# Patient Record
Sex: Female | Born: 1960 | Race: Black or African American | Hispanic: No | Marital: Single | Smoking: Current every day smoker
Health system: Southern US, Community
[De-identification: ages and names within clinical notes are randomized; demographics above are authoritative.]

## PROBLEM LIST (undated history)

## (undated) DIAGNOSIS — E119 Type 2 diabetes mellitus without complications: Secondary | ICD-10-CM

## (undated) DIAGNOSIS — E669 Obesity, unspecified: Secondary | ICD-10-CM

## (undated) DIAGNOSIS — G709 Myoneural disorder, unspecified: Secondary | ICD-10-CM

## (undated) DIAGNOSIS — F32A Depression, unspecified: Secondary | ICD-10-CM

## (undated) DIAGNOSIS — M109 Gout, unspecified: Secondary | ICD-10-CM

## (undated) DIAGNOSIS — J189 Pneumonia, unspecified organism: Secondary | ICD-10-CM

## (undated) DIAGNOSIS — J45909 Unspecified asthma, uncomplicated: Secondary | ICD-10-CM

## (undated) DIAGNOSIS — I10 Essential (primary) hypertension: Secondary | ICD-10-CM

## (undated) DIAGNOSIS — G629 Polyneuropathy, unspecified: Secondary | ICD-10-CM

## (undated) DIAGNOSIS — F329 Major depressive disorder, single episode, unspecified: Secondary | ICD-10-CM

## (undated) DIAGNOSIS — C801 Malignant (primary) neoplasm, unspecified: Secondary | ICD-10-CM

## (undated) DIAGNOSIS — M1990 Unspecified osteoarthritis, unspecified site: Secondary | ICD-10-CM

## (undated) HISTORY — DX: Gout, unspecified: M10.9

## (undated) HISTORY — DX: Major depressive disorder, single episode, unspecified: F32.9

## (undated) HISTORY — PX: MULTIPLE TOOTH EXTRACTIONS: SHX2053

## (undated) HISTORY — DX: Unspecified asthma, uncomplicated: J45.909

## (undated) HISTORY — PX: TONSILLECTOMY: SUR1361

## (undated) HISTORY — PX: FOOT SURGERY: SHX648

## (undated) HISTORY — DX: Essential (primary) hypertension: I10

## (undated) HISTORY — DX: Depression, unspecified: F32.A

## (undated) HISTORY — DX: Unspecified osteoarthritis, unspecified site: M19.90

---

## 1999-01-19 ENCOUNTER — Emergency Department (HOSPITAL_COMMUNITY): Admission: EM | Admit: 1999-01-19 | Discharge: 1999-01-19 | Payer: Self-pay | Admitting: *Deleted

## 2000-03-13 ENCOUNTER — Emergency Department (HOSPITAL_COMMUNITY): Admission: EM | Admit: 2000-03-13 | Discharge: 2000-03-13 | Payer: Self-pay | Admitting: Emergency Medicine

## 2000-03-13 ENCOUNTER — Encounter: Payer: Self-pay | Admitting: Emergency Medicine

## 2000-06-07 ENCOUNTER — Emergency Department (HOSPITAL_COMMUNITY): Admission: EM | Admit: 2000-06-07 | Discharge: 2000-06-07 | Payer: Self-pay | Admitting: Emergency Medicine

## 2000-11-06 ENCOUNTER — Emergency Department (HOSPITAL_COMMUNITY): Admission: EM | Admit: 2000-11-06 | Discharge: 2000-11-06 | Payer: Self-pay | Admitting: Emergency Medicine

## 2001-07-21 ENCOUNTER — Emergency Department (HOSPITAL_COMMUNITY): Admission: EM | Admit: 2001-07-21 | Discharge: 2001-07-21 | Payer: Self-pay | Admitting: Emergency Medicine

## 2001-08-07 ENCOUNTER — Emergency Department (HOSPITAL_COMMUNITY): Admission: EM | Admit: 2001-08-07 | Discharge: 2001-08-07 | Payer: Self-pay | Admitting: Emergency Medicine

## 2001-08-11 ENCOUNTER — Emergency Department (HOSPITAL_COMMUNITY): Admission: EM | Admit: 2001-08-11 | Discharge: 2001-08-11 | Payer: Self-pay | Admitting: Emergency Medicine

## 2002-04-22 ENCOUNTER — Emergency Department (HOSPITAL_COMMUNITY): Admission: EM | Admit: 2002-04-22 | Discharge: 2002-04-22 | Payer: Self-pay | Admitting: Emergency Medicine

## 2003-07-14 ENCOUNTER — Encounter: Admission: RE | Admit: 2003-07-14 | Discharge: 2003-07-14 | Payer: Self-pay | Admitting: Family Medicine

## 2003-07-25 ENCOUNTER — Emergency Department (HOSPITAL_COMMUNITY): Admission: EM | Admit: 2003-07-25 | Discharge: 2003-07-25 | Payer: Self-pay | Admitting: Emergency Medicine

## 2005-08-13 ENCOUNTER — Emergency Department (HOSPITAL_COMMUNITY): Admission: EM | Admit: 2005-08-13 | Discharge: 2005-08-13 | Payer: Self-pay | Admitting: Emergency Medicine

## 2006-08-07 DIAGNOSIS — E669 Obesity, unspecified: Secondary | ICD-10-CM | POA: Insufficient documentation

## 2006-08-07 DIAGNOSIS — J45909 Unspecified asthma, uncomplicated: Secondary | ICD-10-CM | POA: Insufficient documentation

## 2006-08-07 DIAGNOSIS — M1990 Unspecified osteoarthritis, unspecified site: Secondary | ICD-10-CM | POA: Insufficient documentation

## 2006-12-27 ENCOUNTER — Emergency Department (HOSPITAL_COMMUNITY): Admission: EM | Admit: 2006-12-27 | Discharge: 2006-12-27 | Payer: Self-pay | Admitting: Emergency Medicine

## 2007-02-10 ENCOUNTER — Encounter (INDEPENDENT_AMBULATORY_CARE_PROVIDER_SITE_OTHER): Payer: Self-pay | Admitting: *Deleted

## 2007-03-31 IMAGING — CR DG KNEE COMPLETE 4+V*R*
4 series · 4 of 4 positions shown · non-contrast
Comparison: None.

CLINICAL DATA: "Sink fell off wall and struck knee." Anterior right knee pain.

RIGHT KNEE - 4 VIEW  08/13/2005:

[view not recorded (1 of 4)]
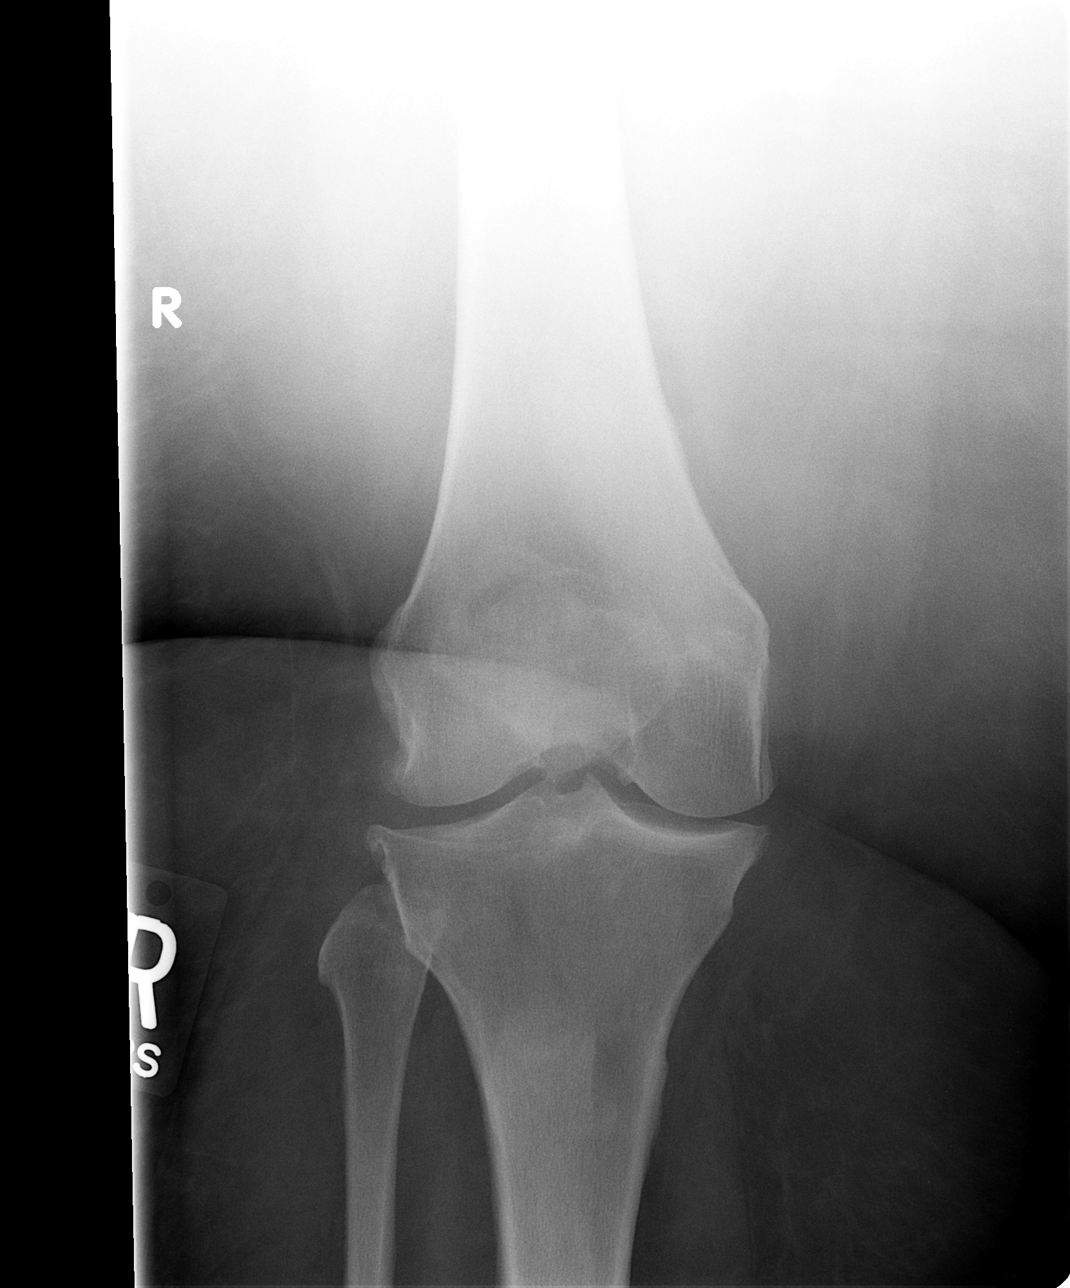

[view not recorded (2 of 4)]
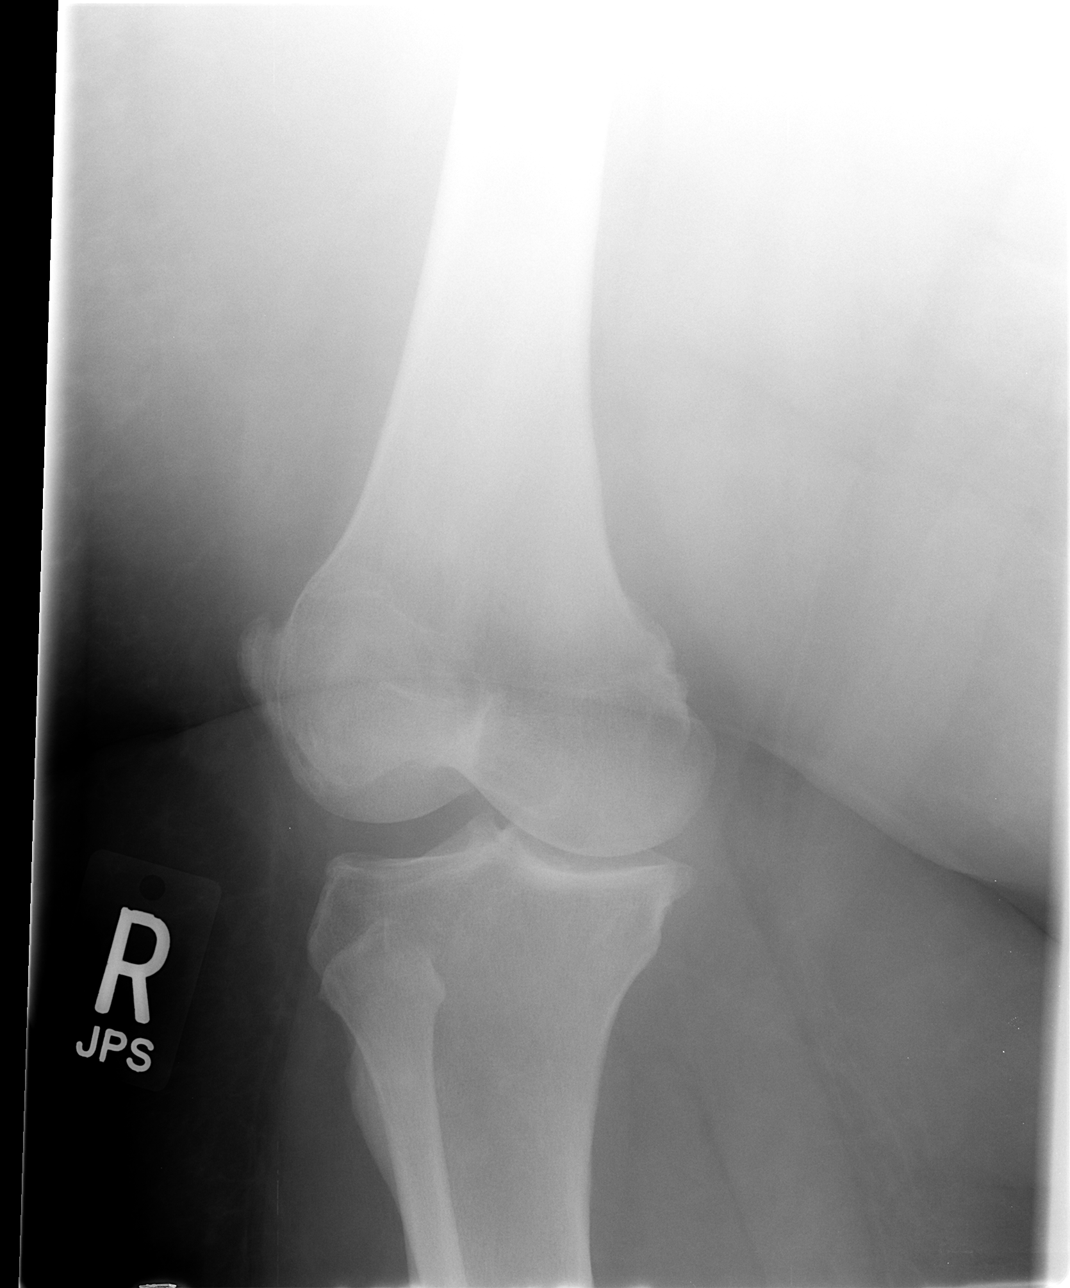

[view not recorded (3 of 4)]
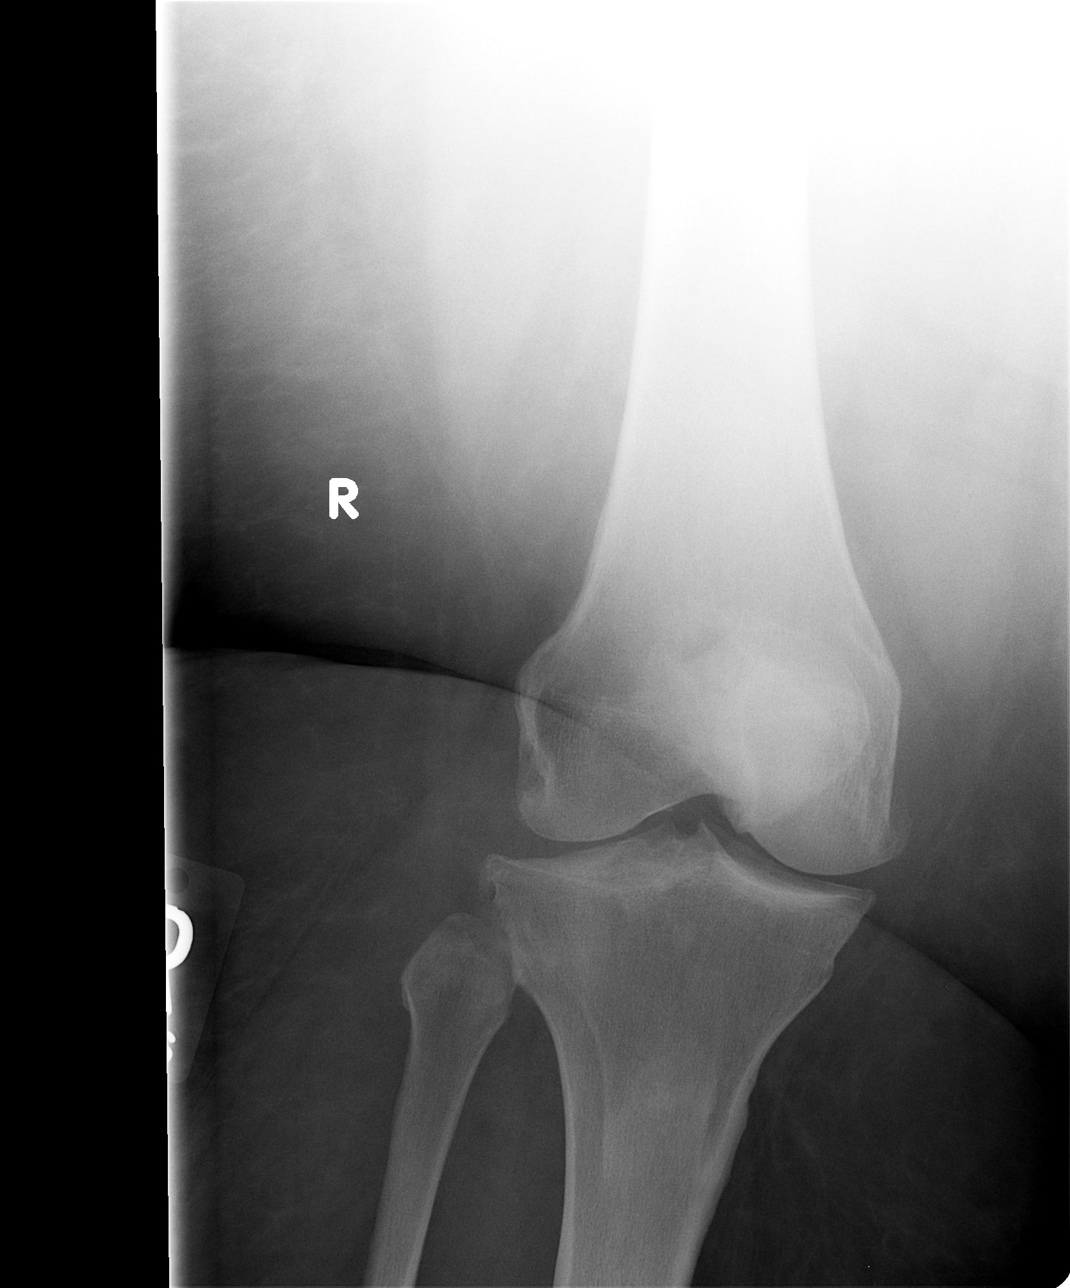

[view not recorded (4 of 4)]
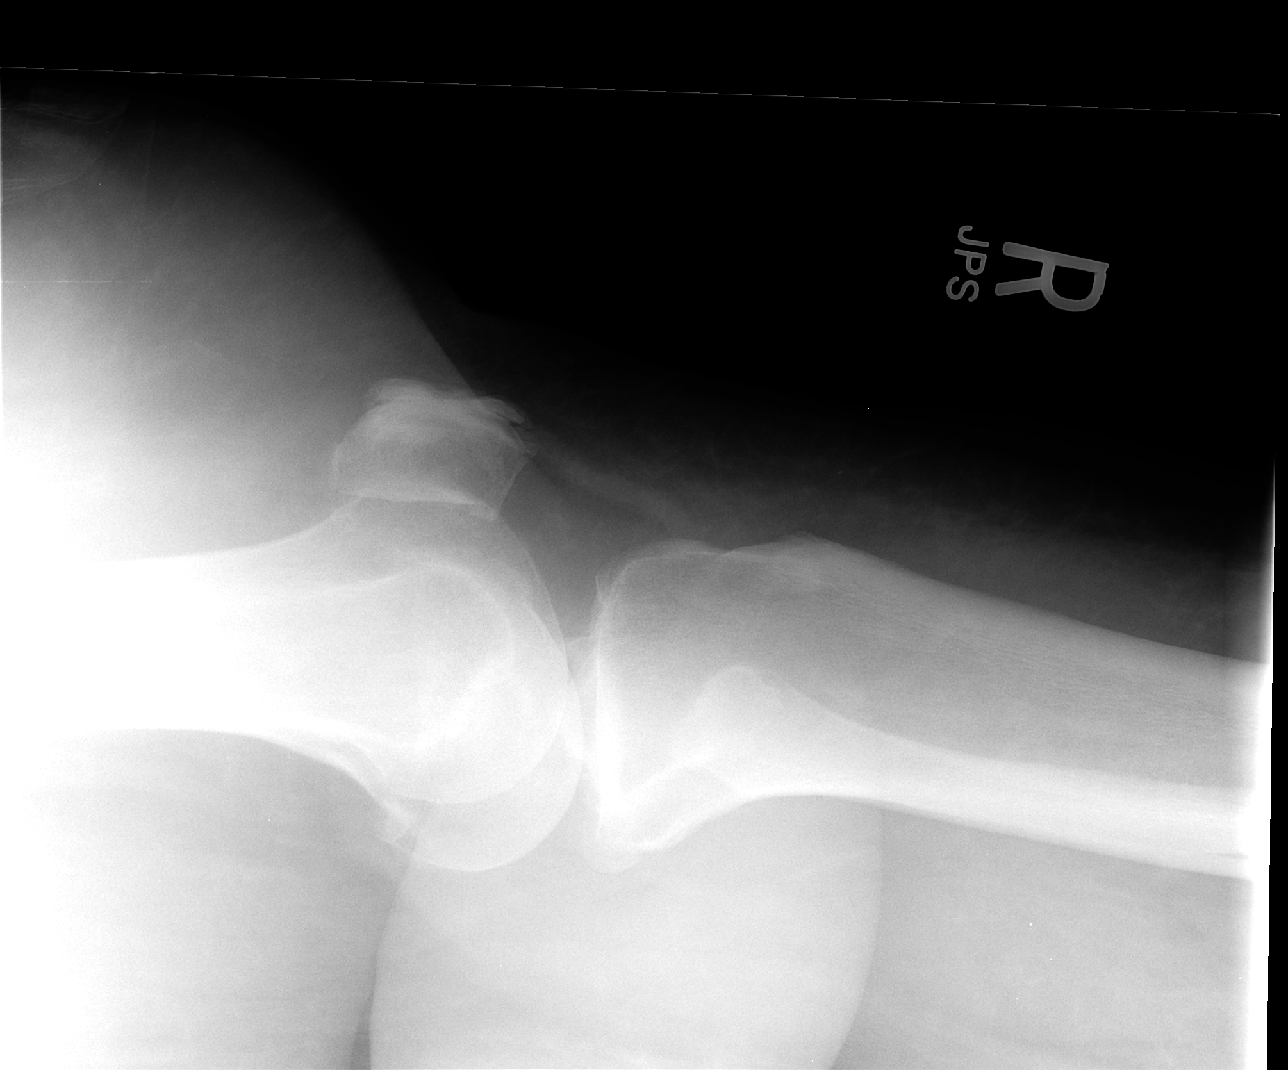

[4 of 4 positions shown; findings below may reference images not displayed]

FINDINGS: There is no evidence of acute fracture or dislocation. Moderate
medial joint space narrowing is present. The lateral and patellofemoral
compartments are well-preserved. There is hypertrophic spurring involving the
medial and lateral compartments. There is ossification at the insertion of the
quadriceps and patellar tendons on the anterior patella. There may be mild
osteopenia for age.
IMPRESSION: No acute skeletal abnormalities. Moderate osteoarthritis, particularly involving
the medial compartment.

## 2008-02-08 ENCOUNTER — Encounter: Payer: Self-pay | Admitting: *Deleted

## 2008-03-02 ENCOUNTER — Emergency Department (HOSPITAL_COMMUNITY): Admission: EM | Admit: 2008-03-02 | Discharge: 2008-03-02 | Payer: Self-pay | Admitting: Emergency Medicine

## 2008-03-07 ENCOUNTER — Telehealth: Payer: Self-pay | Admitting: *Deleted

## 2008-03-21 ENCOUNTER — Ambulatory Visit: Payer: Self-pay | Admitting: Family Medicine

## 2008-03-21 DIAGNOSIS — M549 Dorsalgia, unspecified: Secondary | ICD-10-CM | POA: Insufficient documentation

## 2008-03-21 LAB — CONVERTED CEMR LAB
BUN: 7 mg/dL (ref 6–23)
CO2: 23 meq/L (ref 19–32)
Calcium: 9.1 mg/dL (ref 8.4–10.5)
Chloride: 100 meq/L (ref 96–112)
Creatinine, Ser: 0.81 mg/dL (ref 0.40–1.20)
Glucose, Bld: 164 mg/dL — ABNORMAL HIGH (ref 70–99)
Potassium: 4.6 meq/L (ref 3.5–5.3)
Sodium: 137 meq/L (ref 135–145)

## 2008-03-23 ENCOUNTER — Ambulatory Visit: Payer: Self-pay | Admitting: Family Medicine

## 2008-03-23 ENCOUNTER — Encounter: Payer: Self-pay | Admitting: Family Medicine

## 2008-03-30 ENCOUNTER — Telehealth: Payer: Self-pay | Admitting: Family Medicine

## 2008-04-11 ENCOUNTER — Encounter (INDEPENDENT_AMBULATORY_CARE_PROVIDER_SITE_OTHER): Payer: Self-pay | Admitting: *Deleted

## 2008-04-11 ENCOUNTER — Ambulatory Visit: Payer: Self-pay | Admitting: Family Medicine

## 2008-04-12 ENCOUNTER — Ambulatory Visit: Payer: Self-pay | Admitting: Family Medicine

## 2008-07-11 ENCOUNTER — Ambulatory Visit: Payer: Self-pay | Admitting: Family Medicine

## 2008-07-11 DIAGNOSIS — E119 Type 2 diabetes mellitus without complications: Secondary | ICD-10-CM | POA: Insufficient documentation

## 2008-07-11 LAB — CONVERTED CEMR LAB: Hgb A1c MFr Bld: 7.6 %

## 2008-07-12 ENCOUNTER — Encounter: Payer: Self-pay | Admitting: Family Medicine

## 2008-07-12 LAB — CONVERTED CEMR LAB
ALT: 14 units/L (ref 0–35)
AST: 11 units/L (ref 0–37)
Albumin: 3.5 g/dL (ref 3.5–5.2)
Alkaline Phosphatase: 98 units/L (ref 39–117)
BUN: 6 mg/dL (ref 6–23)
CO2: 29 meq/L (ref 19–32)
Calcium: 9 mg/dL (ref 8.4–10.5)
Chloride: 100 meq/L (ref 96–112)
Cholesterol: 156 mg/dL (ref 0–200)
Creatinine, Ser: 0.69 mg/dL (ref 0.40–1.20)
Glucose, Bld: 169 mg/dL — ABNORMAL HIGH (ref 70–99)
HDL: 41 mg/dL (ref >39)
LDL Cholesterol: 87 mg/dL (ref 0–99)
Potassium: 4.3 meq/L (ref 3.5–5.3)
Sodium: 139 meq/L (ref 135–145)
Total Bilirubin: 0.3 mg/dL (ref 0.3–1.2)
Total CHOL/HDL Ratio: 3.8
Total Protein: 7.3 g/dL (ref 6.0–8.3)
Triglycerides: 138 mg/dL (ref <150)
VLDL: 28 mg/dL (ref 0–40)

## 2008-07-27 ENCOUNTER — Encounter: Admission: RE | Admit: 2008-07-27 | Discharge: 2008-07-27 | Payer: Self-pay | Admitting: Family Medicine

## 2008-08-08 ENCOUNTER — Encounter: Payer: Self-pay | Admitting: Family Medicine

## 2008-10-10 ENCOUNTER — Encounter: Payer: Self-pay | Admitting: *Deleted

## 2008-10-13 ENCOUNTER — Ambulatory Visit: Payer: Self-pay | Admitting: Family Medicine

## 2008-10-13 LAB — CONVERTED CEMR LAB: Hgb A1c MFr Bld: 7.5 %

## 2008-12-01 ENCOUNTER — Ambulatory Visit: Payer: Self-pay | Admitting: Family Medicine

## 2008-12-01 DIAGNOSIS — M79609 Pain in unspecified limb: Secondary | ICD-10-CM | POA: Insufficient documentation

## 2008-12-01 DIAGNOSIS — F17200 Nicotine dependence, unspecified, uncomplicated: Secondary | ICD-10-CM

## 2008-12-26 ENCOUNTER — Encounter: Admission: RE | Admit: 2008-12-26 | Discharge: 2009-03-26 | Payer: Self-pay | Admitting: Family Medicine

## 2008-12-26 ENCOUNTER — Encounter: Payer: Self-pay | Admitting: Family Medicine

## 2009-01-11 ENCOUNTER — Telehealth: Payer: Self-pay | Admitting: Family Medicine

## 2009-01-12 ENCOUNTER — Encounter: Payer: Self-pay | Admitting: Family Medicine

## 2009-01-19 ENCOUNTER — Emergency Department (HOSPITAL_COMMUNITY): Admission: EM | Admit: 2009-01-19 | Discharge: 2009-01-19 | Payer: Self-pay | Admitting: Emergency Medicine

## 2009-04-03 ENCOUNTER — Ambulatory Visit: Payer: Self-pay | Admitting: Family Medicine

## 2009-04-03 LAB — CONVERTED CEMR LAB: Hgb A1c MFr Bld: 7.2 %

## 2009-10-16 ENCOUNTER — Ambulatory Visit (HOSPITAL_COMMUNITY): Admission: RE | Admit: 2009-10-16 | Discharge: 2009-10-16 | Payer: Self-pay | Admitting: Family Medicine

## 2009-10-16 ENCOUNTER — Ambulatory Visit: Payer: Self-pay | Admitting: Family Medicine

## 2009-10-16 DIAGNOSIS — I10 Essential (primary) hypertension: Secondary | ICD-10-CM | POA: Insufficient documentation

## 2009-10-16 LAB — CONVERTED CEMR LAB: Hgb A1c MFr Bld: 6.7 %

## 2009-10-20 ENCOUNTER — Encounter: Payer: Self-pay | Admitting: Family Medicine

## 2010-02-07 ENCOUNTER — Encounter: Payer: Self-pay | Admitting: Family Medicine

## 2010-07-10 NOTE — Miscellaneous (Signed)
   Clinical Lists Changes  Problems: Changed problem from ASTHMA, UNSPECIFIED (ICD-493.90) to ASTHMA, INTERMITTENT (ICD-493.90) 

## 2010-07-10 NOTE — Assessment & Plan Note (Signed)
Summary: f/up,tcb   Vital Signs:  Patient profile:   50 year old female Height:      64 inches Weight:      400.4 pounds BMI:     68.98 Temp:     98.4 degrees F oral Pulse rate:   81 / minute BP sitting:   168 / 91  (left arm) Cuff size:   large  Vitals Entered By: Garen Grams LPN (Oct 16, 1025 11:45 AM)  Serial Vital Signs/Assessments:  Comments: 12:14 PM Manual BP: 131/96 By: Garen Grams LPN   CC: f/u Is Patient Diabetic? Yes Did you bring your meter with you today? No Pain Assessment Patient in pain? yes     Location: legs   CC:  f/u.  History of Present Illness: CHEST PAIN Location: mind chest Description: a indigestion feeling and pressure Onset: no warning.  Never with exertion.  May be related to recent use of Bee Pollen and increased weight. Better with drinking water  Symptoms Trauma: N Nausea/vomiting: N Diaphoresis: N Shortness of breath: N Cough: N Edema: N Orthopnea: N Syncope: N Indigestion: Y  Red Flags Worse with exertion: N Recent Immobility: N Cancer history: N Tearing/radiation to back: N  DIABETES Disease Monitoring Blood Sugar ranges:not checking   Polyuria:N   Visual problems:N  Medications Compliance:off meds for one week   Hypoglycemic symptoms:N  Prevention Exercise:not recently  ROS - as above PMH - Medications reviewed and updated in medication list.  Smoking Status noted in VS form          Habits & Providers  Alcohol-Tobacco-Diet     Tobacco Status: current  Current Medications (verified): 1)  Proair Hfa 108 (90 Base) Mcg/act  Aers (Albuterol Sulfate) .... 2 Inh Q6h As Needed Shortness of Breath 2)  Hydrocortisone 2.5 % Oint (Hydrocortisone) .... Apply Two Times A Day For Itching 45 Gram Tube 3)  Metformin Hcl 500 Mg Tabs (Metformin Hcl) .... Take 1 Tab Twice Daily 4)  Gabapentin 300 Mg  Caps (Gabapentin) .Marland Kitchen.. 1 By Mouth Three Times A Day  Allergies: No Known Drug Allergies  Physical  Exam  General:  Well-developed,well-nourished,in no acute distress; alert,appropriate and cooperative throughout examination very obese Lungs:  Normal respiratory effort, chest expands symmetrically. Lungs are clear to auscultation, no crackles or wheezes. Heart:  Normal rate and regular rhythm. S1 and S2 normal without gallop, murmur, click, rub or other extra sounds. Extremities:  obese.  No edema or focall tender areas    Impression & Recommendations:  Problem # 1:  CHEST PAIN (ICD-786.50) Assessment New  ECG unremarkable.  Most consistent with reflux of indigestion since not exertional helped with water and she has had recent weight gain and using excess chocolate and took Bee Pollen which upset her stomach.  No evidence for coronary artery disease or PE or vascular problems  Orders: [facility]- Est  Level 4 (25366)  Problem # 2:  DIABETES MELLITUS, WITHOUT COMPLICATIONS (ICD-250.00) stable despite her weight gain  Her updated medication list for this problem includes:    Metformin Hcl 500 Mg Tabs (Metformin hcl) .Marland Kitchen... Take 1 tab twice daily  Orders: A1C-[facility] (44034) [facility]- Est  Level 4 (74259)  Labs Reviewed: Creat: 0.69 (07/11/2008)    Reviewed HgBA1c results: 6.7 (10/16/2009)  7.2 (04/03/2009)  Problem # 3:  OBESITY, NOS (ICD-278.00) worsened with recent weight gain when stressed over her family's reaction to her girlfriend.  She aims to get back to weight loss.  Will be visited by  an Aunt who lost > 250 lbs   Problem # 4:  LEG PAIN, BILATERAL (ICD-729.5) was off neurontin and pain worsend.  Will restart   Problem # 5:  ELEVATED BP READING WITHOUT DX HYPERTENSION (ICD-796.2) Assessment: Comment Only has not had hypertension in the past.  Will monitor.  Hopefully with weight loss will resolve.  If does not will need Bmet  BP today: 168/91 Prior BP: 144/90 (04/03/2009)  Labs Reviewed: Creat: 0.69 (07/11/2008) Chol: 156 (07/11/2008)   HDL: 41 (07/11/2008)   LDL: 87  (07/11/2008)   TG: 138 (07/11/2008)  Instructed in low sodium diet (DASH Handout) and behavior modification.    Complete Medication List: 1)  Proair Hfa 108 (90 Base) Mcg/act Aers (Albuterol sulfate) .... 2 inh q6h as needed shortness of breath 2)  Hydrocortisone 2.5 % Oint (Hydrocortisone) .... Apply two times a day for itching 45 gram tube 3)  Metformin Hcl 500 Mg Tabs (Metformin hcl) .... Take 1 tab twice daily 4)  Gabapentin 300 Mg Caps (Gabapentin) .Marland Kitchen.. 1 by mouth three times a day  Other Orders: EKG- [facility] (EKG)  Patient Instructions: 1)  Please schedule a follow-up appointment in 1-2 month.  2)  Next time you have chest pain try mylanta or maalox antiacid.  If having frequenlty try Ranitadine over the counter 75 mg up to two times a day 3)  If you have chest pain with exertion or severe chest pain that does not go away after antiacid or rest more than 20-30 minutes you should call an ambulance 4)  Diet is the key to your health 5)  Colace stool softner (docusate) 6)  Restart all your medications Prescriptions: GABAPENTIN 300 MG  CAPS (GABAPENTIN) 1 by mouth three times a day  #90 x 6   Entered and Authorized by:   Pearlean Brownie MD   Signed by:   Pearlean Brownie MD on 10/16/2009   Method used:   Electronically to        [facility]  [phone]* (retail)       [address] [place], [place]  [address]       Ph: [phone] or [phone]       Fax: [phone]   RxID:   [id number] METFORMIN HCL 500 MG TABS (METFORMIN HCL) Take 1 tab twice daily  #60 x 6   Entered and Authorized by:   Pearlean Brownie MD   Signed by:   Pearlean Brownie MD on 10/16/2009   Method used:   Electronically to        [facility]  [phone]* (retail)       [address] [place], [place]  [address]       Ph: [phone] or [phone]       Fax: [phone]   RxID:   [id number] PROAIR HFA 108 (90 BASE) MCG/ACT  AERS (ALBUTEROL SULFATE) 2 inh q6h as needed  shortness of breath  #1 x 6   Entered and Authorized by:   Pearlean Brownie MD   Signed by:   Pearlean Brownie MD on 10/16/2009   Method used:   Electronically to        [facility]  [phone]* (retail)       [address] [place], [place]  [address]       Ph: [phone] or [phone]       Fax: [phone]  RxID:   [id number]   Laboratory Results   Blood Tests   Date/Time Received: Oct 16, 2009 11:37 AM  Date/Time Reported: Oct 16, 2009 11:50 AM   HGBA1C: 6.7%   (Normal Range: Non-Diabetic - 3-6%   Control Diabetic - 6-8%)  Comments: ...........test performed by...........Marland KitchenGaren Grams, LPN entered by Terese Door, CMA

## 2010-07-12 ENCOUNTER — Encounter: Payer: Self-pay | Admitting: *Deleted

## 2010-10-01 ENCOUNTER — Encounter: Payer: Self-pay | Admitting: Family Medicine

## 2010-10-01 ENCOUNTER — Ambulatory Visit (INDEPENDENT_AMBULATORY_CARE_PROVIDER_SITE_OTHER): Payer: Medicaid Other | Admitting: Family Medicine

## 2010-10-01 VITALS — BP 140/80 | HR 84 | Temp 97.8°F | Ht 63.5 in | Wt >= 6400 oz

## 2010-10-01 DIAGNOSIS — M79609 Pain in unspecified limb: Secondary | ICD-10-CM

## 2010-10-01 DIAGNOSIS — H1010 Acute atopic conjunctivitis, unspecified eye: Secondary | ICD-10-CM | POA: Insufficient documentation

## 2010-10-01 DIAGNOSIS — H1045 Other chronic allergic conjunctivitis: Secondary | ICD-10-CM

## 2010-10-01 DIAGNOSIS — E119 Type 2 diabetes mellitus without complications: Secondary | ICD-10-CM

## 2010-10-01 LAB — COMPREHENSIVE METABOLIC PANEL
Alkaline Phosphatase: 92 U/L (ref 39–117)
Glucose, Bld: 90 mg/dL (ref 70–99)
Sodium: 139 mEq/L (ref 135–145)
Total Bilirubin: 0.2 mg/dL — ABNORMAL LOW (ref 0.3–1.2)
Total Protein: 7.6 g/dL (ref 6.0–8.3)

## 2010-10-01 LAB — POCT GLYCOSYLATED HEMOGLOBIN (HGB A1C): Hemoglobin A1C: 6.8

## 2010-10-01 NOTE — Assessment & Plan Note (Signed)
Her presentation is most consistent with allergeries.   No signs of infection or iritis.    We discussed possible approaches.   Will use OTC oral antihistamine as first line treatment

## 2010-10-01 NOTE — Patient Instructions (Signed)
I will call you if your tests are not good.  Otherwise I will send you a letter.  If you do not hear from me with in 2 weeks please call our office.    Use Clariten (loratadine) 10 mg once a day for allergies Knees - weight control is the key.  Lessening your intake of calories. You need to decide how to achieve that goal.   [facility] has information on gastric bypass

## 2010-10-01 NOTE — Assessment & Plan Note (Signed)
Well controlled. Continue current treatment. 

## 2010-10-01 NOTE — Progress Notes (Signed)
  Subjective:    Patient ID: Deborah Benson, female    DOB: [birth year], 50 y.o.   MRN: [id number]  HPI  Leg Pain Continues but is worsening.  More at rest now when before was with walking.  Mostly in her knees.  No fever or swelling or redness or rash. Taking gabapentin which helps some.   Using tylenol but that does not help much  DIABETES Disease Monitoring: Blood Sugar ranges-not checking Polyuria/phagia/dipsia- no      Visual problems- eyes feel itchy and red Medications: Compliance- daly metformi Hypoglycemic symptoms- no  Eyes Feel itchy and scratchy and are red.  No visual acuity problems.  Has had this for months.  Has not tried any antihistamine.  Has appointment with any eye doctor.    Review of Systems see above  PMH - obesity has been a life long issue since a teenager    Objective:   Physical Exam    Eye - Pupils Equal Round Reactive to light, Extraocular movements intact, Fundi without hemorrhage or visible lesions, Conjunctiva are red and injected peripherally.  No discharge or photophobia Knees - FROM no detectable effusion.        Assessment & Plan:

## 2010-10-01 NOTE — Assessment & Plan Note (Signed)
Her presentation is most consistent with pain due to DJD.   No signs of infection or fracture or inflammatory arthritis.    We discussed possible approaches.  She acknowledges that her weight is the main factor and would like to meet with our health coach and will work to decrease her intake

## 2010-10-02 ENCOUNTER — Other Ambulatory Visit: Payer: Self-pay | Admitting: Family Medicine

## 2010-10-02 ENCOUNTER — Encounter: Payer: Self-pay | Admitting: Family Medicine

## 2010-10-02 ENCOUNTER — Telehealth: Payer: Self-pay | Admitting: Family Medicine

## 2010-10-02 MED ORDER — ACCU-CHEK ACTIVE CARE KIT KIT: 1.0000 | PACK | Freq: Every day | Status: DC

## 2010-10-02 NOTE — Telephone Encounter (Signed)
Pt was seen yesterday, forgot to get rx for glucose machine, would [facility]

## 2010-10-03 NOTE — Telephone Encounter (Signed)
done

## 2010-10-24 ENCOUNTER — Telehealth: Payer: Self-pay | Admitting: Family Medicine

## 2010-10-24 MED ORDER — ALBUTEROL SULFATE HFA 108 (90 BASE) MCG/ACT IN AERS: 2.0000 | INHALATION_SPRAY | Freq: Four times a day (QID) | RESPIRATORY_TRACT | Status: DC | PRN

## 2010-10-24 MED ORDER — METFORMIN HCL 500 MG PO TABS: 500.0000 mg | ORAL_TABLET | Freq: Two times a day (BID) | ORAL | Status: DC

## 2010-10-24 MED ORDER — GABAPENTIN 300 MG PO CAPS: 300.0000 mg | ORAL_CAPSULE | Freq: Three times a day (TID) | ORAL | Status: DC

## 2010-10-24 NOTE — Telephone Encounter (Signed)
Deborah Benson called on 4/24 after her appt day before to have a rx sent for glucometer sent to pharmacy.  According to note this was done the next day.  She contacted pharmacy about refills that also were suppose to have sent on date of service and they didn't have either.  She has been asked to contact them and have them escribe these requests.  Pt agreed to do so.  Giving you a heads up on this.

## 2010-10-24 NOTE — Telephone Encounter (Signed)
Attempted to call but VM was not identifiable as patients, did not leave a message Mudlogger, Maryjo Rochester

## 2010-10-24 NOTE — Telephone Encounter (Signed)
Rx sent to [facility]

## 2010-10-24 NOTE — Telephone Encounter (Signed)
Pt contacted [facility] and they said they could not escribe rxs for Metformin,Gabapentin and the albuterol because pt never had rxs filled there before.  Will need to call or fax rxs to them.  If not write rxs for pt to pick up.

## 2010-10-24 NOTE — Telephone Encounter (Signed)
FYI to MD

## 2010-11-12 ENCOUNTER — Ambulatory Visit: Payer: Medicaid Other | Admitting: Family Medicine

## 2010-11-19 ENCOUNTER — Other Ambulatory Visit: Payer: Self-pay | Admitting: Family Medicine

## 2010-12-07 ENCOUNTER — Ambulatory Visit: Payer: Medicaid Other | Admitting: Family Medicine

## 2011-03-11 LAB — URINALYSIS, ROUTINE W REFLEX MICROSCOPIC
Bilirubin Urine: NEGATIVE
Hgb urine dipstick: NEGATIVE
Nitrite: NEGATIVE
Specific Gravity, Urine: 1.006
pH: 7.5

## 2011-03-11 LAB — PREGNANCY, URINE: Preg Test, Ur: NEGATIVE

## 2011-08-30 ENCOUNTER — Encounter: Payer: Self-pay | Admitting: Family Medicine

## 2011-10-25 ENCOUNTER — Encounter: Payer: Self-pay | Admitting: Family Medicine

## 2011-11-11 ENCOUNTER — Ambulatory Visit (INDEPENDENT_AMBULATORY_CARE_PROVIDER_SITE_OTHER): Payer: Medicaid Other | Admitting: Family Medicine

## 2011-11-11 ENCOUNTER — Encounter: Payer: Self-pay | Admitting: Family Medicine

## 2011-11-11 VITALS — BP 143/87 | HR 92 | Temp 98.5°F | Ht 63.5 in | Wt >= 6400 oz

## 2011-11-11 DIAGNOSIS — G5600 Carpal tunnel syndrome, unspecified upper limb: Secondary | ICD-10-CM

## 2011-11-11 DIAGNOSIS — M79609 Pain in unspecified limb: Secondary | ICD-10-CM

## 2011-11-11 DIAGNOSIS — E119 Type 2 diabetes mellitus without complications: Secondary | ICD-10-CM

## 2011-11-11 LAB — POCT UA - MICROALBUMIN: Creatinine, POC: 100 mg/dL

## 2011-11-11 LAB — POCT GLYCOSYLATED HEMOGLOBIN (HGB A1C): Hemoglobin A1C: 7

## 2011-11-11 NOTE — Assessment & Plan Note (Signed)
New.   Symptoms very consistent with carpal tunnel.  No signs of muscle wasting.   Suggested braces and weight loss

## 2011-11-11 NOTE — Progress Notes (Signed)
  Subjective:    Patient ID: Deborah Benson, female    DOB: [birth year], 51 y.o.   MRN: [id number]  HPI DIABETES Disease Monitoring: Blood Sugar ranges-not checking Polyuria/phagia/dipsia- no      Visual problems- no  Medications: Compliance- daily metformin  Hypoglycemic symptoms- no  Knee pain Feels popping in both legs R > left.  Has pain after they pop.  No locking or soft tissue swelling or falls or acute injury.  No other joint pain.    Hand numbness Awakens from sleep with hand tinglig numb bilaterally.  Better when she shakes them.  No weakness or daytime symptoms.  No injury no other neuro symptoms of weakness or visual changes  Review of Symptoms - see HPI  PMH - Smoking status noted.       Review of Systems     Objective:   Physical Exam Knees - very obese.  No effusion or laxity that I can determine.  Mild pain around patellas Hands - good radial pulse and normal strength and muscle mass and capillary refill Diabetic Foot Check -  Appearance - no lesions, has diffuse callus thickend skin on heel and mid foot bilaterlly  Skin - no unusual pallor or redness  Monofilament testing -  Right - Great toe, medial, central, lateral ball and posterior foot intact Left - Great toe, medial, central, lateral ball and posterior foot intact        Assessment & Plan:

## 2011-11-11 NOTE — Assessment & Plan Note (Signed)
Adequate control.  Continue metformin.

## 2011-11-11 NOTE — Patient Instructions (Signed)
See an eye doctor once a year - ask them to send me letter  Consider a pap smear to detect cervical cancer  Mammogram  I will call you if your tests are not good.  Otherwise I will send you a letter.  If you do not hear from me with in 2 weeks please call our office.     Come back in 3-6 months to check your a1c  Leg exercises for the knees  Wrist splint for the carpal tunnel

## 2011-11-11 NOTE — Assessment & Plan Note (Signed)
Continued pain now in knees.  Certainly weight related but may have component of patella tracking dysfunction.  Suggested strengthening exercises and weight loss

## 2011-11-12 LAB — COMPREHENSIVE METABOLIC PANEL
ALT: 12 U/L (ref 0–35)
AST: 14 U/L (ref 0–37)
CO2: 26 mEq/L (ref 19–32)
Calcium: 9.1 mg/dL (ref 8.4–10.5)
Chloride: 102 mEq/L (ref 96–112)
Sodium: 139 mEq/L (ref 135–145)
Total Bilirubin: 0.3 mg/dL (ref 0.3–1.2)
Total Protein: 8.2 g/dL (ref 6.0–8.3)

## 2011-11-26 ENCOUNTER — Other Ambulatory Visit: Payer: Self-pay | Admitting: Family Medicine

## 2011-11-26 NOTE — Telephone Encounter (Signed)
Patient needs refills on all of her medications including the Test Strips that were sent to [facility] last week.  She has a new pharmacy, [facility] and the # is [phone].  She would like the nurse to call her when the refills have been sent.

## 2011-11-27 MED ORDER — ALBUTEROL SULFATE HFA 108 (90 BASE) MCG/ACT IN AERS: 2.0000 | INHALATION_SPRAY | Freq: Four times a day (QID) | RESPIRATORY_TRACT | Status: DC | PRN

## 2011-11-27 MED ORDER — GABAPENTIN 300 MG PO CAPS: 300.0000 mg | ORAL_CAPSULE | Freq: Every day | ORAL | Status: DC

## 2011-11-27 MED ORDER — METFORMIN HCL 500 MG PO TABS: 500.0000 mg | ORAL_TABLET | Freq: Two times a day (BID) | ORAL | Status: DC

## 2011-11-27 MED ORDER — GLUCOSE BLOOD VI STRP: ORAL_STRIP | Status: DC

## 2011-11-27 MED ORDER — ACCU-CHEK ACTIVE CARE KIT KIT: 1.0000 | PACK | Freq: Every day | Status: DC

## 2011-11-27 NOTE — Telephone Encounter (Signed)
Done. thanks

## 2011-11-27 NOTE — Telephone Encounter (Signed)
Pt is going out of town today and can't leave with out these strips.

## 2011-11-27 NOTE — Telephone Encounter (Signed)
lvm for patient that Rx's have been sent to pharmacy.

## 2011-11-27 NOTE — Telephone Encounter (Signed)
Patient is calling back to check on this.  She is on her way out of town and needs her meds before she goes.

## 2012-03-18 ENCOUNTER — Ambulatory Visit (INDEPENDENT_AMBULATORY_CARE_PROVIDER_SITE_OTHER): Payer: Medicaid Other | Admitting: Family Medicine

## 2012-03-18 ENCOUNTER — Encounter: Payer: Self-pay | Admitting: Family Medicine

## 2012-03-18 VITALS — BP 156/87 | HR 83 | Temp 98.3°F | Ht 63.5 in | Wt >= 6400 oz

## 2012-03-18 DIAGNOSIS — Z23 Encounter for immunization: Secondary | ICD-10-CM

## 2012-03-18 DIAGNOSIS — E669 Obesity, unspecified: Secondary | ICD-10-CM

## 2012-03-18 DIAGNOSIS — M1990 Unspecified osteoarthritis, unspecified site: Secondary | ICD-10-CM

## 2012-03-18 DIAGNOSIS — E119 Type 2 diabetes mellitus without complications: Secondary | ICD-10-CM

## 2012-03-18 NOTE — Patient Instructions (Addendum)
You need Flu Shot Mammogram Colon Cancer screening - colonoscopy or stool cards Pap Smear - make an appointment in 3 months   [facility] - for information on Lapband or bypass  Your main goal is weight loss

## 2012-03-18 NOTE — Assessment & Plan Note (Signed)
Stable Continue medications and hopeflly weight loss

## 2012-03-18 NOTE — Assessment & Plan Note (Signed)
Weight related with component of patella dysfunction.  Can't recommend any treatments other than weight control at present time.  She understands

## 2012-03-18 NOTE — Assessment & Plan Note (Signed)
Worsening.  Discussed approaches to intake control.  Suggested diet, nutritionist and consider surgery

## 2012-03-18 NOTE — Progress Notes (Signed)
  Subjective:    Patient ID: Deborah Benson, female    DOB: [birth year], 51 y.o.   MRN: [id number]  HPI  Knee ankle pain Continued pain especially iwht walking.  Feels things slip out of place.  No redness or focal swelling or fevers.  Has gained weight.  Not able to do quad exercises  DIABETES Disease Monitoring: Blood Sugar ranges-not checking Polyuria/phagia/dipsia- no      Visual problems- no  Medications: Compliance- daily metformin Hypoglycemic symptoms- no  Obesity Has gained weight.  Unable to exercise due to knee pain.  Not decreasing intake much.  Feel hungry all the timed.  Did lose > 25 lbs last year.  Last saw Dr Gerilyn Pilgrim about a year ago.  Consdering surgery for weight control.  Review of Symptoms - see HPI  PMH - Smoking status noted.      Review of Systems     Objective:   Physical Exam Knees - morbidly obese.  Reasonable range of motion of hips and knees.  No discernable effusion Unable to track patella very well  Ankles - no definite effuision.  Focally tender over left lateral mid foot without deformity or crepitus       Assessment & Plan:

## 2012-05-14 ENCOUNTER — Other Ambulatory Visit: Payer: Self-pay | Admitting: Family Medicine

## 2012-07-10 ENCOUNTER — Other Ambulatory Visit: Payer: Self-pay | Admitting: Family Medicine

## 2012-07-27 ENCOUNTER — Ambulatory Visit (INDEPENDENT_AMBULATORY_CARE_PROVIDER_SITE_OTHER): Payer: Medicaid Other | Admitting: Family Medicine

## 2012-07-27 ENCOUNTER — Encounter: Payer: Self-pay | Admitting: Family Medicine

## 2012-07-27 VITALS — BP 143/81 | HR 91 | Temp 98.1°F | Ht 63.5 in | Wt >= 6400 oz

## 2012-07-27 DIAGNOSIS — E669 Obesity, unspecified: Secondary | ICD-10-CM

## 2012-07-27 DIAGNOSIS — Z1211 Encounter for screening for malignant neoplasm of colon: Secondary | ICD-10-CM

## 2012-07-27 DIAGNOSIS — E119 Type 2 diabetes mellitus without complications: Secondary | ICD-10-CM

## 2012-07-27 DIAGNOSIS — M1990 Unspecified osteoarthritis, unspecified site: Secondary | ICD-10-CM

## 2012-07-27 LAB — POCT GLYCOSYLATED HEMOGLOBIN (HGB A1C): Hemoglobin A1C: 7

## 2012-07-27 MED ORDER — GABAPENTIN (ONCE-DAILY) 600 MG PO TABS: 1.0000 | ORAL_TABLET | Freq: Every day | ORAL | Status: DC

## 2012-07-27 NOTE — Assessment & Plan Note (Signed)
Stable.  Discussed best therapy is significant weight loss

## 2012-07-27 NOTE — Assessment & Plan Note (Signed)
Well controlled 

## 2012-07-27 NOTE — Progress Notes (Signed)
  Subjective:    Patient ID: Deborah Benson, female    DOB: June 23, 1960, 52 y.o.   MRN: [id number]  HPI  DIABETES Disease Monitoring: Blood Sugar ranges-not checking Polyuria/phagia/dipsia- no      Visual problems- no Medications: Compliance- daily metformin Hypoglycemic symptoms- no  Obesity Has not been able to lose much weight but has not gained.  Is interested in weight loss surgery but her mom is against it.  She feels she has done all she can to lose weight on her own.  Understands her leg pain will only worsen  Leg Foot pain About the same.  Pain in her lateral feet.  Not using insoles. No sores or soft tissue swelling or redness beter with rest  Review of Systems     Objective:   Physical Exam Alert very obese Heart - Regular rate and rhythm.  No murmurs, gallops or rubs.    Lungs:  Normal respiratory effort, chest expands symmetrically. Lungs are clear to auscultation, no crackles or wheezes. Diabetic Foot Check -  Appearance - no lesions, ulcers or calluses Skin - no unusual pallor or redness Monofilament testing -  Right - Great toe, medial, central, lateral ball and posterior foot intact Left - Great toe, medial, central, lateral ball and posterior foot intact        Assessment & Plan:

## 2012-07-27 NOTE — Assessment & Plan Note (Signed)
Unchanged.  Her primay problem.  Encouraged exploration of weight loss surgery

## 2012-07-27 NOTE — Patient Instructions (Addendum)
See your eye doctor ask them to send me a report   Send in your stool cards  You need a pap smear to prevent cervical cancer   Contact [facility] about weight loss surgery  Come back in June for a check up

## 2012-08-04 ENCOUNTER — Other Ambulatory Visit (HOSPITAL_COMMUNITY): Payer: Self-pay | Admitting: Emergency Medicine

## 2012-08-04 DIAGNOSIS — Z1231 Encounter for screening mammogram for malignant neoplasm of breast: Secondary | ICD-10-CM

## 2012-08-10 ENCOUNTER — Ambulatory Visit (HOSPITAL_COMMUNITY): Payer: Medicaid Other | Attending: Emergency Medicine

## 2012-08-21 ENCOUNTER — Ambulatory Visit (HOSPITAL_COMMUNITY): Payer: Medicaid Other | Attending: Emergency Medicine

## 2012-08-27 LAB — POC HEMOCCULT BLD/STL (HOME/3-CARD/SCREEN): Card #2 Fecal Occult Blod, POC: NEGATIVE

## 2012-08-27 NOTE — Addendum Note (Signed)
Addended by: [place], Rosali Augello on: 08/27/2012 05:25 PM   Modules accepted: Orders

## 2012-09-12 ENCOUNTER — Other Ambulatory Visit: Payer: Self-pay | Admitting: Family Medicine

## 2012-12-17 ENCOUNTER — Other Ambulatory Visit: Payer: Self-pay

## 2013-01-25 ENCOUNTER — Ambulatory Visit
Admission: RE | Admit: 2013-01-25 | Discharge: 2013-01-25 | Disposition: A | Discharge status: Home / Self Care | Payer: Medicaid Other | Source: Ambulatory Visit | Attending: Emergency Medicine | Admitting: Emergency Medicine

## 2013-01-25 DIAGNOSIS — Z1231 Encounter for screening mammogram for malignant neoplasm of breast: Secondary | ICD-10-CM

## 2013-02-08 ENCOUNTER — Other Ambulatory Visit: Payer: Self-pay | Admitting: Family Medicine

## 2013-03-08 ENCOUNTER — Ambulatory Visit (INDEPENDENT_AMBULATORY_CARE_PROVIDER_SITE_OTHER): Payer: Medicaid Other | Admitting: Family Medicine

## 2013-03-08 ENCOUNTER — Encounter: Payer: Self-pay | Admitting: Family Medicine

## 2013-03-08 VITALS — BP 144/84 | HR 82 | Temp 98.5°F | Ht 63.5 in | Wt >= 6400 oz

## 2013-03-08 DIAGNOSIS — E119 Type 2 diabetes mellitus without complications: Secondary | ICD-10-CM

## 2013-03-08 DIAGNOSIS — Z23 Encounter for immunization: Secondary | ICD-10-CM

## 2013-03-08 DIAGNOSIS — G4700 Insomnia, unspecified: Secondary | ICD-10-CM | POA: Insufficient documentation

## 2013-03-08 DIAGNOSIS — F17200 Nicotine dependence, unspecified, uncomplicated: Secondary | ICD-10-CM

## 2013-03-08 MED ORDER — GABAPENTIN 300 MG PO CAPS: 600.0000 mg | ORAL_CAPSULE | Freq: Every day | ORAL | Status: DC

## 2013-03-08 NOTE — Assessment & Plan Note (Signed)
Seems related to depressive symptoms which have been present about 1 month.  Discussed possible interventions and she would like to see how it goes and work on sleep hygiene.  Givne her weight sleep apnea needs to be considered if does not resolve

## 2013-03-08 NOTE — Assessment & Plan Note (Signed)
Cutting down - using Medtr[employer]

## 2013-03-08 NOTE — Assessment & Plan Note (Signed)
Well controlled 

## 2013-03-08 NOTE — Patient Instructions (Addendum)
Nice to see you today  Please ask your eye doctor to send me a report  Use Terbenafine cream (Lamisil) twice daily on your right foot for 6 weeks  Work hard on a sleep schedule - same time to bed and get up every day and no naps  Regular exercise but not just before bed  If your mood and sleep is not better in 3 weeks or if worsening come back  You need a pap smear   Great work with the decreasing smoking - your quit date is January 2015  Come back in 3-6 mo

## 2013-03-08 NOTE — Progress Notes (Signed)
  Subjective:    Patient ID: Deborah Benson, female    DOB: [birth year], 52 y.o.   MRN: [id number]  HPI  DIABETES Disease Monitoring: Blood Sugar ranges-not checking Polyuria/phagia/dipsia- no      Visual problems- no Medications: Compliance- daily metformin Hypoglycemic symptoms- no  Feeling Down Insomnia For last month having early morning awakening, feeling low, anhedonia (see PHQ-9) Started when her son moved out with hisfamily which had been very stressful.  No suicidal ideation. Does snore.  Not taking any otc medications, or drug use or alcohol.    ROS See HPI above   PMH Smoking Status noted  Lab Review   Potassium  Date Value Range Status  11/11/2011 4.3  3.5 - 5.3 mEq/L Final     Sodium  Date Value Range Status  11/11/2011 139  135 - 145 mEq/L Final     Creat  Date Value Range Status  11/11/2011 0.68  0.50 - 1.10 mg/dL Final     Creatinine, Ser  Date Value Range Status  07/11/2008 0.69  0.40-1.20 mg/dL Final      Review of Symptoms - see HPI  PMH - Smoking status noted.      Review of Systems     Objective:   Physical Exam  Alert interactive well groomed Heart - Regular rate and rhythm.  No murmurs, gallops or rubs.    Lungs:  Normal respiratory effort, chest expands symmetrically. Lungs are clear to auscultation, no crackles or wheezes. Feet - area of flaking cracked skin on poster right foot Diabetic Foot Check -  Skin - no unusual pallor or redness Monofilament testing  Right - Great toe, medial, central, lateral ball and posterior foot intact Left - Great toe, medial, central, lateral ball and posterior foot intact       Assessment & Plan:

## 2013-03-12 ENCOUNTER — Other Ambulatory Visit: Payer: Self-pay | Admitting: Family Medicine

## 2013-03-23 ENCOUNTER — Telehealth: Payer: Self-pay | Admitting: *Deleted

## 2013-03-23 NOTE — Telephone Encounter (Signed)
Called and gave NPI number to [facility] for upcoming appointment for Deborah Benson

## 2013-08-11 ENCOUNTER — Other Ambulatory Visit: Payer: Self-pay | Admitting: Family Medicine

## 2013-08-11 ENCOUNTER — Ambulatory Visit (INDEPENDENT_AMBULATORY_CARE_PROVIDER_SITE_OTHER): Payer: Medicaid Other | Admitting: Family Medicine

## 2013-08-11 ENCOUNTER — Encounter: Payer: Self-pay | Admitting: Family Medicine

## 2013-08-11 VITALS — BP 153/86 | HR 71 | Temp 98.0°F | Wt >= 6400 oz

## 2013-08-11 DIAGNOSIS — E119 Type 2 diabetes mellitus without complications: Secondary | ICD-10-CM

## 2013-08-11 DIAGNOSIS — K5900 Constipation, unspecified: Secondary | ICD-10-CM

## 2013-08-11 LAB — LIPID PANEL
Cholesterol: 153 mg/dL (ref 0–200)
HDL: 46 mg/dL (ref >39)
LDL Cholesterol: 84 mg/dL (ref 0–99)
Total CHOL/HDL Ratio: 3.3 Ratio
Triglycerides: 116 mg/dL (ref <150)
VLDL: 23 mg/dL (ref 0–40)

## 2013-08-11 LAB — BASIC METABOLIC PANEL
BUN: 7 mg/dL (ref 6–23)
CALCIUM: 9.2 mg/dL (ref 8.4–10.5)
CO2: 32 meq/L (ref 19–32)
CREATININE: 0.66 mg/dL (ref 0.50–1.10)
Chloride: 99 mEq/L (ref 96–112)
GLUCOSE: 114 mg/dL — AB (ref 70–99)
Potassium: 4.2 mEq/L (ref 3.5–5.3)
SODIUM: 137 meq/L (ref 135–145)

## 2013-08-11 LAB — POCT GLYCOSYLATED HEMOGLOBIN (HGB A1C): HEMOGLOBIN A1C: 7.1

## 2013-08-11 MED ORDER — LISINOPRIL 10 MG PO TABS: 10.0000 mg | ORAL_TABLET | Freq: Every day | ORAL | Status: DC

## 2013-08-11 NOTE — Patient Instructions (Addendum)
Good to see you today!  Thanks for coming in.  Take the lisinopril once a day - if any lip swellig stop and call me  I will call you if your lab tests are not normal.  Otherwise we will discuss them at your next visit.  Come back in 1-2 weeks

## 2013-08-11 NOTE — Progress Notes (Signed)
Subjective:     Patient ID: Deborah Benson, female   DOB: May 24, 1961, 53 y.o.   MRN: [id number]  HPI:  Deborah Benson is a 53 yo female with a history of T2DM, high blood pressure, tobacco use, depression, and chronic leg pain, that presents for a check up.  She also has a 1-week history of constipation.  Diabetes:  Most recent HbA1c (today) is 7.1, which is elevated slightly from her last reading of 6.7  She admits to poor diet high in fat, cholesterol, carbs and calories), no exercise, and infrequent use of the Metformin.  HTN:  Had high blood pressure in the past, mostly in the pre-hypertensive range.  Weight loss efforts, changes in diet have not been successful in altering blood pressure.  Today BP 153/86.   Occasional non-severe headaches, no sweating or palpitations.    Depression:  Over the last 3 months, feeling excessively sad and moody.  Admits to being irritable most days.  Only able to sleep about 3 hours (10 pm-1 am) each night.  Is awake from 1 am - 9 am, during which she eats a lot of junk food.  Takes a brief nap at 9 am.  No anhedonia.  Denies suicidal thoughts.  Leg Pain/Numbness:  Has chronic bilateral leg pain (mostly in the knees), for which she takes Gabapentin.  Gabapentin has helped relieve her symptoms, as the pain is less severe and not constant anymore.  In general, leg pain worse in the morning and her joints are stiff.  Over the last few weeks, have noticed decreased sensation and numbness in the R thigh.  Placed a pin in her thigh and did not feel it.     Constipation:  Was increasingly constipated about 1 week ago, associated with a vague bilateral lower abdominal pain.  Normally, this is relieved by eat cereal, but this did not work.  Took Metamucil and ginger ale, and was able to have a bowel movement ane.  BM relieved the pain.  No blood in the stool, or changes in the appearance or consistency of the stool at baseline.  No N/V, recent fevers, or sick contacts.  Today, she  feels the "buildup coming again," which is not a normal feeling for her.     Review of Systems Negative for daytime sleepiness, snoring.  Otherwise, as reported in the HPI.    Objective:   Physical Exam Foot:  No necrosis or ulcers Neuro:  5+ strength in lower extremities, decreased    Assessment:     Deborah Benson is a 53 yo female with a history of T2DM, high blood pressure, tobacco use, depression, and chronic leg pain, that presents for a check up.  She also has a 1-week history of constipation. As far as her diabetes, her glycemic control is good (A1c of 7.1), although it has been rising.  She needs to be counseled on the importance of diet, exercise, and medication compliance in the management of her condition.  Her leg numbness is probably secondary to diabetic neuropathy.  Consider starting on a statin, pending the results of her lipid testing (last checked in 2011, LDL 87).  Consider starting on low dose aspirin to mitigate stroke risk.  Given her history of high blood pressure refractory to attempts to modify lifestyle, start on ACE Inhibitor.  She has stage 1 hypertension.  Get TSH to rule out hypothyroidism as a cause of her constipation.   Finally, give the chronic nature of her depression and her denial  of suicidal intentions, follow-up in 1-2 weeks.  Will defer other general health maintenance recommendations (colonoscopy, etc) to this visit.      Plan:     1. T2DM/HTN  -Check labs:  CMP, lipid panel, creatinine   -Start Lisinopril to try to maintain BP < 130/80   2. Depression:    -PHQ-9 test  - F/U in 2 weeks to discuss treatment option  3. Constipation  -Check TSH level  -Continue home use of Metamucil for symptomatic releif  I reviewed and corrrected Deborah Benson note and was present and performed the key history and physical events  CHAMBLISS,MARSHALL L

## 2013-08-12 LAB — TSH: TSH: 1.923 u[IU]/mL (ref 0.350–4.500)

## 2013-08-25 ENCOUNTER — Ambulatory Visit: Payer: Medicaid Other | Admitting: Family Medicine

## 2013-10-08 ENCOUNTER — Other Ambulatory Visit: Payer: Self-pay | Admitting: Family Medicine

## 2014-08-08 ENCOUNTER — Other Ambulatory Visit: Payer: Self-pay

## 2014-08-08 ENCOUNTER — Ambulatory Visit
Admission: RE | Admit: 2014-08-08 | Discharge: 2014-08-08 | Disposition: A | Discharge status: Home / Self Care | Payer: Medicaid Other | Source: Ambulatory Visit

## 2014-08-08 DIAGNOSIS — Z1231 Encounter for screening mammogram for malignant neoplasm of breast: Secondary | ICD-10-CM

## 2014-08-10 ENCOUNTER — Ambulatory Visit (INDEPENDENT_AMBULATORY_CARE_PROVIDER_SITE_OTHER): Payer: Medicaid Other | Admitting: Family Medicine

## 2014-08-10 ENCOUNTER — Encounter: Payer: Self-pay | Admitting: Family Medicine

## 2014-08-10 VITALS — BP 141/88 | HR 74 | Temp 98.5°F | Wt >= 6400 oz

## 2014-08-10 DIAGNOSIS — Z658 Other specified problems related to psychosocial circumstances: Secondary | ICD-10-CM

## 2014-08-10 DIAGNOSIS — E119 Type 2 diabetes mellitus without complications: Secondary | ICD-10-CM

## 2014-08-10 DIAGNOSIS — E139 Other specified diabetes mellitus without complications: Secondary | ICD-10-CM

## 2014-08-10 DIAGNOSIS — F439 Reaction to severe stress, unspecified: Secondary | ICD-10-CM | POA: Insufficient documentation

## 2014-08-10 DIAGNOSIS — I10 Essential (primary) hypertension: Secondary | ICD-10-CM

## 2014-08-10 DIAGNOSIS — E1165 Type 2 diabetes mellitus with hyperglycemia: Secondary | ICD-10-CM

## 2014-08-10 LAB — POCT GLYCOSYLATED HEMOGLOBIN (HGB A1C): HEMOGLOBIN A1C: 7.1

## 2014-08-10 LAB — CBC
HEMATOCRIT: 42.5 % (ref 36.0–46.0)
Hemoglobin: 13.7 g/dL (ref 12.0–15.0)
MCH: 27.6 pg (ref 26.0–34.0)
MCHC: 32.2 g/dL (ref 30.0–36.0)
MCV: 85.5 fL (ref 78.0–100.0)
MPV: 9 fL (ref 8.6–12.4)
Platelets: 248 10*3/uL (ref 150–400)
RBC: 4.97 MIL/uL (ref 3.87–5.11)
RDW: 15.9 % — ABNORMAL HIGH (ref 11.5–15.5)
WBC: 6 10*3/uL (ref 4.0–10.5)

## 2014-08-10 MED ORDER — ALBUTEROL SULFATE HFA 108 (90 BASE) MCG/ACT IN AERS: INHALATION_SPRAY | RESPIRATORY_TRACT | Status: DC

## 2014-08-10 NOTE — Assessment & Plan Note (Signed)
Not at goal recommend daily lisinopril Will check labs

## 2014-08-10 NOTE — Assessment & Plan Note (Signed)
At [place] metformin counseled about weight reduction

## 2014-08-10 NOTE — Progress Notes (Signed)
   Subjective:    Patient ID: Deborah Benson, female    DOB: December 25, 1960, 54 y.o.   MRN: [id number]  HPI HYPERTENSION Disease Monitoring: Blood pressure range-not checking Chest pain, palpitations- no      Dyspnea- no Medications: Compliance- taking lisinopril 1-2 x a week Lightheadedness,Syncope- occasional feels dizzy.  No syncope, lasts only seconds no vertigo   Edema- trace  DIABETES Disease Monitoring: Blood Sugar ranges-not checking Polyuria/phagia/dipsia- no      Visual problems- no Medications: Compliance- daily metformin  Hypoglycemic symptoms- no  Stress Father died about a year ago.  Feeling down and not motivated.  No suicidal ideation Not taking any medication  Smoking Continues but is cutting down    Monitoring Labs and Parameters Last A1C:  Lab Results  Component Value Date   HGBA1C 7.1 08/10/2014    Last Lipid:     Component Value Date/Time   CHOL 153 08/11/2013 1051   HDL 46 08/11/2013 1051    Last Bmet  POTASSIUM  Date Value Ref Range Status  08/11/2013 4.2 3.5 - 5.3 mEq/L Final   SODIUM  Date Value Ref Range Status  08/11/2013 137 135 - 145 mEq/L Final   CREAT  Date Value Ref Range Status  08/11/2013 0.66 0.50 - 1.10 mg/dL Final   CREATININE, SER  Date Value Ref Range Status  07/11/2008 0.69 0.40-1.20 mg/dL Final      Last BPs:  BP Readings from Last 3 Encounters:  08/10/14 141/88  08/11/13 153/86  03/08/13 144/84    Chief Complaint noted Review of Symptoms - see HPI PMH - Smoking status noted.   Vital Signs reviewed     Review of Systems     Objective:   Physical Exam  Alert no acute distress Heart - Regular rate and rhythm.  No murmurs, gallops or rubs.    Lungs:  Normal respiratory effort, chest expands symmetrically. Lungs are clear to auscultation, no crackles or wheezes. Morbidly obese       Assessment & Plan:

## 2014-08-10 NOTE — Assessment & Plan Note (Signed)
May be grief reaction - patient was late so did not fully explore.  Will return for follow up

## 2014-08-10 NOTE — Patient Instructions (Signed)
Good to see you today!  Thanks for coming in.  Please take your lisinopril every day.  Get a blood pressure cuff and take and write down your reading and bring them in  I will call you if your lab tests are not normal.  Otherwise we will discuss them at your next visit.  Come back in 2-3 weeks for follow up   As always work on smoking - Consider a stop date

## 2014-08-11 LAB — BASIC METABOLIC PANEL
BUN: 8 mg/dL (ref 6–23)
CO2: 29 mEq/L (ref 19–32)
Calcium: 8.8 mg/dL (ref 8.4–10.5)
Chloride: 101 mEq/L (ref 96–112)
Creat: 0.64 mg/dL (ref 0.50–1.10)
Glucose, Bld: 116 mg/dL — ABNORMAL HIGH (ref 70–99)
POTASSIUM: 4.3 meq/L (ref 3.5–5.3)
Sodium: 138 mEq/L (ref 135–145)

## 2014-08-24 ENCOUNTER — Ambulatory Visit (INDEPENDENT_AMBULATORY_CARE_PROVIDER_SITE_OTHER): Payer: Medicaid Other | Admitting: Family Medicine

## 2014-08-24 ENCOUNTER — Encounter: Payer: Self-pay | Admitting: Family Medicine

## 2014-08-24 VITALS — BP 155/88 | HR 65 | Temp 98.3°F | Wt >= 6400 oz

## 2014-08-24 DIAGNOSIS — G4700 Insomnia, unspecified: Secondary | ICD-10-CM

## 2014-08-24 NOTE — Progress Notes (Signed)
   Subjective:    Patient ID: Deborah Benson, female    DOB: 1961/05/07, 54 y.o.   MRN: [id number]  HPI  Depression/Insomnia Main problem is trouble with sleep.  Has notice mild anhedonia but still enjoys many things including gambling.  Good appetite, minimal occasional depression.  No suicidal ideation  She goes to bed about 8 often sleeps in till 10 with long periods of not sleeping in between. No weight loss or anxiety or hair changes  Chief Complaint noted Review of Symptoms - see HPI PMH - Smoking status noted.   Vital Signs reviewed    Review of Systems     Objective:   Physical Exam Alert no acute distress     Assessment & Plan:

## 2014-08-24 NOTE — Assessment & Plan Note (Signed)
Worsened.  I do not think she has a significant mood disorder.   Seems to be mainly poorsleep hygiene.  Came up with approach. See AVS

## 2014-08-24 NOTE — Patient Instructions (Addendum)
Good to see you today!  Thanks for coming in.  I think restructing your sleep is the key  Get up every day 8 AM - whether you sleep or not  Go to bed every night at 11   If you wake up do something boring until you feel sleepy then go back to bed  No naps  If you stick to this in 3-4 weeks you will sleep better  Come back in 3 months  [facility]

## 2014-10-20 ENCOUNTER — Other Ambulatory Visit: Payer: Self-pay | Admitting: Family Medicine

## 2014-10-26 ENCOUNTER — Telehealth: Payer: Self-pay | Admitting: Family Medicine

## 2014-10-26 NOTE — Telephone Encounter (Signed)
Needs new blood sugar meter. Hers just keep saying Error [facility] [address] Needs refill on gabapentin, metaformin

## 2014-10-28 MED ORDER — GABAPENTIN 300 MG PO CAPS: 600.0000 mg | ORAL_CAPSULE | Freq: Every day | ORAL | Status: DC

## 2014-10-28 MED ORDER — METFORMIN HCL 500 MG PO TABS: 500.0000 mg | ORAL_TABLET | Freq: Two times a day (BID) | ORAL | Status: DC

## 2014-10-28 NOTE — Telephone Encounter (Signed)
Since she is only on metformin and has a good A1c she does not need to check her blood sugar.

## 2014-10-28 NOTE — Telephone Encounter (Signed)
Pt voiced understanding and will plan to follow up in mid June for her DM. Johnney Ou

## 2014-12-05 ENCOUNTER — Other Ambulatory Visit: Payer: Self-pay

## 2015-03-02 ENCOUNTER — Other Ambulatory Visit: Payer: Self-pay | Admitting: Family Medicine

## 2015-04-29 ENCOUNTER — Encounter (HOSPITAL_COMMUNITY): Payer: Self-pay | Admitting: *Deleted

## 2015-04-29 ENCOUNTER — Emergency Department (HOSPITAL_COMMUNITY)
Admission: EM | Admit: 2015-04-29 | Discharge: 2015-04-29 | Disposition: A | Discharge status: Home / Self Care | Payer: Medicaid Other | Attending: Emergency Medicine | Admitting: Emergency Medicine

## 2015-04-29 ENCOUNTER — Emergency Department (HOSPITAL_COMMUNITY): Payer: Medicaid Other

## 2015-04-29 DIAGNOSIS — Z8669 Personal history of other diseases of the nervous system and sense organs: Secondary | ICD-10-CM | POA: Insufficient documentation

## 2015-04-29 DIAGNOSIS — R2232 Localized swelling, mass and lump, left upper limb: Secondary | ICD-10-CM | POA: Diagnosis not present

## 2015-04-29 DIAGNOSIS — F17210 Nicotine dependence, cigarettes, uncomplicated: Secondary | ICD-10-CM | POA: Diagnosis not present

## 2015-04-29 DIAGNOSIS — E119 Type 2 diabetes mellitus without complications: Secondary | ICD-10-CM | POA: Diagnosis not present

## 2015-04-29 DIAGNOSIS — Z79899 Other long term (current) drug therapy: Secondary | ICD-10-CM | POA: Diagnosis not present

## 2015-04-29 DIAGNOSIS — M79642 Pain in left hand: Secondary | ICD-10-CM | POA: Insufficient documentation

## 2015-04-29 DIAGNOSIS — Z880 Allergy status to penicillin: Secondary | ICD-10-CM | POA: Diagnosis not present

## 2015-04-29 DIAGNOSIS — E669 Obesity, unspecified: Secondary | ICD-10-CM | POA: Insufficient documentation

## 2015-04-29 HISTORY — DX: Type 2 diabetes mellitus without complications: E11.9

## 2015-04-29 HISTORY — DX: Obesity, unspecified: E66.9

## 2015-04-29 HISTORY — DX: Polyneuropathy, unspecified: G62.9

## 2015-04-29 MED ORDER — INDOMETHACIN 50 MG PO CAPS: 50.0000 mg | ORAL_CAPSULE | Freq: Three times a day (TID) | ORAL | Status: DC

## 2015-04-29 MED ORDER — VANCOMYCIN HCL IN DEXTROSE 1-5 GM/200ML-% IV SOLN
1000.0000 mg | Freq: Once | INTRAVENOUS | Status: AC
  Administered 2015-04-29: 1000 mg via INTRAVENOUS
  Filled 2015-04-29: qty 200

## 2015-04-29 MED ORDER — DOXYCYCLINE HYCLATE 50 MG PO CAPS: 100.0000 mg | ORAL_CAPSULE | Freq: Two times a day (BID) | ORAL | Status: DC

## 2015-04-29 MED ORDER — INDOMETHACIN 25 MG PO CAPS
50.0000 mg | ORAL_CAPSULE | Freq: Once | ORAL | Status: AC
  Administered 2015-04-29: 50 mg via ORAL
  Filled 2015-04-29: qty 2

## 2015-04-29 NOTE — ED Notes (Signed)
Patient transported to X-ray 

## 2015-04-29 NOTE — ED Provider Notes (Signed)
CSN: [id number]     Arrival date & time 04/29/15  1243 History   First MD Initiated Contact with Patient 04/29/15 1253     Chief Complaint  Patient presents with  . Abscess     (Consider location/radiation/quality/duration/timing/severity/associated sxs/prior Treatment) Patient is a 54 y.o. female presenting with abscess. The history is provided by the patient and medical records. No language interpreter was used.  Abscess Associated symptoms: no headaches, no nausea and no vomiting    Deborah Benson is a 54 y.o. female  with a PMH of DM who presents to the Emergency Department complaining of acute, worsening left hand pain and swelling x 1 day. Pt. States she saw a spider when cleaning yesterday, but did not feel a bite, or see the spider on her hand. She says she used BC powder for pain, and local heat compresses at home last night with mild relief. Pain is described as throbbing 9/10. + erythema. Denies fever, chills, drainage.   Past Medical History  Diagnosis Date  . Diabetes mellitus without complication ([place])   . Obesity   . Neuropathy ([place])    History reviewed. No pertinent past surgical history. History reviewed. No pertinent family history. Social History  Substance Use Topics  . Smoking status: Current Every Day Smoker -- .4 years    Types: Cigarettes  . Smokeless tobacco: None  . Alcohol Use: No   OB History    No data available     Review of Systems  Constitutional: Negative.   HENT: Negative for congestion, rhinorrhea and sore throat.   Eyes: Negative for visual disturbance.  Respiratory: Negative for cough, shortness of breath and wheezing.   Cardiovascular: Negative.   Gastrointestinal: Negative for nausea, vomiting, abdominal pain, diarrhea and constipation.  Endocrine: Negative for polydipsia and polyuria.  Musculoskeletal: Positive for arthralgias. Negative for myalgias, back pain and neck pain.  Skin: Negative for rash.  Neurological: Negative for  dizziness, weakness and headaches.      Allergies  Penicillins  Home Medications   Prior to Admission medications   Medication Sig Start Date End Date Taking? Authorizing Provider  albuterol (VENTOLIN HFA) 108 (90 BASE) MCG/ACT inhaler inhale 2 puffs by mouth every 6 hours if needed for shortness of breath 08/10/14  Yes Lind Covert, MD  Aspirin-Salicylamide-Caffeine (BC HEADACHE POWDER PO) Take 1 packet by mouth daily as needed (pain).   Yes Historical Provider, MD  gabapentin (NEURONTIN) 300 MG capsule Take 2 capsules (600 mg total) by mouth at bedtime. 10/28/14  Yes Lind Covert, MD  metFORMIN (GLUCOPHAGE) 500 MG tablet Take 1 tablet (500 mg total) by mouth 2 (two) times daily. 10/28/14  Yes Lind Covert, MD  ACCU-CHEK AVIVA PLUS test strip TEST ONCE DAILY 09/12/12   Lind Covert, MD  Blood Glucose Monitoring Suppl (ACCU-CHEK ACTIVE CARE KIT) KIT 1 Device by Does not apply route daily. 11/27/11   Lind Covert, MD  doxycycline (VIBRAMYCIN) 50 MG capsule Take 2 capsules (100 mg total) by mouth 2 (two) times daily. 04/29/15   Ozella Almond Ward, PA-C  indomethacin (INDOCIN) 50 MG capsule Take 1 capsule (50 mg total) by mouth 3 (three) times daily with meals. When pain becomes tolerable, decrease to twice daily for one day, then once the next day. 04/29/15   Ozella Almond Ward, PA-C  lisinopril (PRINIVIL,ZESTRIL) 10 MG tablet take 1 tablet by mouth once daily Patient not taking: Reported on 04/29/2015 03/02/15   Lind Covert, MD  BP 111/97 mmHg  Pulse 68  Temp(Src) 98.1 F (36.7 C) (Oral)  Resp 18  Ht _0  (1.626 m)  Wt 423 lb 14.4 oz (192.28 kg)  BMI 72.73 kg/m2  SpO2 98% Physical Exam  Constitutional: She is oriented to person, place, and time. She appears well-developed and well-nourished.  Alert and in no acute distress  HENT:  Head: Normocephalic and atraumatic.  Cardiovascular: Normal rate, regular rhythm, normal heart sounds and  intact distal pulses.  Exam reveals no gallop and no friction rub.   No murmur heard. Pulmonary/Chest: Effort normal and breath sounds normal. No respiratory distress. She has no wheezes. She has no rales. She exhibits no tenderness.  Abdominal: She exhibits no mass. There is no rebound and no guarding.  Abdomen soft, non-tender, non-distended Bowel sounds positive in all four quadrants  Musculoskeletal:       Arms: Left hand: swelling and erythema; Decreased ROM 2/2 pain - pain with digits in full extension; tenderness to palpation - worst over index MCP joint  Neurological: She is alert and oriented to person, place, and time.  Skin: Skin is warm and dry. No rash noted.  Psychiatric: She has a normal mood and affect. Her behavior is normal. Judgment and thought content normal.  Nursing note and vitals reviewed.   ED Course  Procedures (including critical care time) Labs Review Labs Reviewed - No data to display  Imaging Review Dg Hand Complete Left  04/29/2015  CLINICAL DATA:  Pain and swelling in the left hand, especially in the palm of hand. Hx diabetes, possibly gout EXAM: LEFT HAND - COMPLETE 3+ VIEW FINDINGS: mild overlap of fingers on the lateral view. No acute fracture or dislocation. Mild osteoarthritis, including joint space and subchondral sclerosis involving the DIP joints of the second and third digits. Suspect heterotopic ossification about the proximal portion of the proximal phalanx of the second digit. IMPRESSION: No acute osseous abnormality. Electronically Signed   By: Abigail Miyamoto M.D.   On: 04/29/2015 14:29   I have personally reviewed and evaluated these images and lab results as part of my medical decision-making.   EKG Interpretation None      MDM   Final diagnoses:  Hand pain, left   Deborah Benson presents with acute onset of erythema, swelling, and pain over left MCP index. Infxn, arthritis, gout considered.   No fusiform swelling, cap refill < 3  seconds, tenderness localized to MCP joint  Hand xray shows no acute osseous abnlities; mild OA of the 2nd and 3rd   Will start one dose of IV vanc here, and discharge home with PO doxycycline to cover for infxn. Also will dc home with indomethacin to cover for gout. Steroids considered, but decided against due to DM. Will recommend PCP follow up.   Patient seen by and discussed with Dr. Rogene Houston who agrees with treatment plan.     Ozella Almond Ward, PA-C 04/29/15 [phone]

## 2015-04-29 NOTE — Discharge Instructions (Signed)
1. Medications: Take doxycycline (antibiotic) and indomethacin as described - Please take all of your antibiotics until finished!, continue usual home medications 2. Treatment: rest, drink plenty of fluids 3. Follow Up: Please follow up with your primary doctor in 3 days for discussion of your diagnoses and further evaluation after today's visit; Please return to the ER for any new or worsening symptoms, any additional concerns.

## 2015-04-29 NOTE — ED Notes (Signed)
Pt reports possible spider bite to left hand since yesterday. Woke up with increase in pain, swelling, redness to left hand. Denies fever.

## 2015-04-29 NOTE — ED Provider Notes (Addendum)
Medical screening examination/treatment/procedure(s) were conducted as a shared visit with non-physician practitioner(s) and myself.  I personally evaluated the patient during the encounter.   EKG Interpretation None       Patient seen by me. We will get an x-ray of her left hand. Patient with onset of swelling redness and tenderness to the base of her left index finger starting on Thursday has gotten worse. Patient was concerned that maybe she had had been bitten by a spider but there was no evidence of any bite marks there's been no skin break or discharge of pus.  Clinically it's possible it could be related to gout patients never had gout patient's mother is had gout. We will get x-ray of the hand if it is not revealing we will probably treat as if it could be a cellulitis and/or gout. No evidence of any deep space infection at this point in time. The distal part of the finger is not swollen. Refill is normal and sensation is intact. There is no tenderness to palpation on the dorsum of the hand just over the MP joint on the palmar side.  Fredia Sorrow, MD 04/29/15 1418  Patient's x-ray with some findings of arthritis but nothing distinct. Recommended to the mid-level to treat as cellulitis and/or gout. Recommended a dose of vancomycin IV here and then discharged on doxycycline and then now pain medication control for the gout. Patient due to her diabetes probably not optimal to treat with prednisone if gout so therefore recommended indomethacin.  Fredia Sorrow, MD 04/30/15 [phone]

## 2015-10-25 ENCOUNTER — Ambulatory Visit (INDEPENDENT_AMBULATORY_CARE_PROVIDER_SITE_OTHER): Payer: Medicaid Other | Admitting: Family Medicine

## 2015-10-25 ENCOUNTER — Encounter: Payer: Self-pay | Admitting: Family Medicine

## 2015-10-25 VITALS — BP 154/87 | HR 76 | Temp 98.7°F | Wt >= 6400 oz

## 2015-10-25 DIAGNOSIS — E119 Type 2 diabetes mellitus without complications: Secondary | ICD-10-CM

## 2015-10-25 DIAGNOSIS — I10 Essential (primary) hypertension: Secondary | ICD-10-CM

## 2015-10-25 DIAGNOSIS — Z1159 Encounter for screening for other viral diseases: Secondary | ICD-10-CM

## 2015-10-25 DIAGNOSIS — E669 Obesity, unspecified: Secondary | ICD-10-CM | POA: Diagnosis not present

## 2015-10-25 DIAGNOSIS — Z1211 Encounter for screening for malignant neoplasm of colon: Secondary | ICD-10-CM | POA: Diagnosis not present

## 2015-10-25 DIAGNOSIS — Z114 Encounter for screening for human immunodeficiency virus [HIV]: Secondary | ICD-10-CM

## 2015-10-25 LAB — COMPREHENSIVE METABOLIC PANEL
ALBUMIN: 3.7 g/dL (ref 3.6–5.1)
ALT: 13 U/L (ref 6–29)
AST: 16 U/L (ref 10–35)
Alkaline Phosphatase: 98 U/L (ref 33–130)
BUN: 7 mg/dL (ref 7–25)
CALCIUM: 9.1 mg/dL (ref 8.6–10.4)
CHLORIDE: 100 mmol/L (ref 98–110)
CO2: 28 mmol/L (ref 20–31)
Creat: 0.87 mg/dL (ref 0.50–1.05)
GLUCOSE: 141 mg/dL — AB (ref 65–99)
POTASSIUM: 4.6 mmol/L (ref 3.5–5.3)
Sodium: 136 mmol/L (ref 135–146)
Total Bilirubin: 0.3 mg/dL (ref 0.2–1.2)
Total Protein: 8 g/dL (ref 6.1–8.1)

## 2015-10-25 LAB — POCT GLYCOSYLATED HEMOGLOBIN (HGB A1C): HEMOGLOBIN A1C: 7.4

## 2015-10-25 MED ORDER — LISINOPRIL 10 MG PO TABS: 10.0000 mg | ORAL_TABLET | Freq: Every day | ORAL | Status: DC

## 2015-10-25 MED ORDER — METFORMIN HCL 500 MG PO TABS: 500.0000 mg | ORAL_TABLET | Freq: Two times a day (BID) | ORAL | Status: DC

## 2015-10-25 NOTE — Assessment & Plan Note (Signed)
Not at goal likely due to noncompliance.  See after visit summary

## 2015-10-25 NOTE — Assessment & Plan Note (Signed)
Stable.  Encourage twice a day metformin

## 2015-10-25 NOTE — Progress Notes (Signed)
   Subjective:    Patient ID: Deborah Benson, female    DOB: December 30, 1960, 55 y.o.   MRN: [id number]  HPI  Constipation - for about 5 days. Feels diffusely bloated with broad upper abdomen discomfort.  No nausea and vomiting or fever.  Mild anorexia.  No gi bleeding.  Took 1/2 bottle Mag Citrate that helped some.  HYPERTENSION Disease Monitoring: Blood pressure range-not checkng Chest pain, palpitations- no      Dyspnea- no Medications: Compliance- taking lisinopril rarely only when has headache  Lightheadedness,Syncope- no   Edema- no  DIABETES Disease Monitoring: Blood Sugar ranges-not checking Polyuria/phagia/dipsia- no      Visual problems- no Medications: Compliance- taking metformin only daily not bid Hypoglycemic symptoms- no  Monitoring Labs and Parameters Last A1C:  Lab Results  Component Value Date   HGBA1C 7.4 10/25/2015    Last Lipid:     Component Value Date/Time   CHOL 153 08/11/2013 1051   HDL 46 08/11/2013 1051    Last Bmet  POTASSIUM  Date Value Ref Range Status  08/10/2014 4.3 3.5 - 5.3 mEq/L Final   SODIUM  Date Value Ref Range Status  08/10/2014 138 135 - 145 mEq/L Final   CREAT  Date Value Ref Range Status  08/10/2014 0.64 0.50 - 1.10 mg/dL Final   CREATININE, SER  Date Value Ref Range Status  07/11/2008 0.69 0.40-1.20 mg/dL Final      Last BPs:  BP Readings from Last 3 Encounters:  10/25/15 154/87  04/29/15 125/71  08/24/14 155/88    Chief Complaint noted Review of Symptoms - see HPI PMH - Smoking status noted.   Vital Signs reviewed    Review of Systems     Objective:   Physical Exam  Alert nad Abdomen: soft and  Mild diffusely tender over R and L upper abdomen without masses, organomegaly or hernias noted.  No guarding or rebound.  Exam limited by body habitus Lungs:  Normal respiratory effort, chest expands symmetrically. Lungs are clear to auscultation, no crackles or wheezes. Extremities:  No cyanosis, edema, or  deformity noted with good range of motion of all major joints.   Diabetic Foot Check -  Appearance - no lesions, ulcers or calluses Skin - no unusual pallor or redness Monofilament testing -  Right - Great toe, medial, central, lateral ball and posterior foot intact Left - Great toe, medial, central, lateral ball and posterior foot intact        Assessment & Plan:    Constipation No red flags for obstruction or focal inflamation/infection.  See after visit summary.  Needs a colonoscopy

## 2015-10-25 NOTE — Patient Instructions (Signed)
Diabetes Your diabetes is fairly well controlled Your goal is to have an A1c < 7   High Blood Pressure Your BP is not  controlled Your goal is to have a BP average < 140/90  Medicine Changes: Take the lisinopril every day  Homework: weight loss   For the constipation abdomen pain - start the metformin twice a day every day.  If not better in 2-3 days then repeat the Mag citrate 1/2 bottle You need a colonoscopy - cntact [facility]  Foot pain - arch supports   Come back in 3 months

## 2015-10-25 NOTE — Assessment & Plan Note (Signed)
Come back to discuss weight loss surgery

## 2015-10-26 ENCOUNTER — Encounter: Payer: Self-pay | Admitting: Family Medicine

## 2015-10-26 LAB — HIV ANTIBODY (ROUTINE TESTING W REFLEX): HIV 1&2 Ab, 4th Generation: NONREACTIVE

## 2015-10-26 LAB — HEPATITIS C ANTIBODY: HCV AB: NEGATIVE

## 2015-11-27 ENCOUNTER — Encounter: Payer: Self-pay | Admitting: Internal Medicine

## 2016-01-15 ENCOUNTER — Telehealth: Payer: Self-pay

## 2016-01-15 NOTE — Telephone Encounter (Signed)
Agree with Nicoletta Ba, PA-C regarding office visit to discuss screening in patient with morbid obesity. If colonoscopy is pursued it would need to be at the hospital in the outpatient setting

## 2016-01-15 NOTE — Telephone Encounter (Signed)
Dr Hilarie Fredrickson and Vaughan Basta,      BMI >72.  MD would you like an OV first or can she be a direct hospital case?                                                                            Thank you,                                                                             Angela/PV

## 2016-01-15 NOTE — Telephone Encounter (Signed)
Not sure why this was routed to me but pt will need an office visit

## 2016-01-22 ENCOUNTER — Ambulatory Visit (INDEPENDENT_AMBULATORY_CARE_PROVIDER_SITE_OTHER): Payer: Medicaid Other | Admitting: Gastroenterology

## 2016-01-22 ENCOUNTER — Encounter: Payer: Self-pay | Admitting: Gastroenterology

## 2016-01-22 VITALS — BP 138/78 | HR 80 | Ht 62.75 in | Wt >= 6400 oz

## 2016-01-22 DIAGNOSIS — Z6841 Body Mass Index (BMI) 40.0 and over, adult: Secondary | ICD-10-CM

## 2016-01-22 DIAGNOSIS — Z1211 Encounter for screening for malignant neoplasm of colon: Secondary | ICD-10-CM | POA: Diagnosis not present

## 2016-01-22 MED ORDER — NA SULFATE-K SULFATE-MG SULF 17.5-3.13-1.6 GM/177ML PO SOLN: 1.0000 | Freq: Once | ORAL | 0 refills | Status: AC

## 2016-01-22 NOTE — Patient Instructions (Addendum)
You have been scheduled for a colonoscopy. Please follow written instructions given to you at your visit today. Please pick up your prep supplies at the pharmacy within the next 1-3 days.[facility] [address]   If you use inhalers (even only as needed), please bring them with you on the day of your procedure. Your physician has requested that you go to www.startemmi.com and enter the access code given to you at your visit today. This web site gives a general overview about your procedure. However, you should still follow specific instructions given to you by our office regarding your preparation for the procedure. 

## 2016-01-22 NOTE — Progress Notes (Addendum)
01/22/2016 Deborah Benson [id number] [birth year]   HISTORY OF PRESENT ILLNESS:  This is a 55 year old female who is new to our practice and is here today to discuss colonoscopy.  She has never had one in the past.  BMI is over 70 so office visit was necessary to schedule at [facility].  She denies any complaints.  Does not use CPAP or O2 and is not on any anticoagulation.   Past Medical History:  Diagnosis Date  . Asthma   . Depression   . Diabetes mellitus without complication ([place])   . Gout   . HTN (hypertension)   . Neuropathy ([place])   . Obesity    Past Surgical History:  Procedure Laterality Date  . FOOT SURGERY     due to needle break in foot  . TONSILLECTOMY      reports that she has been smoking Cigarettes.  She has smoked for the past 0.40 years. She has never used smokeless tobacco. She reports that she does not drink alcohol or use drugs. family history includes Breast cancer in her sister; Dementia in her maternal grandfather; Diabetes in her brother; Glaucoma in her father and other; Gout in her mother; Heart attack in her mother; Heart disease in her sister; Hypertension in her mother; Lung cancer in her father; Other in her sister; Seizures in her other; Stroke in her mother. Allergies  Allergen Reactions  . Penicillins Hives      Outpatient Encounter Prescriptions as of 01/22/2016  Medication Sig  . ACCU-CHEK AVIVA PLUS test strip TEST ONCE DAILY  . albuterol (VENTOLIN HFA) 108 (90 BASE) MCG/ACT inhaler inhale 2 puffs by mouth every 6 hours if needed for shortness of breath  . Aspirin-Salicylamide-Caffeine (BC HEADACHE POWDER PO) Take 1 packet by mouth daily as needed (pain). Reported on 10/25/2015  . Blood Glucose Monitoring Suppl (ACCU-CHEK ACTIVE CARE KIT) KIT 1 Device by Does not apply route daily.  Marland Kitchen gabapentin (NEURONTIN) 300 MG capsule Take 2 capsules (600 mg total) by mouth at bedtime.  Marland Kitchen lisinopril (PRINIVIL,ZESTRIL) 10 MG tablet Take 1 tablet (10 mg  total) by mouth daily.  . metFORMIN (GLUCOPHAGE) 500 MG tablet Take 1 tablet (500 mg total) by mouth 2 (two) times daily.   No facility-administered encounter medications on file as of 01/22/2016.      REVIEW OF SYSTEMS  : All other systems reviewed and negative except where noted in the History of Present Illness.   PHYSICAL EXAM: BP 138/78 (BP Location: Left Wrist, Patient Position: Sitting, Cuff Size: Normal)   Pulse 80   Ht 5' 2.75" (1.594 m) Comment: height measured without shoes  Wt (!) 429 lb 2 oz (194.6 kg)   BMI 76.62 kg/m  General: Well developed black female in no acute distress Head: Normocephalic and atraumatic Eyes:  Sclerae anicteric, conjunctiva pink. Ears: Normal auditory acuity Lungs: Clear throughout to auscultation Heart: Regular rate and rhythm Abdomen: Soft, non-distended.  Normal bowel sounds.  Non-tender. Rectal:  Will be done at the time of colonoscopy. Musculoskeletal: Symmetrical with no gross deformities  Skin: No lesions on visible extremities Extremities: No edema  Neurological: Alert oriented x 4, grossly non-focal Psychological:  Alert and cooperative. Normal mood and affect  ASSESSMENT AND PLAN: -Screening colonoscopy:  Will schedule with Dr. Hilarie Fredrickson at [facility] hospital due to elevated BMI. -BMI over 70  *The risks, benefits, and alternatives to colonoscopy were discussed with the patient and she consents to proceed.   CC:  Chambliss, Jeb Levering, *  Addendum: Reviewed and agree with initial management. Jerene Bears, MD

## 2016-01-31 ENCOUNTER — Encounter: Payer: Medicaid Other | Admitting: Internal Medicine

## 2016-03-22 ENCOUNTER — Encounter (HOSPITAL_COMMUNITY): Payer: Self-pay | Admitting: *Deleted

## 2016-03-26 ENCOUNTER — Ambulatory Visit (HOSPITAL_COMMUNITY): Admission: RE | Admit: 2016-03-26 | Payer: Medicaid Other | Source: Ambulatory Visit | Admitting: Internal Medicine

## 2016-03-26 HISTORY — DX: Myoneural disorder, unspecified: G70.9

## 2016-03-26 SURGERY — COLONOSCOPY WITH PROPOFOL
Anesthesia: Monitor Anesthesia Care

## 2016-09-11 ENCOUNTER — Ambulatory Visit (INDEPENDENT_AMBULATORY_CARE_PROVIDER_SITE_OTHER): Payer: Medicaid Other | Admitting: Family Medicine

## 2016-09-11 ENCOUNTER — Encounter: Payer: Self-pay | Admitting: Family Medicine

## 2016-09-11 VITALS — BP 140/86 | HR 74 | Temp 98.3°F | Wt >= 6400 oz

## 2016-09-11 DIAGNOSIS — IMO0001 Reserved for inherently not codable concepts without codable children: Secondary | ICD-10-CM

## 2016-09-11 DIAGNOSIS — I10 Essential (primary) hypertension: Secondary | ICD-10-CM

## 2016-09-11 DIAGNOSIS — E119 Type 2 diabetes mellitus without complications: Secondary | ICD-10-CM

## 2016-09-11 DIAGNOSIS — Z6841 Body Mass Index (BMI) 40.0 and over, adult: Secondary | ICD-10-CM | POA: Diagnosis not present

## 2016-09-11 DIAGNOSIS — B070 Plantar wart: Secondary | ICD-10-CM | POA: Insufficient documentation

## 2016-09-11 DIAGNOSIS — E669 Obesity, unspecified: Secondary | ICD-10-CM | POA: Diagnosis not present

## 2016-09-11 DIAGNOSIS — F17200 Nicotine dependence, unspecified, uncomplicated: Secondary | ICD-10-CM

## 2016-09-11 LAB — POCT GLYCOSYLATED HEMOGLOBIN (HGB A1C): Hemoglobin A1C: 7.4

## 2016-09-11 MED ORDER — METFORMIN HCL 1000 MG PO TABS: 1000.0000 mg | ORAL_TABLET | Freq: Two times a day (BID) | ORAL | 2 refills | Status: DC

## 2016-09-11 MED ORDER — ALBUTEROL SULFATE HFA 108 (90 BASE) MCG/ACT IN AERS: INHALATION_SPRAY | RESPIRATORY_TRACT | 6 refills | Status: DC

## 2016-09-11 MED ORDER — LISINOPRIL 10 MG PO TABS: 10.0000 mg | ORAL_TABLET | Freq: Every day | ORAL | 0 refills | Status: DC

## 2016-09-11 MED ORDER — GABAPENTIN 300 MG PO CAPS: 600.0000 mg | ORAL_CAPSULE | Freq: Every day | ORAL | 2 refills | Status: DC

## 2016-09-11 NOTE — Assessment & Plan Note (Signed)
Will investigate treatment options

## 2016-09-11 NOTE — Patient Instructions (Addendum)
Good to see you today!  Thanks for coming in.  Homework  Mammogram  Schedule colonoscopy  For diabetes  Metformin 1000 mg twice a day   For blood pressure  Take lisinopril every day once a day  Call if you have questions  Check your blood pressure write down reading and bring in  See your eye doctor for a diabetes eye check  For your plantar wart  I will look into a referral to have this treated.  If you don' hear from me in 2 weeks then call   The next step for smoking  To Stop by July   Come back in June for a blood pressure check - bring in your reading

## 2016-09-11 NOTE — Assessment & Plan Note (Signed)
Unchanged See after visit summary

## 2016-09-11 NOTE — Assessment & Plan Note (Signed)
Not at goal.  Discussed reason to take her medications

## 2016-09-11 NOTE — Progress Notes (Signed)
Subjective  Patient is presenting with the following illnesses  HYPERTENSION Disease Monitoring: Blood pressure range-not checking Chest pain, palpitations- no      Dyspnea- none new Medications: Compliance- has not taken her ARB for months Lightheadedness,Syncope- no   Edema- occsl  DIABETES Disease Monitoring: Blood Sugar ranges-not checking Polyuria/phagia/dipsia- no      Visual problems- no Medications: Compliance- takes metformin daily Hypoglycemic symptoms- no  OBESITY Current weight/BMI : Body mass index is 76.53 kg/m.    How long have been obese:  years Course:  Stable over the last few years Problems or symptoms it causes:  Pain in her legs when walks   Things have tried to improve:  Exercise but her legs and feet hurt  R foot pain Focal area of pain in mid right foot No trauma or fb   Monitoring Labs and Parameters Last A1C:  Lab Results  Component Value Date   HGBA1C 7.4 09/11/2016    Last Lipid:     Component Value Date/Time   CHOL 153 08/11/2013 1051   HDL 46 08/11/2013 1051    Last Bmet  Potassium  Date Value Ref Range Status  10/25/2015 4.6 3.5 - 5.3 mmol/L Final   Sodium  Date Value Ref Range Status  10/25/2015 136 135 - 146 mmol/L Final   Creat  Date Value Ref Range Status  10/25/2015 0.87 0.50 - 1.05 mg/dL Final      Last BPs:  BP Readings from Last 3 Encounters:  09/11/16 140/86  01/22/16 138/78  10/25/15 (!) 154/87         Chief Complaint noted Review of Symptoms - see HPI PMH - Smoking status noted.     Objective Vital Signs reviewed Diabetic Foot Check -  Appearance - no lesions, ulcers or calluses Skin - no unusual pallor or redness Monofilament testing -  Right - Great toe, medial, central, lateral ball and posterior foot intact Left - Great toe, medial, central, lateral ball and posterior foot intact Sole of R foot has what appears to be a plantar wart Heart - Regular rate and rhythm.  No murmurs, gallops or rubs.     Lungs:  Normal respiratory effort, chest expands symmetrically. Lungs are clear to auscultation, no crackles or wheezes.     Assessments/Plans  No problem-specific Assessment & Plan notes found for this encounter.   See Encounter view if individual problem A/Ps not visible See after visit summary for details of patient instuctions

## 2016-09-11 NOTE — Assessment & Plan Note (Signed)
Stable. Will increase metformin to see if can get less than 7 and if will help with weight loss

## 2016-09-11 NOTE — Assessment & Plan Note (Signed)
Down to 5 cigs.  Plans to be smoke free in [birth year] her [birth year]

## 2016-09-12 ENCOUNTER — Telehealth: Payer: Self-pay | Admitting: *Deleted

## 2016-09-12 ENCOUNTER — Telehealth: Payer: Self-pay | Admitting: Family Medicine

## 2016-09-12 MED ORDER — METFORMIN HCL 500 MG PO TABS: 1000.0000 mg | ORAL_TABLET | Freq: Two times a day (BID) | ORAL | 1 refills | Status: DC

## 2016-09-12 NOTE — Telephone Encounter (Signed)
Please call her and schedule her for an upcoming derm clnic for treatment of her plantar wart  Thanks  LC

## 2016-09-12 NOTE — Telephone Encounter (Signed)
Patient states that she is unable to swallow the metformin 1000mg  (had before and too big) and would like to have her old dosing back and just increase how many tablets she is taking at 1 time.  Will forward to MD. Johnney Ou

## 2016-09-12 NOTE — Telephone Encounter (Signed)
Done

## 2016-09-13 NOTE — Telephone Encounter (Signed)
Spoke with patient, appointment scheduled in next available slot for derm clinic 5/9 at 2:15.

## 2016-10-16 ENCOUNTER — Ambulatory Visit (INDEPENDENT_AMBULATORY_CARE_PROVIDER_SITE_OTHER): Payer: Medicaid Other | Admitting: Family Medicine

## 2016-10-16 ENCOUNTER — Encounter: Payer: Self-pay | Admitting: Family Medicine

## 2016-10-16 VITALS — BP 90/56 | Temp 98.2°F | Ht 63.0 in | Wt >= 6400 oz

## 2016-10-16 DIAGNOSIS — I10 Essential (primary) hypertension: Secondary | ICD-10-CM | POA: Diagnosis not present

## 2016-10-16 DIAGNOSIS — L84 Corns and callosities: Secondary | ICD-10-CM | POA: Diagnosis present

## 2016-10-16 DIAGNOSIS — B070 Plantar wart: Secondary | ICD-10-CM | POA: Diagnosis not present

## 2016-10-16 DIAGNOSIS — I952 Hypotension due to drugs: Secondary | ICD-10-CM

## 2016-10-16 MED ORDER — LISINOPRIL 5 MG PO TABS: 5.0000 mg | ORAL_TABLET | Freq: Every day | ORAL | 0 refills | Status: DC

## 2016-10-16 NOTE — Progress Notes (Signed)
    Subjective:    Patient ID: Deborah Benson, female    DOB: 1960-08-07, 56 y.o.   MRN: [id number]   CC: here for plantar wart removal  Was in shower a couple days ago and while drying her feet noticed "something sticking out of the wart" so she took tweezers and removed the center. Still has pain when ambulating on it. She is worried about it healing because of her diabetes.  Also reports feeling very lightheaded and has almost fallen twice. Has recently changed BP medications and unsure if it is related. Denies chest pain, SOB, changes in vision. Eating and drinking normally.   Objective:  BP (!) 90/56   Temp 98.2 F (36.8 C) (Oral)   Ht 5\' 3"  (1.6 m)   Wt (!) 430 lb 9.6 oz (195.3 kg)   SpO2 96%   BMI 76.28 kg/m  Vitals and nursing note reviewed  General: well nourished, in no acute distress Skin: warm and dry, no rashes noted. See attached picture Neuro: alert and oriented, no focal deficits     Assessment & Plan:    Hypertension  BP low today 90/56, patient has been feeling dizzy and like she may faint  -stop taking 10 mg lisinopril -new rx for 5 mg lisinopril sent to pharmacy -patient advised to call if symptomatic and keep track of blood pressure at home. If lower than 100/60 advised to call. -follow up with PCP in 1-2 weeks  Plantar wart  Appearance today more consistent with callous  -referral made to podiatry, patient would benefit from this consultation to prevent diabetic foot complications, may need special shoes to offload pressure from this area of her foot and prevent future ulcers -return if area worsens     Return in about 1 week (around 10/23/2016).   Lucila Maine, DO Family Medicine Resident PGY-1

## 2016-10-16 NOTE — Assessment & Plan Note (Addendum)
  BP low today 90/56, patient has been feeling dizzy and like she may faint  -stop taking 10 mg lisinopril -new rx for 5 mg lisinopril sent to pharmacy -patient advised to call if symptomatic and keep track of blood pressure at home. If lower than 100/60 advised to call. -follow up with PCP in 1-2 weeks

## 2016-10-16 NOTE — Addendum Note (Signed)
Addended by: Andrena Mews T on: 10/16/2016 03:37 PM   Modules accepted: Level of Service

## 2016-10-16 NOTE — Patient Instructions (Signed)
  Please check your blood pressure and make sure it is not lower than 100/60.  If you feel lightheaded or like you are going to fall please call Dr. Erin Hearing at [phone]  Please see Dr. Erin Hearing in 1-2 weeks.  Take care!

## 2016-10-16 NOTE — Assessment & Plan Note (Signed)
  Appearance today more consistent with callous  -referral made to podiatry, patient would benefit from this consultation to prevent diabetic foot complications, may need special shoes to offload pressure from this area of her foot and prevent future ulcers -return if area worsens

## 2016-10-23 ENCOUNTER — Telehealth: Payer: Self-pay | Admitting: Family Medicine

## 2016-10-23 NOTE — Telephone Encounter (Signed)
Pt has not scheduled her mammogram or colonoscopy yet. She plans on scheduling her mammogram this year and she's hesitant about getting her colonoscopy. She understands the importance of receiving one and states she didn't have a good experience with her last colonoscopy.  Pt is no longer being followed by an ophthalmologist but is looking for one who will take her insurance.   Pt is compliant with her medications. She's down to 5-6 cigarettes and she's trying to loose weight.  I reminded her to come in 15 min early for her appt 10/30/16 and to bring in all of her medications. - Mesha Guinyard

## 2016-10-30 ENCOUNTER — Ambulatory Visit (INDEPENDENT_AMBULATORY_CARE_PROVIDER_SITE_OTHER): Payer: Medicaid Other | Admitting: Family Medicine

## 2016-10-30 ENCOUNTER — Encounter: Payer: Self-pay | Admitting: Family Medicine

## 2016-10-30 DIAGNOSIS — I10 Essential (primary) hypertension: Secondary | ICD-10-CM

## 2016-10-30 DIAGNOSIS — Z6841 Body Mass Index (BMI) 40.0 and over, adult: Secondary | ICD-10-CM | POA: Diagnosis not present

## 2016-10-30 DIAGNOSIS — Z135 Encounter for screening for eye and ear disorders: Secondary | ICD-10-CM | POA: Diagnosis not present

## 2016-10-30 DIAGNOSIS — B070 Plantar wart: Secondary | ICD-10-CM

## 2016-10-30 DIAGNOSIS — IMO0001 Reserved for inherently not codable concepts without codable children: Secondary | ICD-10-CM

## 2016-10-30 DIAGNOSIS — E669 Obesity, unspecified: Secondary | ICD-10-CM

## 2016-10-30 NOTE — Assessment & Plan Note (Signed)
Unchanged.  Did not shave as much as usual given her brother just died and she will need to do a lot of walking in [place] for the funeral

## 2016-10-30 NOTE — Patient Instructions (Addendum)
Good to see you today!  Thanks for coming in.  Keep doing what you are doing  Make an appointment for a mamogram  Come back in 3-4 weeks for a pap smear and check on   I put in a referral for Medicaid eye doctor - if you do not hear from [place] in 2 weeks call [place]  Put compound W wart remover on the wart every day til it gets irrirtated and a small blister and use a pad with a hole in it

## 2016-10-30 NOTE — Progress Notes (Signed)
Subjective  Patient is presenting with the following illnesses  FOOT Lesion Still causing her pain when walking.  No skin breakdown or discharge   OBESITY Lost weight.  Working on diet - Trying a no white food diet next month.  Still with pains in her legs  HYPERTENSION Disease Monitoring  Home BP Monitoring (Severity) not checking Symptoms - Chest pain- no    Dyspnea- no Medications (Modifying factors) Compliance-  Off all medications now. Lightheadedness-  no  Edema- no Timing - continuous  Duration - years ROS - See HPI  PMH Lab Review   Potassium  Date Value Ref Range Status  10/25/2015 4.6 3.5 - 5.3 mmol/L Final   Sodium  Date Value Ref Range Status  10/25/2015 136 135 - 146 mmol/L Final   Creat  Date Value Ref Range Status  10/25/2015 0.87 0.50 - 1.05 mg/dL Final           Chief Complaint noted Review of Symptoms - see HPI PMH - Smoking status noted.     Objective Vital Signs reviewed Left Foot - callus vs plantar wart.  Shaved down approx 1/4 in.  No bleeding    Assessments/Plans  No problem-specific Assessment & Plan notes found for this encounter.   See Encounter view if individual problem A/Ps not visible See after visit summary for details of patient instuctions

## 2016-10-30 NOTE — Assessment & Plan Note (Signed)
Improved - encouraged diet

## 2016-10-30 NOTE — Assessment & Plan Note (Signed)
BP Readings from Last 3 Encounters:  10/30/16 122/80  10/16/16 (!) 90/56  09/11/16 140/86   Controlled with no medications.  Will follow

## 2016-11-26 ENCOUNTER — Telehealth: Payer: Self-pay | Admitting: Family Medicine

## 2016-11-26 NOTE — Telephone Encounter (Signed)
Unsuccessful contact with provided numbers.  CA screening: Have you had your mammogram? Have you had you pap smear done? Would you like me to schedule a pap smear here?  Vision: Have you heard from [facility] eye doctor? Made eye doctor appt?  Wart: Have you put compound W wart remover on the wart every day? - Deborah Benson

## 2016-12-18 ENCOUNTER — Ambulatory Visit (INDEPENDENT_AMBULATORY_CARE_PROVIDER_SITE_OTHER): Payer: Medicaid Other | Admitting: Internal Medicine

## 2016-12-18 ENCOUNTER — Encounter: Payer: Self-pay | Admitting: Internal Medicine

## 2016-12-18 DIAGNOSIS — G8929 Other chronic pain: Secondary | ICD-10-CM

## 2016-12-18 DIAGNOSIS — M25562 Pain in left knee: Secondary | ICD-10-CM

## 2016-12-18 MED ORDER — MELOXICAM 15 MG PO TABS: 15.0000 mg | ORAL_TABLET | Freq: Every day | ORAL | 3 refills | Status: DC

## 2016-12-18 NOTE — Progress Notes (Signed)
   Subjective:   Patient: Deborah Benson       Birthdate: 07/26/1960       MRN: [id number]      HPI  Deborah Benson is a 56 y.o. female presenting for same day appointment for knee pain.   Knee pain Patient has chronic history of knee pain bilaterally, primarily a popping sensation, but says recently she has been having worsening pain in her L knee only. Says the pain is so severe that it brings tears to her eyes. Reports almost falling over due to pain, and says she has to grab onto something so she does not fall. Occurs only with walking. Sudden onset but then resolves suddenly for 86min to 1hr, or whenever she starts walking again. Takes gabapentin at night, but has not tried anything else. Was previously taking BC Powder but hasn't taken this recently because she heard it was bad for her. Has not taken any NSAIDs. Denies redness, tenderness, swelling. Pain mostly on sides of her knee. Thinks a cane would be helpful.   Smoking status reviewed. Patient is current every day smoker.   Review of Systems See HPI.     Objective:  Physical Exam  Constitutional:  Obese female in NAD  HENT:  Head: Normocephalic and atraumatic.  Pulmonary/Chest: Effort normal. No respiratory distress.  Musculoskeletal:  Exam difficult due to patient's body habitus, however no TTP of either knee. No erythema or edema noted. No popping or click with movement. Able to walk, sit, and stand without assistance.   Skin: Skin is warm and dry.  Psychiatric: Affect and judgment normal.      Assessment & Plan:  Left knee pain Acute on chronic. No abnormalities noted on physical exam. Discussed with patient that likely worsening of her already known arthritis, and that her weight is a major contributor. Discussed that symptoms likely will not resolve until she has lost a significant amount of weight, though we can try to improve her symptoms so she will be able to exercise. Patient amenable to trying NSAID.  - Mobic 15mg   qd - Provided DME prescription for cane, however informed patient that cane may not be covered by insurance.  - Patient requesting handicapped placard. Filled out form and returned to patient.   Adin Hector, MD, MPH PGY-3 [place] [facility] Pager [phone]

## 2016-12-19 ENCOUNTER — Encounter: Payer: Self-pay | Admitting: Internal Medicine

## 2016-12-19 DIAGNOSIS — M25562 Pain in left knee: Secondary | ICD-10-CM | POA: Insufficient documentation

## 2016-12-19 NOTE — Assessment & Plan Note (Signed)
Acute on chronic. No abnormalities noted on physical exam. Discussed with patient that likely worsening of her already known arthritis, and that her weight is a major contributor. Discussed that symptoms likely will not resolve until she has lost a significant amount of weight, though we can try to improve her symptoms so she will be able to exercise. Patient amenable to trying NSAID.  - Mobic 15mg  qd - Provided DME prescription for cane, however informed patient that cane may not be covered by insurance.  - Patient requesting handicapped placard. Filled out form and returned to patient.

## 2016-12-20 ENCOUNTER — Ambulatory Visit (INDEPENDENT_AMBULATORY_CARE_PROVIDER_SITE_OTHER): Payer: Medicaid Other | Admitting: Podiatry

## 2016-12-20 ENCOUNTER — Encounter: Payer: Self-pay | Admitting: Podiatry

## 2016-12-20 VITALS — BP 131/79 | HR 74

## 2016-12-20 DIAGNOSIS — M722 Plantar fascial fibromatosis: Secondary | ICD-10-CM

## 2016-12-20 MED ORDER — TRIAMCINOLONE ACETONIDE 10 MG/ML IJ SUSP
10.0000 mg | Freq: Once | INTRAMUSCULAR | Status: AC
  Administered 2016-12-20: 10 mg

## 2016-12-20 NOTE — Progress Notes (Signed)
Subjective:    Patient ID: Deborah Benson, female   DOB: 56 y.o.   MRN: [id number]   HPI patient presents with significant discomfort underneath the right foot lateral side with inflammation noted and states that it's been going on for several months    Review of Systems  All other systems reviewed and are negative.       Objective:  Physical Exam  Cardiovascular: Intact distal pulses.   Pulmonary/Chest: Rales: neurovascular status found to be intact with obese female with significant discomfort and lesion formation plantar lateral aspect mid arch area right with fluid buildup noted and pain when palpated.  Musculoskeletal: Normal range of motion.  Neurological: She is alert.  Skin: Skin is warm and dry.  Nursing note and vitals reviewed.      Assessment:  Inflammatory fasciitis with lesion formation right       Plan:  H&P condition reviewed and injected the right plantar fascia mid arch 3 mg Kenalog 5 g Xylocaine and discussed the obesity and it's relationship to her problems she is experiencing

## 2016-12-20 NOTE — Progress Notes (Signed)
   Subjective:    Patient ID: Deborah Benson, female    DOB: 1961-01-25, 56 y.o.   MRN: [id number]  HPI  Chief Complaint  Patient presents with  . Plantar Warts    Rt foot bottom middle onset 3 months       Review of Systems  Eyes: Positive for redness.  Musculoskeletal: Positive for gait problem.  Skin: Positive for color change.  All other systems reviewed and are negative.      Objective:   Physical Exam        Assessment & Plan:

## 2017-01-23 ENCOUNTER — Telehealth: Payer: Self-pay | Admitting: *Deleted

## 2017-01-23 NOTE — Telephone Encounter (Signed)
Patient called requesting an order or a prescription for a shower chair. Please give her a call. Number listed in current.  Derl Barrow, RN

## 2017-01-23 NOTE — Telephone Encounter (Signed)
Please let her know she needs to come in for an office visit and Pap smear  Will address this then  Thanks

## 2017-08-27 ENCOUNTER — Telehealth: Payer: Self-pay | Admitting: Family Medicine

## 2017-08-27 NOTE — Telephone Encounter (Signed)
Pt went to [facility] about 6 months ago and they told her she had dry eyes.  She has been using over the counter eye drops.  Patient said that she will call today to make an appointment for her mammogram and will schedule her appt here at the office after the mammogram so she can get her results and see you at the same time.  If the Mammogram appt is over a month from now, she will make her appt for the family medicine clinic priorso she can get her pap smear, A1C, and discuss her colonoscopy options in the interim.  Pt noted that she was scheduled for a colonoscopy before but was discouraged when the particular practice told her she had to reschedule her appt for [facility] due to her weight.  I was able to reassure her that the practice was concerned about her safety during the procedure.

## 2017-11-07 ENCOUNTER — Encounter: Payer: Self-pay | Admitting: Family Medicine

## 2017-11-24 ENCOUNTER — Encounter: Payer: Self-pay | Admitting: Family Medicine

## 2018-02-17 ENCOUNTER — Other Ambulatory Visit: Payer: Self-pay

## 2018-02-17 ENCOUNTER — Encounter: Payer: Self-pay | Admitting: Family Medicine

## 2018-02-17 ENCOUNTER — Ambulatory Visit (INDEPENDENT_AMBULATORY_CARE_PROVIDER_SITE_OTHER): Payer: Medicaid Other | Admitting: Family Medicine

## 2018-02-17 VITALS — BP 132/75 | HR 87 | Temp 98.5°F | Wt >= 6400 oz

## 2018-02-17 DIAGNOSIS — E119 Type 2 diabetes mellitus without complications: Secondary | ICD-10-CM

## 2018-02-17 DIAGNOSIS — L304 Erythema intertrigo: Secondary | ICD-10-CM | POA: Diagnosis not present

## 2018-02-17 DIAGNOSIS — Z23 Encounter for immunization: Secondary | ICD-10-CM | POA: Diagnosis not present

## 2018-02-17 DIAGNOSIS — L301 Dyshidrosis [pompholyx]: Secondary | ICD-10-CM | POA: Diagnosis not present

## 2018-02-17 LAB — POCT GLYCOSYLATED HEMOGLOBIN (HGB A1C): HbA1c, POC (controlled diabetic range): 7.2 % — AB (ref 0.0–7.0)

## 2018-02-17 MED ORDER — ALBUTEROL SULFATE HFA 108 (90 BASE) MCG/ACT IN AERS: INHALATION_SPRAY | RESPIRATORY_TRACT | 6 refills | Status: DC

## 2018-02-17 MED ORDER — NYSTATIN 100000 UNIT/GM EX POWD: Freq: Four times a day (QID) | CUTANEOUS | 1 refills | Status: DC

## 2018-02-17 MED ORDER — CLOBETASOL PROPIONATE 0.05 % EX CREA: 1.0000 "application " | TOPICAL_CREAM | Freq: Every day | CUTANEOUS | 0 refills | Status: AC

## 2018-02-17 NOTE — Assessment & Plan Note (Signed)
A1c today is 7.2.  She is not taking any medications at this time.  This is currently diet controlled.  She had side effects to metformin.  We discussed the long-term benefits of metformin therapy for diabetes.  She will return to continue discussion with Dr. Erin Hearing.

## 2018-02-17 NOTE — Patient Instructions (Signed)
It was wonderful to see you today.  Thank you for choosing [facility].   Please call [phone] with any questions about today's appointment.  Please be sure to schedule follow up at the front  desk before you leave today.   Owain Eckerman, MD  Family Medicine    

## 2018-02-17 NOTE — Progress Notes (Signed)
Subjective:  Chosen Garron is a 57 y.o. female who presents to the [facility] today with a chief complaint of rash under her breasts.  The patient has a medical history significant for type 2 diabetes, morbid obesity, HPI:  Skin Concerns  Patient reports that she first noticed the rash under her breasts 2-3 weeks ago. The rash was pruritic.  She then began using a feminine powder which helped somewhat as well as an over-the-counter antifungal cream.  The cream markedly improved the pruritus.  In addition to the above rash she describes a many month history of a dry scaling rash on her hands.  She reports she has her hands in water most of the day.  She frequently washes dishes as well as a number of other items.  She noticed this this makes it worse she has tried nothing for this the rash is nonpruritic in nature.    Diabetes  The patient reports she has not been taking her metformin due to side effects.  She reports blurry vision as a side effect.  She is not interested in a medication for her high cholesterol.  She is pre-contemplative in regards to smoking cessation.  She denies polyuria or polydipsia.  She denies numbness or tingling in her lower extremities.  Pap Smear Patient reports a prior traumatic experience with a Pap smear.  She is not interested in getting this today.  She will consider discussing again with Dr. Erin Hearing her regular doctor.  ROS  Denies CP, HA, blurry vision, NVD, changes in urination, numbness in fingers or toes.  Objective:  Physical Exam: BP 132/75   Pulse 87   Temp 98.5 F (36.9 C) (Oral)   Wt (!) 415 lb (188.2 kg)   SpO2 97%   BMI 73.51 kg/m   Gen: NAD, resting comfortably CV: RRR with no murmurs appreciated distant heart sounds Pulm: NWOB, Expiratory wheeze present in left lung  GI: Normal bowel sounds present. Soft, Nontender, Nondistended. MSK: no edema, cyanosis, or clubbing noted Skin: warm, dry, hyperpigmented macular patchy scaly rash on  palms of both hands, Hyperpigmented patchy rash under breasts and in inguinal creases with surrounding satellite lesions.  No scaling Neuro: grossly normal, moves all extremities Psych: Normal affect and thought content  Results for orders placed or performed in visit on 02/17/18 (from the past 72 hour(s))  HgB A1c     Status: Abnormal   Collection Time: 02/17/18  9:16 AM  Result Value Ref Range   Hemoglobin A1C     HbA1c POC (<> result, manual entry)     HbA1c, POC (prediabetic range)     HbA1c, POC (controlled diabetic range) 7.2 (A) 0.0 - 7.0 %     Assessment/Plan:  Ryane Konieczny is a 56 y.o. female who presents to the [facility] today with a chief complaint of rash under her breasts and creases. Differential for the breast rash includes candida (most likely), eczema, some other form of contact dermatitis. Differential for rash on palms of hand includes eczema, rickettsia (no tick exposure), syphilis (not sexually active).   Candidal infection notable for the fact that residual hyperpigmentation remains.  We discussed that keeping the area clean and dry was most important.  Limiting feminine powders and scented soaps.  She will use an unscented soap pat the area dry after bathing and then applying nystatin.  This is only to be used for 1 week I do anticipate that rash could take more than a week to return to its  normal color given what looks like it was a rather severe irritation to the area.  It appears to be healing at this time -- Partially resolved  -- Nystatin powder prescribed  -- return if worsens   Dyshidrotic eczema, new condition, uncontrolled.  Could consider testing for syphilis in the future we will trial a steroid cream.  Discussed reducing contact with water at home -- encouraged decreasing time spent washing dishes with hands wet  -- Clobetasol cream- apply once daily for 7 days   Type 2 diabetes, controlled.  A1c today is 7.2.  We discussed the role of metformin and helping  her possibly live longer.  She declined we also discussed the role of statin therapy consideration of an ACE inhibitor or in our smoking cessation and aspirin therapy.  Health maintenance  -- Smoking cessation counseling  -- Tetanus booster given today --Return to discuss colonoscopy and mammogram with Dr. Erin Hearing which the patient is amenable to discussing.    John Gifford Shave, [employer]  02/17/2018 9:35 AM   Patient seen along with MS4 student Gifford Shave. I personally evaluated this patient along with the student, and verified all aspects of the history, physical exam, and medical decision making as documented by the student. I agree with the student's documentation and have made all necessary edits.  I have edited the above note. Personally examined and interviewed Mrs. Aline Brochure.  Dorris Singh, MD  Family Medicine Teaching Service

## 2018-02-23 ENCOUNTER — Telehealth: Payer: Self-pay

## 2018-02-23 DIAGNOSIS — L304 Erythema intertrigo: Secondary | ICD-10-CM

## 2018-02-23 MED ORDER — NYSTATIN 100000 UNIT/GM EX POWD: Freq: Four times a day (QID) | CUTANEOUS | 2 refills | Status: DC

## 2018-02-23 NOTE — Telephone Encounter (Signed)
New prescription at pharmacy.

## 2018-02-23 NOTE — Telephone Encounter (Signed)
Patient requests larger size. States 15 gram only lasts two days. Also available in 30 and 60 gram size.  Danley Danker, RN [facility] RN)

## 2018-05-05 ENCOUNTER — Telehealth: Payer: Self-pay | Admitting: Family Medicine

## 2018-05-05 NOTE — Telephone Encounter (Signed)
Attempted to contact pt to discuss overdue COL. Sent directly to voicemail. -[place]

## 2019-12-07 ENCOUNTER — Ambulatory Visit (INDEPENDENT_AMBULATORY_CARE_PROVIDER_SITE_OTHER): Payer: Medicaid Other | Admitting: Family Medicine

## 2019-12-07 ENCOUNTER — Other Ambulatory Visit: Payer: Self-pay

## 2019-12-07 VITALS — BP 132/80 | HR 82 | Ht 63.0 in | Wt >= 6400 oz

## 2019-12-07 DIAGNOSIS — N939 Abnormal uterine and vaginal bleeding, unspecified: Secondary | ICD-10-CM | POA: Diagnosis not present

## 2019-12-07 DIAGNOSIS — K625 Hemorrhage of anus and rectum: Secondary | ICD-10-CM

## 2019-12-07 DIAGNOSIS — E119 Type 2 diabetes mellitus without complications: Secondary | ICD-10-CM | POA: Diagnosis present

## 2019-12-07 LAB — POCT GLYCOSYLATED HEMOGLOBIN (HGB A1C): HbA1c, POC (controlled diabetic range): 8.5 % — AB (ref 0.0–7.0)

## 2019-12-07 MED ORDER — ALBUTEROL SULFATE HFA 108 (90 BASE) MCG/ACT IN AERS: INHALATION_SPRAY | RESPIRATORY_TRACT | 6 refills | Status: DC

## 2019-12-07 MED ORDER — METFORMIN HCL 500 MG PO TABS: 500.0000 mg | ORAL_TABLET | Freq: Two times a day (BID) | ORAL | 2 refills | Status: DC

## 2019-12-07 NOTE — Patient Instructions (Addendum)
Good to see you today!    For the Bleeding  Will check a vaginal ultrasound   Will refer your for a colonoscopy  Will check labs today  Take metformin 500 mg once a day for 3-4 days then go up to twice a day  Let me know if any problems  Come back in 3 months for a diabetes check or sooner If any problems

## 2019-12-07 NOTE — Assessment & Plan Note (Signed)
Unsure if bleeding for last year is vaginal or rectal and body habitus prevents good exam.  Could be hemmrhoids or possible colon or uterine neoplasia.   Will refer for colonoscopy and check pelvic US

## 2019-12-07 NOTE — Progress Notes (Signed)
    SUBJECTIVE:   CHIEF COMPLAINT / HPI:   DIABETES Disease Monitoring: Blood Sugar range-not checking   Medications Adherence off all medications for at least a year Hypoglycemic symptoms- no    PELVIC BLEEDING For last year has had episodes of bleeding.  Unsure if rectal or vaginal.  Episodic will have for a few days then none for a week then returns.  No large amounts.  LMP - 10 years ago.  No menstrual period like feelings.  Does have blood on toilet paper but not in stool or bowel.  R HIP PAIN Got out of bed last week and felt pop and then was painful for a few days but much improved almost gone today.  No weakness or brusiing   PERTINENT  PMH / PSH: has been isolating during covid due to 60 yo mother  OBJECTIVE:   BP 132/80   Pulse 82   Ht 5\' 3"  (1.6 m)   Wt (!) 419 lb 12.8 oz (190.4 kg)   SpO2 97%   BMI 74.36 kg/m   Good spirits Mobility:able to get up and down from exam table without assistance or distress Lungs:  Distant breath sounds.  End exp wheeze diffusely  Heart - Regular rate and rhythm.  No murmurs, gallops or rubs.    Extremities:  No cyanosis, edema, or deformity noted with good range of motion of all major joints.     ASSESSMENT/PLAN:   Rectal bleeding Unsure if bleeding for last year is vaginal or rectal and body habitus prevents good exam.  Could be hemmrhoids or possible colon or uterine neoplasia.   Will refer for colonoscopy and check pelvic US  Diabetes Not at goal.  Has been off all medications.  Restart low dose metformin.  Check blood tests.  May be good candidate for GLP1  Vaginal bleeding Unsure if bleeding for last year is vaginal or rectal and body habitus prevents good exam.  Could be hemmrhoids or possible colon or uterine neoplasia.   Will refer for colonoscopy and check pelvic US   R HIP PAIN - resolving on own. No further work up unless returns   Lind Covert, [place]

## 2019-12-07 NOTE — Assessment & Plan Note (Signed)
Not at goal.  Has been off all medications.  Restart low dose metformin.  Check blood tests.  May be good candidate for GLP1

## 2019-12-08 LAB — CMP14+EGFR
ALT: 14 IU/L (ref 0–32)
AST: 15 IU/L (ref 0–40)
Albumin/Globulin Ratio: 0.9 — ABNORMAL LOW (ref 1.2–2.2)
Albumin: 3.7 g/dL — ABNORMAL LOW (ref 3.8–4.9)
Alkaline Phosphatase: 116 IU/L (ref 48–121)
BUN/Creatinine Ratio: 14 (ref 9–23)
BUN: 10 mg/dL (ref 6–24)
Bilirubin Total: 0.2 mg/dL (ref 0.0–1.2)
CO2: 29 mmol/L (ref 20–29)
Calcium: 9.1 mg/dL (ref 8.7–10.2)
Chloride: 99 mmol/L (ref 96–106)
Creatinine, Ser: 0.74 mg/dL (ref 0.57–1.00)
GFR calc Af Amer: 104 mL/min/{1.73_m2} (ref >59)
GFR calc non Af Amer: 90 mL/min/{1.73_m2} (ref >59)
Globulin, Total: 4.3 g/dL (ref 1.5–4.5)
Glucose: 184 mg/dL — ABNORMAL HIGH (ref 65–99)
Potassium: 4.9 mmol/L (ref 3.5–5.2)
Sodium: 141 mmol/L (ref 134–144)
Total Protein: 8 g/dL (ref 6.0–8.5)

## 2019-12-08 LAB — CBC
Hematocrit: 41.9 % (ref 34.0–46.6)
Hemoglobin: 13.5 g/dL (ref 11.1–15.9)
MCH: 28 pg (ref 26.6–33.0)
MCHC: 32.2 g/dL (ref 31.5–35.7)
MCV: 87 fL (ref 79–97)
Platelets: 250 10*3/uL (ref 150–450)
RBC: 4.83 x10E6/uL (ref 3.77–5.28)
RDW: 15.7 % — ABNORMAL HIGH (ref 11.7–15.4)
WBC: 7.7 10*3/uL (ref 3.4–10.8)

## 2019-12-14 ENCOUNTER — Ambulatory Visit (HOSPITAL_COMMUNITY): Payer: Medicaid Other

## 2020-01-24 ENCOUNTER — Telehealth: Payer: Self-pay

## 2020-01-24 DIAGNOSIS — Z9181 History of falling: Secondary | ICD-10-CM

## 2020-01-24 NOTE — Telephone Encounter (Signed)
Patient calls nurse line regarding receiving a DME order for a shower chair. Please let "RN Team" know once order is placed so we can begin processing.   Talbot Grumbling, RN

## 2020-01-24 NOTE — Telephone Encounter (Signed)
Sent patient MyChart message.

## 2020-02-01 NOTE — Telephone Encounter (Signed)
Patient returns call to nurse line regarding shower chair DME. Patient reports " bone popping" in R hip and would like shower chair for safety. Patient will also need heavy duty shower chair (greater than 400 pounds).   Patient reports good compliance on metformin. Patient reports that she has not yet got pelvic ultrasound as she wants to limit exposures due to increasing in COVID cases.   To PCP  Talbot Grumbling, RN

## 2020-02-01 NOTE — Telephone Encounter (Signed)
I am covering for Dr. Erin Hearing who is away from the office.  Ordered shower chair.  As patient is concerned about surge in [place] cases, strongly advise patient be vaccinated against COVID if she has not yet been vaccinated.  Leeanne Rio, MD

## 2020-02-01 NOTE — Telephone Encounter (Signed)
Community message has been sent to [employer].

## 2020-02-01 NOTE — Addendum Note (Signed)
Addended by: Leeanne Rio on: 02/01/2020 02:09 PM   Modules accepted: Orders

## 2020-02-01 NOTE — Telephone Encounter (Signed)
Deborah Benson, [name]; [name]   received, thanks

## 2020-04-26 ENCOUNTER — Other Ambulatory Visit: Payer: Self-pay

## 2020-04-26 ENCOUNTER — Ambulatory Visit (INDEPENDENT_AMBULATORY_CARE_PROVIDER_SITE_OTHER): Payer: Medicaid Other | Admitting: Family Medicine

## 2020-04-26 VITALS — BP 134/70 | HR 78 | Ht 63.0 in | Wt >= 6400 oz

## 2020-04-26 DIAGNOSIS — M25562 Pain in left knee: Secondary | ICD-10-CM

## 2020-04-26 DIAGNOSIS — G8929 Other chronic pain: Secondary | ICD-10-CM

## 2020-04-26 DIAGNOSIS — E119 Type 2 diabetes mellitus without complications: Secondary | ICD-10-CM | POA: Diagnosis not present

## 2020-04-26 DIAGNOSIS — Z6841 Body Mass Index (BMI) 40.0 and over, adult: Secondary | ICD-10-CM

## 2020-04-26 DIAGNOSIS — M79672 Pain in left foot: Secondary | ICD-10-CM | POA: Diagnosis not present

## 2020-04-26 LAB — POCT GLYCOSYLATED HEMOGLOBIN (HGB A1C): Hemoglobin A1C: 9 % — AB (ref 4.0–5.6)

## 2020-04-26 MED ORDER — METFORMIN HCL ER 500 MG PO TB24: 500.0000 mg | ORAL_TABLET | Freq: Two times a day (BID) | ORAL | 3 refills | Status: DC

## 2020-04-26 MED ORDER — SEMAGLUTIDE(0.25 OR 0.5MG/DOS) 2 MG/1.5ML ~~LOC~~ SOPN: 0.2500 mg | PEN_INJECTOR | SUBCUTANEOUS | 3 refills | Status: DC

## 2020-04-26 NOTE — Patient Instructions (Signed)
Knee pain: It sounds like this is related to not having quite enough leg strength to keep your kneecap in place.  I have sent you to physical therapy for additional strengthening exercises which may be helpful.  Foot pain: I think this is most likely arthritis of your ankles.  Because of the pain on the left side of your foot, we will get a get an x-ray of that 1 foot to make sure there is no evidence of fracture.  Overweight: I do think that your weight is contributing to your knee pain and foot pain.  I think that will be helpful to start you on a medication called semaglutide.  This is a once weekly injection.  We are going to start at a low dose and increase your dose by 0.25 mg each week until we get to a dose of 1 mg.  I recommend that you try to schedule frequent visits with Dr. Erin Hearing (may be every month or so) to help you stay motivated for weight loss.  Diabetes: Please start taking your Metformin and also start taking the semaglutide injections.  I have changed the Metformin formulation to the kind that has fewer side effects.

## 2020-04-26 NOTE — Assessment & Plan Note (Signed)
Slightly worsened A1c today at 9.0.  She is not currently taking any medication. -Metformin XR 500 mg twice daily sent to pharmacy.  She was encouraged to try this medicine because often has a better side effect profile. -Start semaglutide 0.25 mg weekly injections.  Increase by 0.25 mg weekly until it dose of 1.0 mg weekly is reached. -Return to clinic in 1 month

## 2020-04-26 NOTE — Progress Notes (Signed)
SUBJECTIVE:   CHIEF COMPLAINT / HPI:   Left ankle pain She has a small variety of musculoskeletal issues but has had no ankle pain in the past 1-2 weeks.  She does not remember any specific trauma leading to this discomfort.  Her pain started off as a sharp pain in the front of her left ankle which would shoot up the front of her leg.  That pain seems to have resolved but now she gets sharp discomfort on her heel.  She notices this most with activity like walking around.  She does not have any specific complaints but difficulty walking up stairs.  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  She has been seen in clinic for this before.  She continues to describe an achy knee pain as though her kneecap is slid out of place.  She occasionally has a sensation as though her knee falls back into place and her discomfort resolves.  She is previously been told that strengthening of her thighs may be helpful although she is not formally seen physical therapy.  Obesity She is aware that her weight is certainly contributing to some of the above-noted problems and she is interested in methods to help reduce her weight.  She is previously tried to lose weight and lost as much as 60 pounds without is difficult for her to keep the weight off.  Diabetes She has been prescribed Metformin although she does not take Metformin.  She does not take any medication regularly.  She reports that Metformin seems to give her blurred vision.  PERTINENT  PMH / PSH: Obesity, diabetes, tobacco use, bilateral leg pain  OBJECTIVE:   BP 134/70   Pulse 78   Ht 5\' 3"  (1.6 m)   Wt (!) 421 lb 2 oz (191 kg)   SpO2 96%   BMI 74.60 kg/m    General: Seated comfortably in her chair in the exam room.  No acute distress.  Able to walk around the exam room comfortably.  Difficulty stepping up on the exam table.  The musculoskeletal assessment was done with her seated in the chair in the exam room. Respiratory: Breathing  comfortably on room air.  No respiratory distress.  Knees: Inspection: No obvious deformity, erythema or swelling. Palpation: No significant tenderness with palpation of the patellae.  Left ankle Inspection: No obvious deformity, swelling or skin changes Palpation: No tenderness with palpation of the malleoli, calcaneus or metatarsal squeeze test.  Some tenderness with palpation of the base of the fifth metatarsal. Neurovascularly intact Normal ROM.  Right ankle Inspection: No obvious deformity, swelling or skin changes Palpation: No tenderness with palpation of the malleoli, calcaneus or metatarsal squeeze test.   Neurovascularly intact Normal ROM.   ASSESSMENT/PLAN:   Left knee pain Anterior femoral knee pain.  This from does not seem to have improved without intervention.  I think it may be helpful to send her to physical therapy for formal treatment. -Referral to physical therapy for quadricep strengthening exercises  Left foot pain Most likely arthritis.  Stress fracture is also possible.  Low suspicion for other fractures based on microtrauma.  There is some tenderness at the base of her left fifth metatarsal so we will evaluate with plain films for now.  We did also discuss weight loss. -Follow-up foot x-rays  Obesity She is aware that this is contributing to her musculoskeletal issues.  She has previously been seen by bariatric surgery and had some weight loss success in the  past.  She is interested in medicine that may be helpful.  Please see our diabetes problem for additional information.  She is encouraged to follow regularly with her PCP for encouragement and additional help with weight loss.  Diabetes Slightly worsened A1c today at 9.0.  She is not currently taking any medication. -Metformin XR 500 mg twice daily sent to pharmacy.  She was encouraged to try this medicine because often has a better side effect profile. -Start semaglutide 0.25 mg weekly injections.   Increase by 0.25 mg weekly until it dose of 1.0 mg weekly is reached. -Return to clinic in 1 month     Deborah Haymaker, MD [place]

## 2020-04-26 NOTE — Assessment & Plan Note (Signed)
She is aware that this is contributing to her musculoskeletal issues.  She has previously been seen by bariatric surgery and had some weight loss success in the past.  She is interested in medicine that may be helpful.  Please see our diabetes problem for additional information.  She is encouraged to follow regularly with her PCP for encouragement and additional help with weight loss.

## 2020-04-26 NOTE — Assessment & Plan Note (Signed)
Anterior femoral knee pain.  This from does not seem to have improved without intervention.  I think it may be helpful to send her to physical therapy for formal treatment. -Referral to physical therapy for quadricep strengthening exercises

## 2020-04-26 NOTE — Assessment & Plan Note (Signed)
Most likely arthritis.  Stress fracture is also possible.  Low suspicion for other fractures based on microtrauma.  There is some tenderness at the base of her left fifth metatarsal so we will evaluate with plain films for now.  We did also discuss weight loss. -Follow-up foot x-rays

## 2020-05-12 ENCOUNTER — Telehealth: Payer: Self-pay | Admitting: *Deleted

## 2020-05-12 ENCOUNTER — Other Ambulatory Visit: Payer: Self-pay | Admitting: *Deleted

## 2020-05-12 NOTE — Telephone Encounter (Signed)
Pt just now attemtped to pick up script for ozempic (04/26/20) this requires a PA  Clinical questions submitted via Cover My Meds.  Waiting on response, could take up to 72 hours.  Cover My Meds info: Key: [id number]  Christen Bame, CMA

## 2020-05-12 NOTE — Telephone Encounter (Signed)
PA denied because trial and failure of of 2 formulary drugs are required first.  See below:

## 2020-05-15 ENCOUNTER — Other Ambulatory Visit: Payer: Self-pay | Admitting: Family Medicine

## 2020-05-15 ENCOUNTER — Other Ambulatory Visit: Payer: Self-pay | Admitting: *Deleted

## 2020-05-15 MED ORDER — TRULICITY 0.75 MG/0.5ML ~~LOC~~ SOAJ: 0.7500 mg | SUBCUTANEOUS | 3 refills | Status: DC

## 2020-05-15 MED ORDER — TRULICITY 0.75 MG/0.5ML ~~LOC~~ SOAJ
0.7500 mg | SUBCUTANEOUS | 3 refills | Status: DC
Start: 2020-05-15 — End: 2020-10-05

## 2020-05-15 NOTE — Telephone Encounter (Signed)
LM for patient ok per DPR with message from MD.  Deborah Benson

## 2020-05-15 NOTE — Telephone Encounter (Signed)
Please call Ms. Rivere and let her know that I have sent in a new prescription for diabetes.  This is the same kind of medication that we were planning to start (GLP-1 agonist) but this medication is covered by her insurance so she should not have trouble getting it.  She should take it once weekly and return to clinic in 1 month to see how she is doing.  Thank you, Matilde Haymaker, MD

## 2020-05-16 ENCOUNTER — Other Ambulatory Visit: Payer: Self-pay

## 2020-05-16 NOTE — Telephone Encounter (Signed)
Patient calls nurse line reporting she was told Gabapentin was going to be called in. Patient states the pharmacy does not have this prescription. I do not see where it was called in, I do see where it was discontinued. Will forward to provider who last saw patient.

## 2020-05-22 ENCOUNTER — Ambulatory Visit: Payer: Medicaid Other | Attending: Family Medicine | Admitting: Physical Therapy

## 2020-05-22 ENCOUNTER — Other Ambulatory Visit: Payer: Self-pay

## 2020-05-22 ENCOUNTER — Encounter: Payer: Self-pay | Admitting: Physical Therapy

## 2020-05-22 DIAGNOSIS — M6281 Muscle weakness (generalized): Secondary | ICD-10-CM | POA: Insufficient documentation

## 2020-05-22 DIAGNOSIS — G8929 Other chronic pain: Secondary | ICD-10-CM | POA: Insufficient documentation

## 2020-05-22 DIAGNOSIS — M25561 Pain in right knee: Secondary | ICD-10-CM | POA: Insufficient documentation

## 2020-05-22 DIAGNOSIS — M25562 Pain in left knee: Secondary | ICD-10-CM | POA: Diagnosis not present

## 2020-05-22 NOTE — Therapy (Addendum)
[place], [place], [address] Phone: [phone]   Fax:  [phone]  Physical Therapy Evaluation  Patient Details  Name: Deborah Benson MRN: [id number] Date of Birth: [birth year] Referring Provider (PT): Martyn Malay, MD   Encounter Date: 05/22/2020   PT End of Session - 05/22/20 1227    Visit Number 1    Number of Visits 13    Date for PT Re-Evaluation 07/03/20    Authorization Type [employer]    PT Start Time 5366    PT Stop Time 1225    PT Time Calculation (min) 40 min    Activity Tolerance Patient tolerated treatment well    Behavior During Therapy [facility] for tasks assessed/performed           Past Medical History:  Diagnosis Date  . Arthritis   . Asthma   . Depression   . Diabetes mellitus without complication ([place])    oral meds only  . Gout   . HTN (hypertension)    "borderline" med was stopped by pt- MD aware.  . Neuromuscular disorder ([place])    neuropathy -hands/ feet  . Neuropathy   . Obesity    BMI >76.6    Past Surgical History:  Procedure Laterality Date  . FOOT SURGERY     due to needle break in foot  . TONSILLECTOMY      There were no vitals filed for this visit.    Subjective Assessment - 05/22/20 1153    Subjective pt is a 59 y.o with bil knee pain with the L being worse than the R begining a couple months ago, and reports she fell a couple months ago noted a pop and it hurt the knee but the pain gradually went away.. she reports it pops out of socket and feels like it is more when it is popping back in. she notes the neuropathy effects her thighs occuring mostly at night. she denis any hx or knee pain. she reports the L hip started bothering around the same time the L knee did.    How long can you sit comfortably? 15 - 20 min    How long can you stand comfortably? 10 min    How long can you walk comfortably? 20 min    Diagnostic tests N/A    Patient Stated  Goals figure out what is going on, how to manage the pain, stay mobile    Currently in Pain? Yes    Pain Score 5    at worst 10/10   Pain Orientation Left;Right   L knee is worse   Pain Descriptors / Indicators Aching;Throbbing   irritating   Pain Type Chronic pain    Pain Onset More than a month ago    Pain Frequency Constant    Aggravating Factors  standing/ walking, sitting for long periods, getting into the bath with lifting    Pain Relieving Factors getting into bath tub, massager on the shower head.    Effect of Pain on Daily Activities limited standing/ walking, standing,              [facility] PT Assessment - 05/22/20 1200      Assessment   Medical Diagnosis Chronic pain of left knee (M25.562, G89.29)    Referring Provider (PT) Martyn Malay, MD    Onset Date/Surgical Date --   a few months   Hand Dominance Left    Next MD Visit make on  PRN    Prior Therapy no      Precautions   Precautions None      Restrictions   Weight Bearing Restrictions No      Balance Screen   Has the patient fallen in the past 6 months No   couple near falls     [place] to enter    Entrance Stairs-Number of Steps 3    Entrance Stairs-Rails Right    Home Layout One level    [place] bars - tub/shower;Shower seat;Cane - quad      Prior Function   Level of Independence Independent    Vocation On disability      Cognition   Overall Cognitive Status Within Functional Limits for tasks assessed      ROM / Strength   AROM / PROM / Strength AROM;PROM;Strength      AROM   Overall AROM Comments limited AROM secondary to soft tissue approximation    AROM Assessment Site Knee;Hip    Right/Left Knee Left;Right    Right Knee Extension 100    Right Knee Flexion 5    Left Knee Extension 5    Left Knee Flexion 82      PROM   PROM Assessment Site Knee     Right/Left Knee Right;Left    Right Knee Extension 2    Right Knee Flexion 108    Left Knee Extension 2    Left Knee Flexion 92      Strength   Strength Assessment Site Knee;Hip    Right/Left Hip Right;Left    Right Hip Flexion 4+/5    Right Hip ABduction 4/5    Right Hip ADduction 4+/5    Left Hip Flexion 4+/5    Left Hip ABduction 4/5    Left Hip ADduction 4+/5    Right/Left Knee Right;Left    Right Knee Flexion 4+/5    Right Knee Extension 4+/5    Left Knee Flexion 4+/5    Left Knee Extension 4+/5      Palpation   Palpation comment TTP along the lateral patellar and lateral femoral condyle bil   limited palpation secondary to body habitus     Ambulation/Gait   Ambulation/Gait Yes    Gait Pattern Decreased stride length;Antalgic;Wide base of support                      Objective measurements completed on examination: See above findings.                 PT Short Term Goals - 05/22/20 1239      PT SHORT TERM GOAL #1   Title pt to be IND with inital HEP    Baseline no previous HEP    Time 3    Period Weeks    Status New    Target Date 06/12/20      PT SHORT TERM GOAL #2   Title pt to verbalize and demonstrate techniques to reduce bil knee pain via RICE and HEP    Baseline lmited pain relief techniques    Time 3    Period Weeks    Status New    Target Date 06/12/20             PT Long Term Goals - 05/22/20 1240  PT LONG TERM GOAL #1   Title increase L knee flexion to >/= 120 degrees for functional ROM required for ADLS with </= 5/85 max pain    Baseline see flowsheet    Time 6    Period Weeks    Status New    Target Date 07/03/20      PT LONG TERM GOAL #2   Title increase bil LE gross strength to >/= 4+/5 to promote patellar stability to    Baseline see flowsheet    Time 6    Period Weeks    Status New    Target Date 07/03/20      PT LONG TERM GOAL #3   Title to be able to sit , stand and walk for for >/= 45 min to  promote in home and community ambulatoin    Baseline sit 10-20, standing 10 min, walking 15-20    Time 6    Period Weeks    Status New    Target Date 07/03/20      PT LONG TERM GOAL #4   Title pt to be IND with all HEP and is able to maintain and progress current LOF IND    Baseline no previous HEP    Time 6    Period Weeks    Status New    Target Date 07/03/20                  Plan - 05/22/20 1231    Clinical Impression Statement pt presents to OPPT with CC of bil knee pain with R worse than L starting a couple months ago with no specific mechanism noted, pt did report a pop with pain that had resolved previously. limited ROM bil with L worse than R due to pain/ guarding a soft tissue approximation, and general weakness noted bil. limited palpation and testing secondary to body habitius, based on assessment and testing suggests potential patellofemoral involvement. She would benefit from physical therapy to decrease bil knee pain, increase strength / stability and overall function by addressing the defictis liste.d    Personal Factors and Comorbidities Comorbidity 3+    Comorbidities hx of neuropathy, DM, gout, depression    Examination-Activity Limitations Stairs;Stand;Sit    Stability/Clinical Decision Making Evolving/Moderate complexity    Clinical Decision Making Moderate    Rehab Potential Good    PT Frequency 2x / week    PT Duration 6 weeks    PT Treatment/Interventions ADLs/Self Care Home, Aquatic Therapy, Management;Cryotherapy;Electrical Stimulation;Moist Heat;Ultrasound;Gait training;Stair training;Functional mobility training;Therapeutic activities;Therapeutic exercise;Balance training;Neuromuscular re-education;Patient/family education;Manual techniques;Passive range of motion;Dry needling;Taping    PT Next Visit Plan review/ update HEP, gross hipo strengthening with empshasi on abductors, gait training, strengthening to stabliize patellorfemoral joint, modalitites  PRN    PT Home Exercise Plan [id number]- quad set with ball squeeze, LAQ, sit to stand, clamshells    Consulted and Agree with Plan of Care Patient           Patient will benefit from skilled therapeutic intervention in order to improve the following deficits and impairments:  Obesity,Abnormal gait,Decreased balance,Decreased endurance,Decreased range of motion,Decreased strength,Improper body mechanics,Postural dysfunction,Increased muscle spasms,Pain  Visit Diagnosis: Chronic pain of left knee  Chronic pain of right knee  Muscle weakness (generalized)     Problem List Patient Active Problem List   Diagnosis Date Noted  . Left foot pain 04/26/2020  . Vaginal bleeding 12/07/2019  . Rectal bleeding 12/07/2019  . Left knee pain 12/19/2016  . Screening for diabetic  retinopathy 10/30/2016  . Plantar wart 09/11/2016  . Special screening for malignant neoplasms, colon 01/22/2016  . BMI 70 and over, adult ([place]) 01/22/2016  . Insomnia 03/08/2013  . Hypertension 10/16/2009  . TOBACCO USE 12/01/2008  . LEG PAIN, BILATERAL 12/01/2008  . Diabetes ([place]) 07/11/2008  . Obesity 08/07/2006  . ASTHMA, INTERMITTENT 08/07/2006  . Candiss Norse SITES 08/07/2006   Starr Lake PT, DPT, LAT, ATC  05/22/20  12:45 PM      [facility] [address] [place], [place], [address] Phone: [phone]   Fax:  [phone]  Name: Deborah Benson MRN: [id number] Date of Birth: 12/19/1960            Check all possible CPT codes: [phone] - Aquatic therapy 97110- Therapeutic Exercise, [phone]- Neuro Re-education, [id number] - Gait Training, [phone] - Manual Therapy, 51025 - Therapeutic Activities, [phone] - Self Care, 82423 - Electrical stimulation (unattended), [id number] - Iontophoresis, [id number] - Ultrasound and 53614 - Physical performance training        Ben Habermann PT, DPT, LAT, ATC  06/08/20  9:12 AM

## 2020-06-05 ENCOUNTER — Telehealth: Payer: Self-pay | Admitting: Family Medicine

## 2020-06-05 NOTE — Telephone Encounter (Signed)
[employer] is calling to check on forms from the 2nd. Forms are in DR. Chambliss's box. Forwarding  to covering provider. Thanks

## 2020-06-06 ENCOUNTER — Encounter: Payer: Self-pay | Admitting: Physical Therapy

## 2020-06-06 ENCOUNTER — Ambulatory Visit: Payer: Medicaid Other | Admitting: Physical Therapy

## 2020-06-06 ENCOUNTER — Other Ambulatory Visit: Payer: Self-pay

## 2020-06-06 DIAGNOSIS — M25562 Pain in left knee: Secondary | ICD-10-CM

## 2020-06-06 DIAGNOSIS — M6281 Muscle weakness (generalized): Secondary | ICD-10-CM

## 2020-06-06 DIAGNOSIS — G8929 Other chronic pain: Secondary | ICD-10-CM

## 2020-06-06 NOTE — Therapy (Signed)
[facility] [address] [place], [place], [address] Phone: [phone]   Fax:  [phone]  Physical Therapy Treatment  Patient Details  Name: Deborah Benson MRN: [id number] Date of Birth: [birth year] Referring Provider (PT): Westley Chandler, MD   Encounter Date: 06/06/2020   PT End of Session - 06/06/20 1322    Visit Number 2    Number of Visits 13    Date for PT Re-Evaluation 07/03/20    Authorization Type [employer] M[employer]- sumbitted    PT Start Time 1317    PT Stop Time 1355    PT Time Calculation (min) 38 min           Past Medical History:  Diagnosis Date  . Arthritis   . Asthma   . Depression   . Diabetes mellitus without complication ([facility])    oral meds only  . Gout   . HTN (hypertension)    "borderline" med was stopped by pt- MD aware.  . Neuromuscular disorder ([facility])    neuropathy -hands/ feet  . Neuropathy   . Obesity    BMI >76.6    Past Surgical History:  Procedure Laterality Date  . FOOT SURGERY     due to needle break in foot  . TONSILLECTOMY      There were no vitals filed for this visit.   Subjective Assessment - 06/06/20 1319    Subjective Pt reports Left knee pain 5/10 and 3/10 on right.                             [facility] Adult PT Treatment/Exercise - 06/06/20 0001      Knee/Hip Exercises: Aerobic   Nustep L4 LE only x 5 minutes      Knee/Hip Exercises: Seated   Long Arc Quad 20 reps    Heel Slides Limitations cloth on floor x 20 each      Knee/Hip Exercises: Supine   Quad Sets Limitations with ball squeeze    Short Arc Quad Sets 20 reps    Short Arc Quad Sets Limitations over low bolster    Bridges 20 reps    Straight Leg Raises 10 reps;Right    Straight Leg Raises Limitations left -unable    Other Supine Knee/Hip Exercises supine bent knee raise x 5 x 2      Knee/Hip Exercises: Sidelying   Clams x 10 x2                    PT Short Term Goals  - 05/22/20 1239      PT SHORT TERM GOAL #1   Title pt to be IND with inital HEP    Baseline no previous HEP    Time 3    Period Weeks    Status New    Target Date 06/12/20      PT SHORT TERM GOAL #2   Title pt to verbalize and demonstrate techniques to reduce bil knee pain via RICE and HEP    Baseline lmited pain relief techniques    Time 3    Period Weeks    Status New    Target Date 06/12/20             PT Long Term Goals - 05/22/20 1240      PT LONG TERM GOAL #1   Title increase L knee flexion to >/= 120 degrees for functional ROM required for ADLS  with </= 2/10 max pain    Baseline see flowsheet    Time 6    Period Weeks    Status New    Target Date 07/03/20      PT LONG TERM GOAL #2   Title increase bil LE gross strength to >/= 4+/5 to promote patellar stability to    Baseline see flowsheet    Time 6    Period Weeks    Status New    Target Date 07/03/20      PT LONG TERM GOAL #3   Title to be able to sit , stand and walk for for >/= 45 min to promote in home and community ambulatoin    Baseline sit 10-20, standing 10 min, walking 15-20    Time 6    Period Weeks    Status New    Target Date 07/03/20      PT LONG TERM GOAL #4   Title pt to be IND with all HEP and is able to maintain and progress current LOF IND    Baseline no previous HEP    Time 6    Period Weeks    Status New    Target Date 07/03/20                 Plan - 06/06/20 1354    Clinical Impression Statement Pt reports knee pain today. SHe is performing her HEP. Weakness evident on left  with mat therex. She is unable to SLR from mat on the left. Worked on LE strength and ROM as tolearted. She is interested in adquatics. Will check insurance coverage.    Comorbidities hx of neuropathy, DM, gout, depression    PT Next Visit Plan review/ update HEP, gross hipo strengthening with empshasi on abductors, gait training, strengthening to stabliize patellorfemoral joint, modalitites PRN, pt  is interested in aquatics-checking coverage    PT Home Exercise Plan [id number]- quad set with ball squeeze, LAQ, sit to stand, clamshells           Patient will benefit from skilled therapeutic intervention in order to improve the following deficits and impairments:  Obesity,Abnormal gait,Decreased balance,Decreased endurance,Decreased range of motion,Decreased strength,Improper body mechanics,Postural dysfunction,Increased muscle spasms,Pain  Visit Diagnosis: Chronic pain of left knee  Chronic pain of right knee  Muscle weakness (generalized)     Problem List Patient Active Problem List   Diagnosis Date Noted  . Left foot pain 04/26/2020  . Vaginal bleeding 12/07/2019  . Rectal bleeding 12/07/2019  . Left knee pain 12/19/2016  . Screening for diabetic retinopathy 10/30/2016  . Plantar wart 09/11/2016  . Special screening for malignant neoplasms, colon 01/22/2016  . BMI 70 and over, adult ([place]) 01/22/2016  . Insomnia 03/08/2013  . Hypertension 10/16/2009  . TOBACCO USE 12/01/2008  . LEG PAIN, BILATERAL 12/01/2008  . Diabetes ([place]) 07/11/2008  . Obesity 08/07/2006  . ASTHMA, INTERMITTENT 08/07/2006  . OSTEOARTHRITIS, MULTI SITES 08/07/2006    [name], PTA 06/06/2020, 2:16 PM  [facility] [address] [place], [place], [address] Phone: [phone]   Fax:  [phone]  Name: Deborah Benson MRN: [id number] Date of Birth: 1960/08/31

## 2020-06-07 NOTE — Telephone Encounter (Signed)
I am covering for Dr. Deirdre Priest who is away from the office.  Forms received are for [employer] [facility], request for diapers and chux. Called patient to discuss, since I couldn't find any evidence of urinary incontinence in her record. Patient reports she saw a commercial on TV and wanted to get these supplies; has never been seen by a physician for incontinence in the past.  Reports urge incontinence, when stands up to go to bathroom she is already urinating some, ongoing for several months. Advised that we should ideally see her in person to check UA and hear more about issues prior to prescribing incontinence supplies. Appointment scheduled with PCP on 06/20/20. Will leave forms in PCP's box.  I also took the opportunity to address several outstanding health maintenance items: -Advised patient to get COVID booster - offered to scheduled appointment for her but she will get it at a drug store. -She declined influenza vaccine. -Reminded her of being due for mammogram and pap smear - she will discuss these with PCP at upcoming visit. -Advised she call and schedule her diabetic eye exam.  FYI to Dr. Deirdre Priest.  Latrelle Dodrill, MD

## 2020-06-08 ENCOUNTER — Ambulatory Visit: Payer: Medicaid Other | Admitting: Physical Therapy

## 2020-06-08 NOTE — Addendum Note (Signed)
Addended by: Milford Cage on: 06/08/2020 09:14 AM   Modules accepted: Orders

## 2020-06-12 ENCOUNTER — Telehealth: Payer: Self-pay | Admitting: Physical Therapy

## 2020-06-12 ENCOUNTER — Ambulatory Visit: Payer: Medicaid Other | Attending: Family Medicine | Admitting: Physical Therapy

## 2020-06-12 DIAGNOSIS — M25561 Pain in right knee: Secondary | ICD-10-CM | POA: Insufficient documentation

## 2020-06-12 DIAGNOSIS — M25562 Pain in left knee: Secondary | ICD-10-CM | POA: Insufficient documentation

## 2020-06-12 DIAGNOSIS — M6281 Muscle weakness (generalized): Secondary | ICD-10-CM | POA: Insufficient documentation

## 2020-06-12 DIAGNOSIS — G8929 Other chronic pain: Secondary | ICD-10-CM | POA: Insufficient documentation

## 2020-06-12 NOTE — Telephone Encounter (Signed)
Left voicemail regarding no show to appointment and left next appointment date and time.

## 2020-06-14 ENCOUNTER — Ambulatory Visit: Payer: Medicaid Other | Admitting: Physical Therapy

## 2020-06-14 ENCOUNTER — Other Ambulatory Visit: Payer: Self-pay

## 2020-06-14 DIAGNOSIS — M6281 Muscle weakness (generalized): Secondary | ICD-10-CM | POA: Diagnosis present

## 2020-06-14 DIAGNOSIS — G8929 Other chronic pain: Secondary | ICD-10-CM

## 2020-06-14 DIAGNOSIS — M25561 Pain in right knee: Secondary | ICD-10-CM | POA: Diagnosis present

## 2020-06-14 DIAGNOSIS — M25562 Pain in left knee: Secondary | ICD-10-CM | POA: Diagnosis present

## 2020-06-14 NOTE — Therapy (Addendum)
[place], [place], [address] Phone: [phone]   Fax:  [phone]  Physical Therapy Treatment / Discharge  Patient Details  Name: Deborah Benson MRN: [id number] Date of Birth: 06-13-1960 Referring Provider (PT): Martyn Malay, MD   Encounter Date: 06/14/2020   PT End of Session - 06/14/20 1151    Visit Number 3    Number of Visits 13    Date for PT Re-Evaluation 07/03/20    Authorization Type [employer]- approved for 12 visits    Authorization Time Period 06/06/20-07/18/19    Authorization - Visit Number 2    Authorization - Number of Visits 12    PT Start Time 5885    PT Stop Time 1228    PT Time Calculation (min) 43 min           Past Medical History:  Diagnosis Date  . Arthritis   . Asthma   . Depression   . Diabetes mellitus without complication ([place])    oral meds only  . Gout   . HTN (hypertension)    "borderline" med was stopped by pt- MD aware.  . Neuromuscular disorder ([place])    neuropathy -hands/ feet  . Neuropathy   . Obesity    BMI >76.6    Past Surgical History:  Procedure Laterality Date  . FOOT SURGERY     due to needle break in foot  . TONSILLECTOMY      There were no vitals filed for this visit.                      [facility] Adult PT Treatment/Exercise - 06/14/20 0001      Knee/Hip Exercises: Seated   Long Arc Quad 20 reps    Heel Slides Limitations cloth on floor x 20 each    Marching 10 reps;2 sets      Knee/Hip Exercises: Supine   Quad Sets 20 reps    Quad Sets Limitations with ball    Short Arc Quad Sets 20 reps    Short Arc Quad Sets Limitations over high bolster 10 x 2 each    Bridges 10 reps;2 sets    Straight Leg Raises Limitations 7 reps left, 10 reps right    Other Supine Knee/Hip Exercises clam AROM x 20    Other Supine Knee/Hip Exercises supine bent knee raise x 10 x 2      Knee/Hip Exercises: Sidelying   Hip ABduction 10  reps    Clams x 10 x2                  PT Education - 06/14/20 1219    Education Details Aquatics Information, HEP    Person(s) Educated Patient    Methods Explanation;Handout    Comprehension Verbalized understanding            PT Short Term Goals - 06/14/20 1215      PT SHORT TERM GOAL #1   Title pt to be IND with inital HEP    Baseline compliant and independent    Status Achieved      PT SHORT TERM GOAL #2   Title pt to verbalize and demonstrate techniques to reduce bil knee pain via RICE and HEP    Baseline lmited pain relief techniques    Time 3    Period Weeks    Status On-going             PT  Long Term Goals - 05/22/20 1240      PT LONG TERM GOAL #1   Title increase L knee flexion to >/= 120 degrees for functional ROM required for ADLS with </= 0/38 max pain    Baseline see flowsheet    Time 6    Period Weeks    Status New    Target Date 07/03/20      PT LONG TERM GOAL #2   Title increase bil LE gross strength to >/= 4+/5 to promote patellar stability to    Baseline see flowsheet    Time 6    Period Weeks    Status New    Target Date 07/03/20      PT LONG TERM GOAL #3   Title to be able to sit , stand and walk for for >/= 45 min to promote in home and community ambulatoin    Baseline sit 10-20, standing 10 min, walking 15-20    Time 6    Period Weeks    Status New    Target Date 07/03/20      PT LONG TERM GOAL #4   Title pt to be IND with all HEP and is able to maintain and progress current LOF IND    Baseline no previous HEP    Time 6    Period Weeks    Status New    Target Date 07/03/20                 Plan - 06/14/20 1214    Clinical Impression Statement Pt reports compliance with HEP and notes improved ability to reach her feet to tie her shoes. STG# 1 met.  Today she is able to perform an additional set of each therex. She was able to perform 7 reps of SLR on right, previously unable to perform 1 rep. She would benefit  from aquatic therapy and has been scheduled to begin on Jan26th.    Comorbidities hx of neuropathy, DM, gout, depression    PT Next Visit Plan review/ update HEP, gross hipo strengthening with empshasi on abductors, gait training, strengthening to stabliize patellorfemoral joint, modalitites PRN, pt is interested in aquatics-checking coverage    PT Home Exercise Plan [id number]- quad set with ball squeeze, LAQ, sit to stand, clamshells           Patient will benefit from skilled therapeutic intervention in order to improve the following deficits and impairments:  Obesity,Abnormal gait,Decreased balance,Decreased endurance,Decreased range of motion,Decreased strength,Improper body mechanics,Postural dysfunction,Increased muscle spasms,Pain  Visit Diagnosis: Chronic pain of left knee  Chronic pain of right knee  Muscle weakness (generalized)     Problem List Patient Active Problem List   Diagnosis Date Noted  . Left foot pain 04/26/2020  . Vaginal bleeding 12/07/2019  . Rectal bleeding 12/07/2019  . Left knee pain 12/19/2016  . Screening for diabetic retinopathy 10/30/2016  . Plantar wart 09/11/2016  . Special screening for malignant neoplasms, colon 01/22/2016  . BMI 70 and over, adult ([place]) 01/22/2016  . Insomnia 03/08/2013  . Hypertension 10/16/2009  . TOBACCO USE 12/01/2008  . LEG PAIN, BILATERAL 12/01/2008  . Diabetes ([place]) 07/11/2008  . Obesity 08/07/2006  . ASTHMA, INTERMITTENT 08/07/2006  . OSTEOARTHRITIS, MULTI SITES 08/07/2006    [name], PTA 06/14/2020, 1:04 PM  [place], [place], [address] Phone: [phone]   Fax:  [phone]  Name: Deborah Benson MRN: [id number] Date of Birth: 10-11-1960  PHYSICAL THERAPY DISCHARGE SUMMARY  Visits from Start of Care: 3  Current functional level related to goals / functional outcomes: See goals   Remaining  deficits: Current status unknown   Education / Equipment: HEP, theraband,   Plan: Patient agrees to discharge.  Patient goals were not met. Patient is being discharged due to not returning since the last visit.  ?????         Kristoffer Leamon PT, DPT, LAT, ATC  07/25/20  2:10 PM

## 2020-06-14 NOTE — Patient Instructions (Addendum)
Aquatic Therapy: What to Expect!  Where:  [facility]           NOTE: You will receive an automated phone message [address] [facility]           reminding you of your appointment and it will say the  [place], [place]  [address]           appointment is at the [address] [facility].  We are  [phone]             working to fix this - just know that you will meet [place] at                pool!   How to Prepare: . Please make sure you drink 8 ounces of water about one hour prior to your pool session . A caregiver may attend if needed with the patient to help assist as needed. A caregiver can sit in the bleachers next to the pool. . Please arrive IN YOUR SUIT and 15 minutes prior to your appointment - this helps to avoid delays in starting your session. . Please make sure to attend to any toileting needs prior to entering the pool . Locker rooms for changing are located to the right of the check -in desk.  There is direct access to the pool deck form the locker room.  You can lock your belongings in a locker but you must bring you own lock. . Once on the pool deck your therapist will ask you to sign the Patient  Consent and Assignment of Benefits form . Your therapist may take your blood pressure prior to, during and after your session if indicated . We usually try and create a home exercise program based on activities we do in the pool.  Please be thinking about who might be able to assist you in the pool should you need to participate in an aquatic home exercise program at the time of discharge if you need assistance.  Some patients do not want to or do not have the ability to participate in an aquatic home program - this is not a barrier in any way to you participating in aquatic therapy as part of your current therapy plan! . After Discharge from PT, you can continue using home program at  the [facility], there is a drop-in fee for $5 ($45 a month)or for 60 years   or older $4.00 ($40 a month for seniors )    About the pool: 1. Entering the pool Your therapist will assist you; there are multiple ways to enter including stairs with railings, a walk in ramp, a roll in chair and a mechanical lift. Your therapist will determine the most appropriate way for you. 2. Water temperature is usually between 86-87 degrees 3. There may be other swimmers in the pool at the same time 4. There is availability at the pool for     Contact Info:             Appointments: Please call the [facility] if you need to cancel or reschedule an appointment. [address]             [phone]   [place], [place]            All sessions are 45 minutes      [phone]  Access Code: [email or web address] URL: [email or web address] Date: 06/14/2020 Prepared by: Hessie Diener  Exercises Supine Straight Leg Hip Adduction and Quad Set with Diona Foley - 1 x daily - 7 x weekly - 10 reps - 2 sets - 5 seconds hold Hooklying Clamshell with Resistance - 1 x daily - 7 x weekly - 2 sets - 10 reps Sit to Stand without Arm Support - 1 x daily - 7 x weekly - 2 sets - 10 reps Seated Long Arc Quad - 1 x daily - 7 x weekly - 2 sets - 10 reps Supine Hamstring Stretch with Strap - 1 x daily - 7 x weekly - 2 reps - 2 sets - 30 hold Sidelying Hip Abduction - 1 x daily - 7 x weekly - 2 sets - 10 reps Clam - 1 x daily - 7 x weekly - 2 sets - 10 reps

## 2020-06-19 ENCOUNTER — Ambulatory Visit: Payer: Medicaid Other | Admitting: Physical Therapy

## 2020-06-20 ENCOUNTER — Ambulatory Visit: Payer: Medicaid Other | Admitting: Family Medicine

## 2020-06-20 NOTE — Progress Notes (Deleted)
    SUBJECTIVE:   CHIEF COMPLAINT / HPI:   DIABETES Disease Monitoring: Blood Sugar range-not checking   Medications Adherence off all medications for at least a year          Hypoglycemic symptoms- no Metformin XR 500 mg twice daily sent to pharmacy.  She was encouraged to try this medicine because often has a better side effect profile. -Start semaglutide 0.25 mg weekly injections.  Increase by 0.25 mg weekly until it dose of 1.0 mg weekly is reached.   PELVIC BLEEDING For last year has had episodes of bleeding.  Unsure if rectal or vaginal.  Episodic will have for a few days then none for a week then returns.  No large amounts.  LMP - 10 years ago.  No menstrual period like feelings.  Does have blood on toilet paper but not in stool or bowel.  Unsure if bleeding for last year is vaginal or rectal and body habitus prevents good exam.  Could be hemmrhoids or possible colon or uterine neoplasia.   Will refer for colonoscopy and check pelvic US  PERTINENT  PMH / [place]: ***  OBJECTIVE:   There were no vitals taken for this visit.  ***  ASSESSMENT/PLAN:   No problem-specific Assessment & Plan notes found for this encounter.     Lind Covert, MD [place]

## 2020-06-21 ENCOUNTER — Ambulatory Visit: Payer: Medicaid Other | Admitting: Physical Therapy

## 2020-06-28 ENCOUNTER — Ambulatory Visit: Payer: Medicaid Other | Admitting: Physical Therapy

## 2020-06-30 ENCOUNTER — Telehealth: Payer: Self-pay | Admitting: Physical Therapy

## 2020-06-30 ENCOUNTER — Ambulatory Visit: Payer: Medicaid Other | Admitting: Physical Therapy

## 2020-06-30 NOTE — Telephone Encounter (Signed)
Attempted to contact patient regarding no show to appointment and recent cancellations due to COVID-19 exposure. Left next appointment date/ time and asked her to call if she will be unable to attend.

## 2020-07-03 ENCOUNTER — Ambulatory Visit: Payer: Medicaid Other | Admitting: Physical Therapy

## 2020-07-05 ENCOUNTER — Ambulatory Visit: Payer: Medicaid Other | Admitting: Physical Therapy

## 2020-07-10 ENCOUNTER — Ambulatory Visit: Payer: Medicaid Other | Admitting: Physical Therapy

## 2020-07-12 ENCOUNTER — Ambulatory Visit: Payer: Medicaid Other | Admitting: Physical Therapy

## 2020-07-17 ENCOUNTER — Encounter: Payer: Self-pay | Admitting: Physical Therapy

## 2020-07-19 ENCOUNTER — Ambulatory Visit: Payer: Medicaid Other | Admitting: Physical Therapy

## 2020-09-05 ENCOUNTER — Other Ambulatory Visit: Payer: Self-pay

## 2020-09-05 ENCOUNTER — Encounter: Payer: Self-pay | Admitting: Family Medicine

## 2020-09-05 ENCOUNTER — Ambulatory Visit: Payer: Medicaid Other | Admitting: Family Medicine

## 2020-09-05 VITALS — BP 165/74 | HR 78 | Wt >= 6400 oz

## 2020-09-05 DIAGNOSIS — N939 Abnormal uterine and vaginal bleeding, unspecified: Secondary | ICD-10-CM

## 2020-09-05 DIAGNOSIS — Z13220 Encounter for screening for lipoid disorders: Secondary | ICD-10-CM

## 2020-09-05 DIAGNOSIS — F17200 Nicotine dependence, unspecified, uncomplicated: Secondary | ICD-10-CM | POA: Diagnosis not present

## 2020-09-05 DIAGNOSIS — Z6841 Body Mass Index (BMI) 40.0 and over, adult: Secondary | ICD-10-CM

## 2020-09-05 DIAGNOSIS — E119 Type 2 diabetes mellitus without complications: Secondary | ICD-10-CM | POA: Diagnosis present

## 2020-09-05 DIAGNOSIS — R748 Abnormal levels of other serum enzymes: Secondary | ICD-10-CM | POA: Diagnosis not present

## 2020-09-05 DIAGNOSIS — I10 Essential (primary) hypertension: Secondary | ICD-10-CM

## 2020-09-05 LAB — POCT GLYCOSYLATED HEMOGLOBIN (HGB A1C): HbA1c, POC (controlled diabetic range): 7.9 % — AB (ref 0.0–7.0)

## 2020-09-05 NOTE — Assessment & Plan Note (Signed)
Severe.  Discussed approach with diabetes medication to help w weight loss and diet.  If not effective may need to investigate surgery.

## 2020-09-05 NOTE — Progress Notes (Signed)
    SUBJECTIVE:   CHIEF COMPLAINT / HPI:   Diabetes Had a thorough discussion about her commitment to standard diabetes treatment and how that requires frequent visits especially until controlled and adherence other wise she is at high risk for complications.  "we need to be in the same boat paddling the same way"  She is currently not taking any medications.  Had stopped trulicity a month ago due to back pain.  Thinks maybe metformin caused some vision changes   BP She feels is elevated today because has been eating a lot of bacon  Mood She is feeling tired and unmotivated.  Does not want any treatment currently.  No suicidal ideation    PERTINENT  PMH / PSH: long standing diet controlled diabetes   OBJECTIVE:   BP (!) 165/74   Pulse 78   Wt (!) 431 lb 12.8 oz (195.9 kg)   SpO2 95%   BMI 76.49 kg/m   Psych:  Cognition and judgment appear intact. Alert, communicative  and cooperative with normal attention span and concentration. No apparent delusions, illusions, hallucinations   ASSESSMENT/PLAN:   Diabetes Poorly controlled due to adherence issues.  Had discussion and agreement that she will follow up as scheduled and be adherent to medications.  Have her see pharmacy in two weeks and me back in one month.  See after visit summary for medication plan  BMI 70 and over, adult ([place]) Severe.  Discussed approach with diabetes medication to help w weight loss and diet.  If not effective may need to investigate surgery.     Vaginal bleeding Need to discuss further next visit   TOBACCO USE She is thinking about cutting down      Lind Covert, [place]

## 2020-09-05 NOTE — Assessment & Plan Note (Signed)
She is thinking about cutting down

## 2020-09-05 NOTE — Assessment & Plan Note (Signed)
Poorly controlled due to adherence issues.  Had discussion and agreement that she will follow up as scheduled and be adherent to medications.  Have her see pharmacy in two weeks and me back in one month.  See after visit summary for medication plan

## 2020-09-05 NOTE — Assessment & Plan Note (Signed)
Need to discuss further next visit

## 2020-09-05 NOTE — Patient Instructions (Addendum)
Good to see you today - Thank you for coming in  Things we discussed today:  Diabetes - restart metformin one at bed time.  After 4-5 days take it twice a day - Make an appointment with Dr Valentina Lucks for 2 weeks to discuss restarting Trulicity  May need to start a blood pressure medication and a cholesterol medication  Keep working on stopping smoking   We need to address sleep and bleeding next visits  I will call you if your lab tests are not normal.  Otherwise we will discuss them at your next visit.  Please always bring your medication bottles  Come back to see me in one month

## 2020-09-06 DIAGNOSIS — R748 Abnormal levels of other serum enzymes: Secondary | ICD-10-CM | POA: Insufficient documentation

## 2020-09-06 LAB — LIPID PANEL
Chol/HDL Ratio: 3.6 ratio (ref 0.0–4.4)
Cholesterol, Total: 174 mg/dL (ref 100–199)
HDL: 48 mg/dL (ref >39)
LDL Chol Calc (NIH): 103 mg/dL — ABNORMAL HIGH (ref 0–99)
Triglycerides: 129 mg/dL (ref 0–149)
VLDL Cholesterol Cal: 23 mg/dL (ref 5–40)

## 2020-09-06 LAB — CMP14+EGFR
ALT: 15 [IU]/L (ref 0–32)
AST: 15 [IU]/L (ref 0–40)
Albumin/Globulin Ratio: 0.9 — ABNORMAL LOW (ref 1.2–2.2)
Albumin: 3.9 g/dL (ref 3.8–4.9)
Alkaline Phosphatase: 124 [IU]/L — ABNORMAL HIGH (ref 44–121)
BUN/Creatinine Ratio: 10 (ref 9–23)
BUN: 7 mg/dL (ref 6–24)
Bilirubin Total: 0.3 mg/dL (ref 0.0–1.2)
CO2: 23 mmol/L (ref 20–29)
Calcium: 9.2 mg/dL (ref 8.7–10.2)
Chloride: 98 mmol/L (ref 96–106)
Creatinine, Ser: 0.72 mg/dL (ref 0.57–1.00)
Globulin, Total: 4.2 g/dL (ref 1.5–4.5)
Glucose: 169 mg/dL — ABNORMAL HIGH (ref 65–99)
Potassium: 4.4 mmol/L (ref 3.5–5.2)
Sodium: 140 mmol/L (ref 134–144)
Total Protein: 8.1 g/dL (ref 6.0–8.5)
eGFR: 97 mL/min/{1.73_m2}

## 2020-09-06 NOTE — Assessment & Plan Note (Signed)
Mild.  Will recheck in a few months

## 2020-09-21 ENCOUNTER — Ambulatory Visit: Payer: Medicaid Other | Admitting: Pharmacist

## 2020-10-05 ENCOUNTER — Other Ambulatory Visit: Payer: Self-pay

## 2020-10-05 MED ORDER — TRULICITY 0.75 MG/0.5ML ~~LOC~~ SOAJ: 0.7500 mg | SUBCUTANEOUS | 1 refills | Status: DC

## 2020-10-10 ENCOUNTER — Encounter: Payer: Self-pay | Admitting: Pharmacist

## 2020-10-10 ENCOUNTER — Ambulatory Visit: Payer: Medicaid Other | Admitting: Pharmacist

## 2020-10-10 ENCOUNTER — Other Ambulatory Visit: Payer: Self-pay

## 2020-10-10 DIAGNOSIS — F17200 Nicotine dependence, unspecified, uncomplicated: Secondary | ICD-10-CM

## 2020-10-10 DIAGNOSIS — E119 Type 2 diabetes mellitus without complications: Secondary | ICD-10-CM

## 2020-10-10 MED ORDER — GABAPENTIN (ONCE-DAILY) 600 MG PO TABS: 1.0000 | ORAL_TABLET | Freq: Every day | ORAL | 1 refills | Status: DC

## 2020-10-10 MED ORDER — TRULICITY 1.5 MG/0.5ML ~~LOC~~ SOAJ: 1.5000 mg | SUBCUTANEOUS | 5 refills | Status: DC

## 2020-10-10 MED ORDER — METFORMIN HCL ER 500 MG PO TB24: 500.0000 mg | ORAL_TABLET | Freq: Every day | ORAL | Status: DC

## 2020-10-10 NOTE — Patient Instructions (Signed)
Nice to meet you today. \  Refills for Gabapentin and Trulicity sent to your pharmacy.   The Trulicity in the next higher dose.  1.5mg  weekly.   Cigarettes reduce to 5 per day.    Please plan to come back to see Dr. Erin Hearing in 1 month.

## 2020-10-10 NOTE — Progress Notes (Signed)
Reviewed: I agree with Dr. Koval's documentation and management. 

## 2020-10-10 NOTE — Progress Notes (Signed)
S:     Chief Complaint  Patient presents with  . Medication Management    Trulicity - Med restart    Patient arrives in good spirits, ambulating without assistance.  Presents for diabetes evaluation, education, and management - Trulicity restart.  Patient was referred and last seen by Primary Care Provider, Dr. Erin Hearing on 09/05/2020.Marland Kitchen    Patient reports Diabetes was diagnosed > 10 years ago (2010).   Insurance coverage/medication affordability: Medicaid  Medication adherence reported good when she has medication.  Comes to visit to get new refill authorization for Trulicity.   Current diabetes medications include: Trulicity 3.22 mg once weekly and metformin 500mg  XR once daily.     Patient reported dietary habits: Eats multiple meals/day Breakfast toast and coffee Lunch: sandwich , chips, burger, fries Dinner:vaious Snacks: chips Drinks Gatorade, water, coffee  Patient-reported exercise habits: limited (considering chair workouts due to knee pain).     O:  Physical Exam Constitutional:      Appearance: Normal appearance. She is obese.  Pulmonary:     Effort: Pulmonary effort is normal.  Neurological:     Mental Status: She is alert.  Psychiatric:        Mood and Affect: Mood normal.        Thought Content: Thought content normal.      Review of Systems  All other systems reviewed and are negative.    Lab Results  Component Value Date   HGBA1C 7.9 (A) 09/05/2020   Vitals:   10/10/20 0948  Pulse: 78  SpO2: 96%    Lipid Panel     Component Value Date/Time   CHOL 174 09/05/2020 1159   TRIG 129 09/05/2020 1159   HDL 48 09/05/2020 1159   CHOLHDL 3.6 09/05/2020 1159   CHOLHDL 3.3 08/11/2013 1051   VLDL 23 08/11/2013 1051   LDLCALC 103 (H) 09/05/2020 1159    Home fasting blood sugars:  Not reported.    Clinical Atherosclerotic Cardiovascular Disease (ASCVD): No  The 10-year ASCVD risk score Mikey Bussing DC Jr., et al., 2013) is: 36.3%   Values  used to calculate the score:     Age: 60 years     Sex: Female     Is Non-Hispanic African American: Yes     Diabetic: Yes     Tobacco smoker: Yes     Systolic Blood Pressure: 025 mmHg     Is BP treated: No     HDL Cholesterol: 48 mg/dL     Total Cholesterol: 174 mg/dL    A/P: Diabetes longstanding (> 10 years) currently with fair control but interested in use of GLPs for both glycemic control and weight loss.  Medication adherence appears good. Control is suboptimal due to dietary indiscretion and sedentary lifestyle. -Restarted GLP-1 Trulicity (dulaglutide) - increase dose from 0.75mg  to 1.5mg  weekly. .  -Extensively discussed pathophysiology of diabetes, recommended lifestyle interventions, dietary effects on blood sugar control.  Discussed decreased calorie intake in drinks, carbohydrates (bread) and "chips".   Increase green vegetables.   ASCVD risk - primary prevention  Last LDL > 100 At next visit PCP  Consider-Initiated a trial of statin tx.   Longstanding history of smoking since age 87.  Currently smoking ~ 7 cigs per day. Not interested in quitting at this time.   - Agreed to cut down to 5 per day - Offered to help in the future if/when she is interested in quitting.    Written patient instructions provided.  Total time  in face to face counseling 30 minutes.   Follow up PCP  Clinic Visit in 1 month.   Patient seen with Marlowe Alt PharmD Candidate, and Norina Buzzard, PharmD - PGY-1 Resident.

## 2020-10-10 NOTE — Assessment & Plan Note (Signed)
Diabetes longstanding (> 10 years) currently with fair control but interested in use of GLPs for both glycemic control and weight loss.  Medication adherence appears good. Control is suboptimal due to dietary indiscretion and sedentary lifestyle. -Restarted GLP-1 Trulicity (dulaglutide) - increase dose from 0.75mg  to 1.5mg  weekly. .  -Extensively discussed pathophysiology of diabetes, recommended lifestyle interventions, dietary effects on blood sugar control.  Discussed decreased calorie intake in drinks, carbohydrates (bread) and "chips".   Increase green vegetables.

## 2020-10-10 NOTE — Assessment & Plan Note (Signed)
Longstanding history of smoking since age 60.  Currently smoking ~ 7 cigs per day. Not interested in quitting at this time.   - Agreed to cut down to 5 per day - Offered to help in the future if/when she is interested in quitting.

## 2020-10-11 ENCOUNTER — Telehealth: Payer: Self-pay

## 2020-10-11 NOTE — Telephone Encounter (Signed)
Medication has been approved and the pharmacy has been updated.

## 2020-10-11 NOTE — Telephone Encounter (Signed)
Received fax from pharmacy, PA needed on Gralise 600mg . Clinical questions submitted via Cover My Meds. Waiting on response, could take up to 72 hours.  Cover My Meds info: Key: [id number]

## 2020-10-17 ENCOUNTER — Ambulatory Visit: Payer: Medicaid Other | Admitting: Family Medicine

## 2020-11-21 ENCOUNTER — Ambulatory Visit: Payer: Medicaid Other | Admitting: Family Medicine

## 2020-11-21 NOTE — Progress Notes (Deleted)
    SUBJECTIVE:   CHIEF COMPLAINT / HPI:   Vaginal Rectal Bleeding   Diabetes longstanding (> 10 years) currently with fair control but interested in use of GLPs for both glycemic control and weight loss.  Medication adherence appears good. Control is suboptimal due to dietary indiscretion and sedentary lifestyle. -Restarted GLP-1 Trulicity (dulaglutide) - increase dose from 0.75mg  to 1.5mg  weekly. . -Extensively discussed pathophysiology of diabetes, recommended lifestyle interventions, dietary effects on blood sugar control.  Discussed decreased calorie intake in drinks, carbohydrates (bread) and "chips".   Increase green vegetables.   ASCVD risk - primary prevention Last LDL > 100 At next visit PCP  Consider-Initiated a trial of statin tx.   Longstanding history of smoking since age 37.  Currently smoking ~ 7 cigs per day. Not interested in quitting at this time.   - Agreed to cut down to 5 per day - Offered to help in the future if/when she is interested in quitting.   PERTINENT  PMH / PSH:   OBJECTIVE:   There were no vitals taken for this visit.  ***  ASSESSMENT/PLAN:   No problem-specific Assessment & Plan notes found for this encounter.     Lind Covert, MD [place]   {    This will disappear when note is signed, click to select method of visit    :1}

## 2021-03-08 ENCOUNTER — Other Ambulatory Visit: Payer: Self-pay | Admitting: *Deleted

## 2021-03-08 DIAGNOSIS — E119 Type 2 diabetes mellitus without complications: Secondary | ICD-10-CM

## 2021-03-09 MED ORDER — ALBUTEROL SULFATE HFA 108 (90 BASE) MCG/ACT IN AERS: INHALATION_SPRAY | RESPIRATORY_TRACT | 0 refills | Status: DC

## 2021-04-10 ENCOUNTER — Other Ambulatory Visit: Payer: Self-pay | Admitting: Family Medicine

## 2021-05-11 ENCOUNTER — Other Ambulatory Visit: Payer: Self-pay | Admitting: Family Medicine

## 2021-05-11 DIAGNOSIS — E119 Type 2 diabetes mellitus without complications: Secondary | ICD-10-CM

## 2021-05-28 NOTE — Progress Notes (Incomplete)
° ° °  SUBJECTIVE:   CHIEF COMPLAINT / HPI:   Diabetes Poorly controlled due to adherence issues.  Had discussion and agreement that she will follow up as scheduled and be adherent to medications.  Have her see pharmacy in two weeks and me back in one month.  See after visit summary for medication plan   BMI 70 and over, adult ([place]) Severe.  Discussed approach with diabetes medication to help w weight loss and diet.  If not effective may need to investigate surgery.      Vaginal bleeding Need to discuss further next visit    TOBACCO USE She is thinking about cutting down     PERTINENT  PMH / PSH: ***  OBJECTIVE:   There were no vitals taken for this visit.  ***  ASSESSMENT/PLAN:   No problem-specific Assessment & Plan notes found for this encounter.     Lind Covert, MD [place]

## 2021-05-29 ENCOUNTER — Ambulatory Visit: Payer: Medicaid Other | Admitting: Family Medicine

## 2021-05-29 NOTE — Patient Instructions (Incomplete)
Good to see you today - Thank you for coming in  Things we discussed today:   You need a mammogram to prevent breast cancer.  Please schedule an appointment.  You can call [phone].     You need a colonoscopy to prevent colon cancer.  I have placed a referral to the gastroenterologist's office.  They should call you within two weeks.  If they do not please let me know    Please always bring your medication bottles  Come back to see me in ***

## 2021-06-08 ENCOUNTER — Other Ambulatory Visit: Payer: Self-pay | Admitting: Family Medicine

## 2021-06-12 ENCOUNTER — Encounter: Payer: Self-pay | Admitting: Family Medicine

## 2021-06-12 ENCOUNTER — Ambulatory Visit: Payer: Medicaid Other | Admitting: Family Medicine

## 2021-06-12 ENCOUNTER — Other Ambulatory Visit: Payer: Self-pay

## 2021-06-12 VITALS — BP 164/62 | HR 68 | Wt >= 6400 oz

## 2021-06-12 DIAGNOSIS — F17200 Nicotine dependence, unspecified, uncomplicated: Secondary | ICD-10-CM | POA: Diagnosis not present

## 2021-06-12 DIAGNOSIS — E119 Type 2 diabetes mellitus without complications: Secondary | ICD-10-CM | POA: Diagnosis not present

## 2021-06-12 DIAGNOSIS — R051 Acute cough: Secondary | ICD-10-CM

## 2021-06-12 DIAGNOSIS — R059 Cough, unspecified: Secondary | ICD-10-CM | POA: Insufficient documentation

## 2021-06-12 DIAGNOSIS — K625 Hemorrhage of anus and rectum: Secondary | ICD-10-CM

## 2021-06-12 LAB — POCT GLYCOSYLATED HEMOGLOBIN (HGB A1C): HbA1c, POC (controlled diabetic range): 7.2 % — AB (ref 0.0–7.0)

## 2021-06-12 MED ORDER — METFORMIN HCL ER 500 MG PO TB24: 500.0000 mg | ORAL_TABLET | Freq: Two times a day (BID) | ORAL | 1 refills | Status: DC

## 2021-06-12 MED ORDER — GABAPENTIN 300 MG PO CAPS: 300.0000 mg | ORAL_CAPSULE | Freq: Three times a day (TID) | ORAL | 3 refills | Status: DC

## 2021-06-12 MED ORDER — TRULICITY 3 MG/0.5ML ~~LOC~~ SOAJ: 3.0000 mg | SUBCUTANEOUS | 4 refills | Status: DC

## 2021-06-12 NOTE — Progress Notes (Signed)
° ° °  SUBJECTIVE:   CHIEF COMPLAINT / HPI:   Last seen March 2022 by me and Dr Deborah Benson in May   Diabetes She is taking metformin and trulicity 1.5 regularly.  Feels is helping with appetite.  No side effects   Back pain R sided goes down to back of knee.  No weakness or incontinence.  No injury.  Has had for two weeks.  Takes gabapentin intermittently  Cough Had virus about a month ago.  Continues to have dry cough.  No fever or chills or sputum or shortness of breath .  Albuterol inhaler helps some   Tobacco - down to 4 cigs per day.  Plans to stop   OBJECTIVE:   BP (!) 164/62    Pulse 68    Wt (!) 406 lb (184.2 kg)    SpO2 100%    BMI 71.92 kg/m   Back - mildy tender over R flank.  No spinous tenderness.  No CVAT Able to stand on toes and heels Mild SLR on R  Lungs - loud cough,  Mild wheeze.  No crackles or dullness   ASSESSMENT/PLAN:   TOBACCO USE Improved.  Continue weaning   Diabetes Stble.  Increase trulicity for better control and weight management  Rectal bleeding Encouraged her to call her gastroenterologist to follow up recommendation to get in hospital colonoscopy   Cough Likely post viral bronchitis.  Encourage smoking cessation and monitor for resolution.  Use albuterol as needed    HM See after visit summary    Deborah Benson, Deborah Benson

## 2021-06-12 NOTE — Patient Instructions (Addendum)
Good to see you today - Thank you for coming in  Things we discussed today:  Diabetes Doing well Increase trulcitiy to 3 mg weekly   Cough Should be improving over the next 2- 3 months If any worsening or bleeding or shortness of breath let me know  Leg Pain - sciatica or a slipped disc - Take tylenol and advil as needed and gabapentin three times a day can titrate as needed  - if any weakness or incontinence then call me  Smoking Keep going    You need a mammogram to prevent breast cancer.  Please schedule an appointment.  You can call [phone].     You need an diabetes eye exam every year.  Please see your eye doctor.  Ask them to fax [place] a report of your exam   Call about your colonoscopy [facility] - if has to be done in hospital    Please always bring your medication bottles  Come back to see me in March 2023

## 2021-06-12 NOTE — Assessment & Plan Note (Signed)
Stble.  Increase trulicity for better control and weight management

## 2021-06-12 NOTE — Assessment & Plan Note (Signed)
Improved.  Continue weaning

## 2021-06-12 NOTE — Assessment & Plan Note (Signed)
Likely post viral bronchitis.  Encourage smoking cessation and monitor for resolution.  Use albuterol as needed

## 2021-06-12 NOTE — Assessment & Plan Note (Signed)
Encouraged her to call her gastroenterologist to follow up recommendation to get in hospital colonoscopy

## 2021-07-02 ENCOUNTER — Telehealth: Payer: Self-pay

## 2021-07-02 NOTE — Telephone Encounter (Signed)
Patient LVM on nurse line requesting returned phone call to discuss breathing issues post viral URI.  Returned phone call to patient. Patient states that "I caught a cold a few days after seeing Dr. Erin Hearing." Patient states that she has been wheezing and having SHOB on exertion for the last two weeks. Denies chest pain. Reports that inhalers help temporarily but symptoms return. Patient is able to speak in complete sentences at this time and is not currently short of breath.   Scheduled for tomorrow at 2;30 for further evaluation. Strict ED precautions given.   Talbot Grumbling, RN

## 2021-07-03 ENCOUNTER — Ambulatory Visit (INDEPENDENT_AMBULATORY_CARE_PROVIDER_SITE_OTHER): Payer: Medicaid Other | Admitting: Family Medicine

## 2021-07-03 ENCOUNTER — Other Ambulatory Visit: Payer: Self-pay

## 2021-07-03 VITALS — BP 147/85 | HR 76 | Ht 63.0 in | Wt >= 6400 oz

## 2021-07-03 DIAGNOSIS — R051 Acute cough: Secondary | ICD-10-CM | POA: Diagnosis not present

## 2021-07-03 DIAGNOSIS — E119 Type 2 diabetes mellitus without complications: Secondary | ICD-10-CM | POA: Diagnosis present

## 2021-07-03 MED ORDER — PROAIR HFA 108 (90 BASE) MCG/ACT IN AERS: INHALATION_SPRAY | RESPIRATORY_TRACT | 0 refills | Status: DC

## 2021-07-03 NOTE — Addendum Note (Signed)
Addended by: Lurline Del on: 07/03/2021 03:31 PM   Modules accepted: Orders

## 2021-07-03 NOTE — Patient Instructions (Signed)
We are doing testing for flu/RSV/COVID-19 today.  I also would like for her to go across the street to [facility] and get a chest x-ray.  You can walk in the front door at the cul-de-sac and they will direct you where to go.  Additionally on checking some blood work today including your A1c to see where your diabetes is at.  I will call you tomorrow with the results and we will discuss neck steps once we have all the information we need.  In the meantime if you develop any worsening shortness of breath, chest pain, or any other concerning symptoms on should go to the emergency department.

## 2021-07-03 NOTE — Progress Notes (Signed)
° ° °  SUBJECTIVE:   CHIEF COMPLAINT / HPI:   Wheezing/shortness of breath: 61 year old female with history of intermittent asthma and type 2 diabetes presenting with the above.  She states this has been going on for at least 2 weeks after her granddaughter came home with a cold. She starts it did start to get better and then the cough and shortness of breath started about a week ago. No new medications.  Smoking history - 4-5 cigarettes per day. No fevers, she has had productive cough. She is having diarrhea about 3x per day for the past few days. She denies a history of heart failure or COPD. She took a covid test a week ago that was negative. She is using the albuterol about every 2-4 hrs  PERTINENT  PMH / PSH: Asthma  OBJECTIVE:   BP (!) 147/85    Pulse 76    Ht 5\' 3"  (1.6 m)    Wt (!) 418 lb 8 oz (189.8 kg)    SpO2 96%    BMI 74.13 kg/m   General: NAD, pleasant, able to participate in exam HEENT: No pharyngeal erythema Cardiac: RRR, no murmurs. Respiratory: Significant wheezing present on exam.  No set areas of poor air movement or crackles however the level of wheezing limits my ability to auscultate underlying crackles.  She is in no respiratory distress on room air. Psych: Normal affect and mood  ASSESSMENT/PLAN:   Cough, congestion, shortness of breath: 61 year old female presenting with the above.  This is been going on for about 2 weeks.  Initially improved and then started to worsen.  It all started after coming in contact with her granddaughter who "had a cold".  On physical exam she has diffuse wheezing throughout her lungs.  She has no pitting edema of her lower extremities I am not able to auscultate any crackles on her pulmonary exam.  Her vitals are stable and within expected limits.  She denies chest pain or fevers.  Overall differential can include a viral infection versus COPD/asthma exacerbation versus CHF.  She does not have a history of CHF and does not have a previous  echo in her chart.  She does not have a diagnosed history of COPD but she is a current smoker.  She does have a history of mild intermittent asthma for which she uses albuterol.  We will start the work-up with COVID/influenza/RSV testing, will get a chest x-ray, will get a BNP to evaluate for any signs of heart failure.  Discussed with the patient that I will reach out to her tomorrow once the chest x-ray results to discuss neck steps.  I did discuss return/ED precautions.  Lurline Del, [place]

## 2021-07-04 LAB — COVID-19, FLU A+B AND RSV
Influenza A, NAA: NOT DETECTED
Influenza B, NAA: NOT DETECTED
RSV, NAA: NOT DETECTED
SARS-CoV-2, NAA: NOT DETECTED

## 2021-07-05 ENCOUNTER — Telehealth: Payer: Self-pay

## 2021-07-05 NOTE — Telephone Encounter (Signed)
Patient calls nurse line regarding results of flu and COVID testing. Informed of negative results.   Patient states that she did not get blood work done at this visit and is asking if she can come in on Monday to have this drawn.   Please advise if future order can be placed for BNP. Order from 1/24 states it was collected, however, patient states that she did not have blood drawn at this visit.   Will forward to Dr. Vanessa [place] and Herbie Baltimore for further clarification.    Talbot Grumbling, RN

## 2021-07-06 ENCOUNTER — Other Ambulatory Visit: Payer: Self-pay | Admitting: Family Medicine

## 2021-07-06 DIAGNOSIS — R051 Acute cough: Secondary | ICD-10-CM

## 2021-07-09 NOTE — Telephone Encounter (Signed)
Called patient and scheduled for lab visit on Thursday, 07/12/21.  Talbot Grumbling, RN

## 2021-07-12 ENCOUNTER — Telehealth: Payer: Self-pay

## 2021-07-12 ENCOUNTER — Other Ambulatory Visit: Payer: Medicaid Other

## 2021-07-12 NOTE — Telephone Encounter (Signed)
Patient calls nurse line reporting continued cough along with cold symptoms. Patient reports she woke up this morning very hoarse and producing "bright yellow" phlegm. Patient denies fever/chills or sore throat. Patient does report wheezing and SOB when she tries to lay down. Patient reports she has been using her inhaler with relief. Patient reports she doesn't feel well enough to come in for lab work scheduled for today.   Patient is asking for something to help with her symptoms.   Conservative measures given and red flags discussed with patient.   Will forward to provider who saw patient.

## 2021-07-13 ENCOUNTER — Other Ambulatory Visit: Payer: Self-pay | Admitting: Family Medicine

## 2021-07-13 DIAGNOSIS — E119 Type 2 diabetes mellitus without complications: Secondary | ICD-10-CM

## 2021-07-13 MED ORDER — PROAIR HFA 108 (90 BASE) MCG/ACT IN AERS: INHALATION_SPRAY | RESPIRATORY_TRACT | 0 refills | Status: DC

## 2021-07-13 MED ORDER — BENZONATATE 100 MG PO CAPS: 100.0000 mg | ORAL_CAPSULE | Freq: Two times a day (BID) | ORAL | 0 refills | Status: DC | PRN

## 2021-07-13 NOTE — Progress Notes (Signed)
Called patient in response to her phone call to our nursing staff yesterday.  She states that she does feel a bit better but still has an annoying cough.  She denies any shortness of breath or chest pains.  She did request medication to help her with her cough.  I will send in [facility].  She also requested a refill for her albuterol inhaler just in case and so I have sent this as well.  Discussed return precautions.  I do not believe she needs the BNP that we had ordered for lab work to rule out heart failure as this seems consistent with a viral infection.  Patient is in agreement with this.  She is going to follow-up with [place] on Monday if she does not continue to feel better for further evaluation.  I did discuss ED precautions in case she worsens over the weekend.

## 2021-07-16 ENCOUNTER — Telehealth: Payer: Self-pay

## 2021-07-16 NOTE — Telephone Encounter (Signed)
Patient returns call to nurse line. Patient reports that tessalon perles "are drying me out too much."  Patient reports that over the weekend she tried an albuterol nebulizer treatment that a family member was able to provide. Patient reports that she had significant improvement in symptoms after taking treatment.   Patient is requesting that PCP prescribe albuterol and nebulizer machine. Please advise if DME order can be placed.   Talbot Grumbling, RN

## 2021-07-17 ENCOUNTER — Ambulatory Visit (HOSPITAL_COMMUNITY)
Admission: RE | Admit: 2021-07-17 | Discharge: 2021-07-17 | Disposition: A | Discharge status: Home / Self Care | Payer: Medicaid Other | Source: Ambulatory Visit | Attending: Family Medicine | Admitting: Family Medicine

## 2021-07-17 ENCOUNTER — Other Ambulatory Visit: Payer: Self-pay

## 2021-07-17 DIAGNOSIS — R051 Acute cough: Secondary | ICD-10-CM | POA: Insufficient documentation

## 2021-07-17 NOTE — Telephone Encounter (Signed)
Attempted to call patient and there was no answer.  Mailbox is full so no voicemail could be left.    Will try again later.  Ozella Almond, [place]

## 2021-07-17 NOTE — Telephone Encounter (Signed)
Patient returns call to nurse line. Patient advised to go to [facility] to complete chest xray.   Patient agreed with plan.

## 2021-07-17 NOTE — Telephone Encounter (Signed)
Please let her know - She has a cxr ordered.  She should get this and then come in for an office visit before we can get approval for a nebulizer  Thanks  LC

## 2021-07-19 ENCOUNTER — Other Ambulatory Visit: Payer: Self-pay | Admitting: Family Medicine

## 2021-07-19 MED ORDER — AZITHROMYCIN 250 MG PO TABS: ORAL_TABLET | ORAL | 0 refills | Status: DC

## 2021-07-19 NOTE — Progress Notes (Signed)
Called patient with regard to her xray findings suggestive of possible infiltrate vs atelectasis. Patient states she continues to have the cough and feel unwell. She doesn't think she has worsened or improved. Discussed with her I think it would be reasonable to treat with an antibiotic. Will call in azithromycin 500mg  today followed by 4 days of 250mg . Discussed return precautions.

## 2021-07-24 NOTE — Telephone Encounter (Signed)
Patient calls nurse line regarding follow up for pneumonia. Patient has just finished azithromycin course and is requesting to be seen for follow up. Patient reports that while cough has improved, she continues to have [facility] with exertion and slight hoarseness.   Scheduled for follow up tomorrow. ED precautions given.   Talbot Grumbling, RN

## 2021-07-25 ENCOUNTER — Other Ambulatory Visit: Payer: Self-pay

## 2021-07-25 ENCOUNTER — Ambulatory Visit: Payer: Medicaid Other | Admitting: Family Medicine

## 2021-07-25 ENCOUNTER — Encounter: Payer: Self-pay | Admitting: Family Medicine

## 2021-07-25 DIAGNOSIS — J4541 Moderate persistent asthma with (acute) exacerbation: Secondary | ICD-10-CM

## 2021-07-25 DIAGNOSIS — J189 Pneumonia, unspecified organism: Secondary | ICD-10-CM | POA: Diagnosis not present

## 2021-07-25 MED ORDER — BUDESONIDE-FORMOTEROL FUMARATE 80-4.5 MCG/ACT IN AERO: 2.0000 | INHALATION_SPRAY | Freq: Two times a day (BID) | RESPIRATORY_TRACT | 3 refills | Status: DC

## 2021-07-25 MED ORDER — ALBUTEROL SULFATE (2.5 MG/3ML) 0.083% IN NEBU: 2.5000 mg | INHALATION_SOLUTION | Freq: Four times a day (QID) | RESPIRATORY_TRACT | 1 refills | Status: AC | PRN

## 2021-07-25 NOTE — Patient Instructions (Addendum)
It does take time to get over the pneumonia.  However, you are wheezing today, so I think your asthma is also flaring up. I sent in a new inhaler for you to use twice a day.  I has medicines that both treat asthma and prevent asthma. Please continue to use your albuterol inhaler as needed. I also sent in a prescription for the same albuterol to use in the nebulizer machine.  Like your inhaler, you can use it every 6 hours as needed for shortness of breath or wheezing.  You can use it every four hours but call us.  It is a bad sign if you need it more often.   Check back with Dr Erin Hearing in 2-4 weeks.  Use the new inhaler regularly until then.  At that visit, you can tallk about how to use the new inhaler moving forward.

## 2021-07-26 ENCOUNTER — Encounter: Payer: Self-pay | Admitting: Family Medicine

## 2021-07-26 DIAGNOSIS — J189 Pneumonia, unspecified organism: Secondary | ICD-10-CM | POA: Insufficient documentation

## 2021-07-26 NOTE — Assessment & Plan Note (Signed)
Seems to be resolving as expected.

## 2021-07-26 NOTE — Progress Notes (Signed)
° ° °  SUBJECTIVE:   CHIEF COMPLAINT / HPI:   FU pneumonia.  Seen one week ago elsewhere with dx of pneumonia.  (Reivewed records, right middle lobe pneumonia.  COVID, Flu and RSV neg) Treated with antibiotics.  Fever has resolved.  Still mild SOB.  Cough is bad.  Also a little hoarse.  Has a history of previously well controled asthma.  She feels she is wheezing some. Only asthma med is albuterol MDI.  Not on a controler.  Requests a nebulizer.   Has diabetes.    OBJECTIVE:   BP (!) 157/86    Pulse 79    Ht 5\' 3"  (1.6 m)    Wt (!) 406 lb 9.6 oz (184.4 kg)    SpO2 96%    BMI 72.03 kg/m   Gen: speaking in full sentences.  No resp distress at rest Neck supple without sig nodes Lungs diffuse bilateral exp wheeze.    ASSESSMENT/PLAN:   Pneumonia Seems to be resolving as expected.  Asthma Symptoms now seem more from asthma flare rather than pneumonia.  I am reluctant to treat with oral steroids since diabetes and obesity.  Added symbicort as a controler.  Also, we were able to dispense a nebulizer from the [facility]  I prescribed albuterol for the nebulizer.     Zenia Resides, MD [place]

## 2021-07-26 NOTE — Assessment & Plan Note (Signed)
Symptoms now seem more from asthma flare rather than pneumonia.  I am reluctant to treat with oral steroids since diabetes and obesity.  Added symbicort as a controler.  Also, we were able to dispense a nebulizer from the [facility]  I prescribed albuterol for the nebulizer.

## 2021-08-01 ENCOUNTER — Telehealth: Payer: Self-pay

## 2021-08-01 NOTE — Telephone Encounter (Signed)
Patient contacted to schedule mammogram.  ? ?RE: Mobile Mammo event located at: ? ?[facility]  [facility]  ?      ?[address]    ?[place] [address]    ? ?Date: April 7th  ? ? ?

## 2021-10-02 ENCOUNTER — Other Ambulatory Visit (HOSPITAL_COMMUNITY): Payer: Self-pay

## 2021-10-10 ENCOUNTER — Encounter: Payer: Self-pay | Admitting: Family Medicine

## 2021-10-10 LAB — HM DIABETES EYE EXAM

## 2021-11-13 ENCOUNTER — Encounter: Payer: Self-pay | Admitting: *Deleted

## 2022-01-02 ENCOUNTER — Telehealth: Payer: Self-pay | Admitting: Family Medicine

## 2022-01-02 ENCOUNTER — Ambulatory Visit (INDEPENDENT_AMBULATORY_CARE_PROVIDER_SITE_OTHER): Payer: Medicaid Other | Admitting: Family Medicine

## 2022-01-02 ENCOUNTER — Encounter: Payer: Self-pay | Admitting: Family Medicine

## 2022-01-02 VITALS — BP 151/80 | HR 78 | Ht 64.0 in | Wt >= 6400 oz

## 2022-01-02 DIAGNOSIS — E119 Type 2 diabetes mellitus without complications: Secondary | ICD-10-CM | POA: Diagnosis present

## 2022-01-02 DIAGNOSIS — N939 Abnormal uterine and vaginal bleeding, unspecified: Secondary | ICD-10-CM

## 2022-01-02 DIAGNOSIS — M79604 Pain in right leg: Secondary | ICD-10-CM | POA: Diagnosis not present

## 2022-01-02 DIAGNOSIS — I10 Essential (primary) hypertension: Secondary | ICD-10-CM | POA: Diagnosis not present

## 2022-01-02 DIAGNOSIS — M79605 Pain in left leg: Secondary | ICD-10-CM | POA: Diagnosis not present

## 2022-01-02 LAB — POCT GLYCOSYLATED HEMOGLOBIN (HGB A1C): HbA1c, POC (controlled diabetic range): 8 % — AB (ref 0.0–7.0)

## 2022-01-02 MED ORDER — GABAPENTIN 100 MG PO CAPS: 100.0000 mg | ORAL_CAPSULE | Freq: Three times a day (TID) | ORAL | 0 refills | Status: DC

## 2022-01-02 MED ORDER — METFORMIN HCL ER 500 MG PO TB24: 500.0000 mg | ORAL_TABLET | Freq: Two times a day (BID) | ORAL | 0 refills | Status: DC

## 2022-01-02 MED ORDER — TRULICITY 0.75 MG/0.5ML ~~LOC~~ SOAJ: 0.7500 mg | SUBCUTANEOUS | 0 refills | Status: DC

## 2022-01-02 NOTE — Assessment & Plan Note (Signed)
BP Readings from Last 3 Encounters:  01/02/22 (!) 151/80  07/25/21 (!) 157/86  07/03/21 (!) 147/85   Above goal.  If does not come down with better treatment of diabetes likely start an ARB IF she will follow up.  Check labs today

## 2022-01-02 NOTE — Patient Instructions (Signed)
Good to see you today - Thank you for coming in  Things we discussed today:  I will call you if your lab tests are not normal.  Otherwise we will discuss them at your next visit.  Try Lamisil cream under your breasts  Bleeding I have put in a referral to Gyn.  They should call you to schedule an appointment.  This could appear as an unknown number on your phone.  If you have not heard from them in 2 weeks please let me know.  I have ordered an ultrasound  Restart the Truclicity   Please always bring your medication bottles  Come back to see me in 1 month

## 2022-01-02 NOTE — Assessment & Plan Note (Signed)
Persistent.  Given body habitus will refer to Gyn for exam.  Will obtain pelvic US.   Concern for cancer given age and persistence.   Check cbc

## 2022-01-02 NOTE — Progress Notes (Signed)
    SUBJECTIVE:   CHIEF COMPLAINT / HPI:   DIABETES Taking metformin several times a week.  Has trouble remembering.  Has been out of Trulicity for months but really wants to start back   INTERTRIGO With hot weather has itchy rash under both breasts.  Wonders what she can use  VAGINAL BLEEDING Heavy intermittent bleeding for years.  Seems to be getting worse.  Never followed up for pelvic US ordered in [place] like her legs are aching or cramping or restless at night.  Gabapentin 300 mg helps but "knocks her out"   PERTINENT  PMH / PSH: Taking care of 3 grandchildren  OBJECTIVE:   BP (!) 151/80   Pulse 78   Ht '5\' 4"'$  (1.626 m)   Wt (!) 423 lb (191.9 kg)   SpO2 95%   BMI 72.61 kg/m   Weight increased 17 lbs since Feb Heart - Regular rate and rhythm.  No murmurs, gallops or rubs.    Lungs:  Normal respiratory effort, chest expands symmetrically. Lungs are clear to auscultation, no crackles or wheezes. Exam limited by body habitus   ASSESSMENT/PLAN:   Hypertension BP Readings from Last 3 Encounters:  01/02/22 (!) 151/80  07/25/21 (!) 157/86  07/03/21 (!) 147/85   Above goal.  If does not come down with better treatment of diabetes likely start an ARB IF she will follow up.  Check labs today   Diabetes Poorly controlled.  Asked to take to metformin at least daily and restart Trulicity   LEG PAIN, BILATERAL Consistent with neuropathy.  Try lower dose of gabapentin.  Hopefully weight loss might help   Vaginal bleeding Persistent.  Given body habitus will refer to Gyn for exam.  Will obtain pelvic US.   Concern for cancer given age and persistence.   Check cbc      Lind Covert, MD [place]

## 2022-01-02 NOTE — Assessment & Plan Note (Signed)
Consistent with neuropathy.  Try lower dose of gabapentin.  Hopefully weight loss might help

## 2022-01-02 NOTE — Telephone Encounter (Signed)
Patient dropped off form at front desk for [employer].  Verified that patient section of form has been completed.  Last DOS/WCC with PCP was 01/02/22.  Placed form in red team folder to be completed by clinical staff.  Creig Hines

## 2022-01-02 NOTE — Assessment & Plan Note (Signed)
Poorly controlled.  Asked to take to metformin at least daily and restart Trulicity

## 2022-01-03 LAB — CBC
Hematocrit: 36.8 % (ref 34.0–46.6)
Hemoglobin: 11.2 g/dL (ref 11.1–15.9)
MCH: 25.2 pg — ABNORMAL LOW (ref 26.6–33.0)
MCHC: 30.4 g/dL — ABNORMAL LOW (ref 31.5–35.7)
MCV: 83 fL (ref 79–97)
Platelets: 274 10*3/uL (ref 150–450)
RBC: 4.44 x10E6/uL (ref 3.77–5.28)
RDW: 15.8 % — ABNORMAL HIGH (ref 11.7–15.4)
WBC: 6.7 10*3/uL (ref 3.4–10.8)

## 2022-01-03 LAB — CMP14+EGFR
ALT: 16 IU/L (ref 0–32)
AST: 19 IU/L (ref 0–40)
Albumin/Globulin Ratio: 1.1 — ABNORMAL LOW (ref 1.2–2.2)
Albumin: 4 g/dL (ref 3.8–4.9)
Alkaline Phosphatase: 131 IU/L — ABNORMAL HIGH (ref 44–121)
BUN/Creatinine Ratio: 8 — ABNORMAL LOW (ref 12–28)
BUN: 6 mg/dL — ABNORMAL LOW (ref 8–27)
Bilirubin Total: 0.2 mg/dL (ref 0.0–1.2)
CO2: 28 mmol/L (ref 20–29)
Calcium: 8.9 mg/dL (ref 8.7–10.3)
Chloride: 98 mmol/L (ref 96–106)
Creatinine, Ser: 0.71 mg/dL (ref 0.57–1.00)
Globulin, Total: 3.8 g/dL (ref 1.5–4.5)
Glucose: 162 mg/dL — ABNORMAL HIGH (ref 70–99)
Potassium: 4.1 mmol/L (ref 3.5–5.2)
Sodium: 138 mmol/L (ref 134–144)
Total Protein: 7.8 g/dL (ref 6.0–8.5)
eGFR: 97 mL/min/{1.73_m2} (ref >59)

## 2022-01-08 NOTE — Telephone Encounter (Signed)
Done placed in RN box

## 2022-01-08 NOTE — Telephone Encounter (Signed)
Reviewed form and placed in PCP's box for completion.  .Clifford Benninger R Jermiyah Ricotta, CMA  

## 2022-01-09 ENCOUNTER — Ambulatory Visit (HOSPITAL_BASED_OUTPATIENT_CLINIC_OR_DEPARTMENT_OTHER): Payer: Medicaid Other

## 2022-01-11 NOTE — Telephone Encounter (Signed)
Placard placed up front for pick up.   Copy made for batch scanning.   Patient aware.

## 2022-01-22 ENCOUNTER — Ambulatory Visit (HOSPITAL_BASED_OUTPATIENT_CLINIC_OR_DEPARTMENT_OTHER): Payer: Medicaid Other

## 2022-01-30 ENCOUNTER — Ambulatory Visit: Payer: Medicaid Other | Admitting: Family Medicine

## 2022-01-30 NOTE — Progress Notes (Deleted)
    SUBJECTIVE:   CHIEF COMPLAINT / HPI:   Hypertension Check labs today    Diabetes Asked to take to metformin at least daily and restart Trulicity    LEG PAIN, BILATERAL Try lower dose of gabapentin.     Vaginal bleeding Refer to Gyn for exam.  Will obtain pelvic US.   Concern for cancer given age and persistence.   Check cbc   PERTINENT  PMH / PSH: ***  OBJECTIVE:   There were no vitals taken for this visit.  ***  ASSESSMENT/PLAN:   No problem-specific Assessment & Plan notes found for this encounter.     Lind Covert, MD [employer]

## 2022-02-18 ENCOUNTER — Other Ambulatory Visit: Payer: Self-pay | Admitting: Family Medicine

## 2022-03-29 ENCOUNTER — Other Ambulatory Visit: Payer: Self-pay | Admitting: Family Medicine

## 2022-04-06 ENCOUNTER — Other Ambulatory Visit: Payer: Self-pay | Admitting: Family Medicine

## 2022-04-06 DIAGNOSIS — E119 Type 2 diabetes mellitus without complications: Secondary | ICD-10-CM

## 2022-04-09 ENCOUNTER — Other Ambulatory Visit (HOSPITAL_COMMUNITY)
Admission: RE | Admit: 2022-04-09 | Discharge: 2022-04-09 | Disposition: A | Discharge status: Home / Self Care | Payer: Medicaid Other | Source: Ambulatory Visit | Attending: Advanced Practice Midwife | Admitting: Advanced Practice Midwife

## 2022-04-09 ENCOUNTER — Encounter (HOSPITAL_BASED_OUTPATIENT_CLINIC_OR_DEPARTMENT_OTHER): Payer: Self-pay | Admitting: Advanced Practice Midwife

## 2022-04-09 ENCOUNTER — Ambulatory Visit (INDEPENDENT_AMBULATORY_CARE_PROVIDER_SITE_OTHER): Payer: Medicaid Other | Admitting: Advanced Practice Midwife

## 2022-04-09 VITALS — BP 185/64 | HR 77 | Ht 63.0 in | Wt >= 6400 oz

## 2022-04-09 DIAGNOSIS — Z6841 Body Mass Index (BMI) 40.0 and over, adult: Secondary | ICD-10-CM

## 2022-04-09 DIAGNOSIS — Z01419 Encounter for gynecological examination (general) (routine) without abnormal findings: Secondary | ICD-10-CM

## 2022-04-09 DIAGNOSIS — B379 Candidiasis, unspecified: Secondary | ICD-10-CM | POA: Diagnosis not present

## 2022-04-09 DIAGNOSIS — N950 Postmenopausal bleeding: Secondary | ICD-10-CM | POA: Diagnosis not present

## 2022-04-09 DIAGNOSIS — Z1231 Encounter for screening mammogram for malignant neoplasm of breast: Secondary | ICD-10-CM

## 2022-04-09 DIAGNOSIS — Z124 Encounter for screening for malignant neoplasm of cervix: Secondary | ICD-10-CM

## 2022-04-09 MED ORDER — NYSTATIN 100000 UNIT/GM EX POWD: 1.0000 | Freq: Three times a day (TID) | CUTANEOUS | 5 refills | Status: DC | PRN

## 2022-04-09 NOTE — Progress Notes (Signed)
Subjective:     Deborah Benson is a 61 y.o. female here at [facility] for a routine exam.  Current complaints: hx bad experience with pelvic exam, provider with comments about her weight, multiple providers and staff in room for exam, and difficulty performing speculum exam.  Pt has avoided gyn healthcare due to this experience. She reports she had 3+ years without menses in her early 1s but started having bleeding again at age 42 and has monthly moderate to heavy bleeding now.  There is come cramping but no severe pain with the bleeding.  She has itching and rash under her abdomen and breasts.    Personal health questionnaire reviewed: yes.  Do you have a primary care provider? yes Do you feel safe at home? yes  [place] Visit from 04/09/2022 in [place]  PHQ-2 Total Score 0       Health Maintenance Due  Topic Date Due   DTAP VACCINES (1) 02/16/1961   PAP SMEAR-Modifier  Never done   COLONOSCOPY (Pts 45-76yr Insurance coverage will need to be confirmed)  Never done   Zoster Vaccines- Shingrix (1 of 2) Never done   Diabetic kidney evaluation - Urine ACR  11/10/2012   MAMMOGRAM  08/07/2016   FOOT EXAM  09/11/2017   COVID-19 Vaccine (3 - Pfizer series) 01/23/2020   INFLUENZA VACCINE  01/08/2022     Risk factors for chronic health problems: Smoking: 5-7 cigarettes daily Alchohol/how much: None Pt BMI: Body mass index is 74.75 kg/m.   Gynecologic History No LMP recorded. Patient is postmenopausal. Contraception: post menopausal status Last Pap: unknown. Results were: normal per pt Last mammogram: 2016. Results were: normal  Obstetric History OB History  No obstetric history on file.     The following portions of the patient's history were reviewed and updated as appropriate: allergies, current medications, past family history, past medical history, past social history, past surgical history, and problem list.  Review of  Systems Pertinent items noted in HPI and remainder of comprehensive ROS otherwise negative.    Objective:   BP (!) 185/64 (BP Location: Right Wrist, Patient Position: Sitting, Cuff Size: Large)   Pulse 77   Ht '5\' 3"'$  (1.6 m) Comment: Reported  Wt (!) 422 lb (191.4 kg)   BMI 74.75 kg/m  VS reviewed, nursing note reviewed,  Constitutional: well developed, well nourished, no distress HEENT: normocephalic CV: normal rate Pulm/chest wall: normal effort Breast Exam:  exam performed: right breast normal without mass, skin or nipple changes or axillary nodes, left breast normal without mass, skin or nipple changes or axillary nodes Abdomen: soft Neuro: alert and oriented x 3 Skin: warm, dry Psych: affect normal Pelvic exam: Performed: Cervix pink, visually closed, without lesion, scant white creamy discharge, vaginal walls and external genitalia normal Bimanual exam: Cervix 0/long/high, firm, anterior, neg CMT, unable to palpate uterus/adnexa related to body habitus, but no tenderness or abnormalities on exam       Assessment/Plan:   1. Postmenopausal bleeding --Exam today wnl, no bleeding on exam --No changes in medications, no hormone therapy preceding onset of bleeding --Discussed need for further follow up for AUB/postmenopausal bleeding, including endometrial biopsy. --Will obtain [place] - [place]PELVIC COMPLETE WITH TRANSVAGINAL; Future  2. Well woman exam with routine gynecological exam --SSE without difficulty, pt tolerated well.  Discussed her previous experience, pt given opportunity to ask questions, participate in care today.  Pt happy with our office and care and reports she is  glad she came in.  - Cytology - PAP( [place])  3. Candidiasis --Under abdomen and breasts - nystatin (MYCOSTATIN/NYSTOP) powder; Apply 1 Application topically 3 (three) times daily as needed.  Dispense: 60 g; Refill: 5  4. Encounter for screening mammogram for malignant neoplasm of breast --Breast  exam wnl in office today - MM DIGITAL SCREENING BILATERAL; Future  5. Screening for cervical cancer   6. BMI 70 and over, adult [facility])    F/U with Dr Sabra Heck for [place] and further evaluation of AUB/postmenopausal bleeding.     Fatima Blank, CNM 10:12 AM

## 2022-04-12 DIAGNOSIS — B379 Candidiasis, unspecified: Secondary | ICD-10-CM | POA: Insufficient documentation

## 2022-04-12 DIAGNOSIS — N950 Postmenopausal bleeding: Secondary | ICD-10-CM | POA: Insufficient documentation

## 2022-04-12 LAB — CYTOLOGY - PAP
Adequacy: ABSENT
Comment: NEGATIVE
Diagnosis: NEGATIVE
Diagnosis: REACTIVE
High risk HPV: NEGATIVE

## 2022-04-17 ENCOUNTER — Ambulatory Visit (HOSPITAL_BASED_OUTPATIENT_CLINIC_OR_DEPARTMENT_OTHER)
Admission: RE | Admit: 2022-04-17 | Discharge: 2022-04-17 | Disposition: A | Discharge status: Home / Self Care | Payer: Medicaid Other | Source: Ambulatory Visit | Attending: Advanced Practice Midwife | Admitting: Advanced Practice Midwife

## 2022-04-17 ENCOUNTER — Other Ambulatory Visit (HOSPITAL_BASED_OUTPATIENT_CLINIC_OR_DEPARTMENT_OTHER): Payer: Self-pay | Admitting: Advanced Practice Midwife

## 2022-04-17 DIAGNOSIS — N950 Postmenopausal bleeding: Secondary | ICD-10-CM

## 2022-04-17 DIAGNOSIS — Z124 Encounter for screening for malignant neoplasm of cervix: Secondary | ICD-10-CM

## 2022-04-17 DIAGNOSIS — B379 Candidiasis, unspecified: Secondary | ICD-10-CM

## 2022-04-17 DIAGNOSIS — Z01419 Encounter for gynecological examination (general) (routine) without abnormal findings: Secondary | ICD-10-CM

## 2022-04-17 DIAGNOSIS — Z6841 Body Mass Index (BMI) 40.0 and over, adult: Secondary | ICD-10-CM

## 2022-04-17 DIAGNOSIS — Z1231 Encounter for screening mammogram for malignant neoplasm of breast: Secondary | ICD-10-CM

## 2022-04-19 ENCOUNTER — Other Ambulatory Visit (HOSPITAL_COMMUNITY)
Admission: RE | Admit: 2022-04-19 | Discharge: 2022-04-19 | Disposition: A | Discharge status: Home / Self Care | Payer: Medicaid Other | Source: Ambulatory Visit | Attending: Obstetrics & Gynecology | Admitting: Obstetrics & Gynecology

## 2022-04-19 ENCOUNTER — Encounter (HOSPITAL_BASED_OUTPATIENT_CLINIC_OR_DEPARTMENT_OTHER): Payer: Self-pay | Admitting: Obstetrics & Gynecology

## 2022-04-19 ENCOUNTER — Ambulatory Visit (INDEPENDENT_AMBULATORY_CARE_PROVIDER_SITE_OTHER): Payer: Medicaid Other | Admitting: Obstetrics & Gynecology

## 2022-04-19 VITALS — BP 158/73 | HR 91 | Ht 65.0 in | Wt >= 6400 oz

## 2022-04-19 DIAGNOSIS — N898 Other specified noninflammatory disorders of vagina: Secondary | ICD-10-CM | POA: Diagnosis present

## 2022-04-19 DIAGNOSIS — N950 Postmenopausal bleeding: Secondary | ICD-10-CM | POA: Insufficient documentation

## 2022-04-19 DIAGNOSIS — N8502 Endometrial intraepithelial neoplasia [EIN]: Secondary | ICD-10-CM

## 2022-04-19 MED ORDER — MEDROXYPROGESTERONE ACETATE 10 MG PO TABS: 10.0000 mg | ORAL_TABLET | Freq: Two times a day (BID) | ORAL | 0 refills | Status: DC

## 2022-04-19 NOTE — Progress Notes (Signed)
GYNECOLOGY  VISIT  CC:   PMP bleeding  HPI: 61 y.o. Single Black or [place] American female here for additional evaluation of PMP bleeding.  Bleeding has been going on for about two years.  Saw Fatima Blank, CNM, on 10/31.  Pt very nervous about exam.  Pap smear was obtained at that visit and was normal.  Reviewed results with pt.  Due to concerns about bleeding ultrasound was ordered.  Pt did go and have this done on Wednesday.  Has not been read yet but I reviewed the images personally.  Endometrium 4cm.  Ovaries not clearly seen.  Pt reports bleeding can be very heavy at times.  Does pass clots.  Does have some odor as well.  Not SA.  [place] pt really does need endometrial biopsy for additional evaluation.  We talked through this thoroughly before pt gave consent.  She's heard from friends how horrible this procedure can be.     Past Medical History:  Diagnosis Date   Arthritis    Asthma    Depression    Diabetes mellitus without complication ([place])    oral meds only   Gout    HTN (hypertension)    "borderline" med was stopped by pt- MD aware.   Neuromuscular disorder (HCC)    neuropathy -hands/ feet   Neuropathy    Obesity    BMI >76.6    MEDS:   Current Outpatient Medications on File Prior to Visit  Medication Sig Dispense Refill   albuterol (PROVENTIL) (2.5 MG/3ML) 0.083% nebulizer solution Take 3 mLs (2.5 mg total) by nebulization every 6 (six) hours as needed for wheezing or shortness of breath. 150 mL 1   budesonide-formoterol (SYMBICORT) 80-4.5 MCG/ACT inhaler Inhale 2 puffs into the lungs 2 (two) times daily. 1 each 3   gabapentin (NEURONTIN) 100 MG capsule Take 1 capsule (100 mg total) by mouth 3 (three) times daily. 90 capsule 0   metFORMIN (GLUCOPHAGE-XR) 500 MG 24 hr tablet TAKE 1 TABLET(500 MG) BY MOUTH IN THE MORNING AND AT BEDTIME 180 tablet 0   nystatin (MYCOSTATIN/NYSTOP) powder Apply 1 Application topically 3 (three) times daily as needed. 60 g 5   PROAIR  HFA 108 (90 Base) MCG/ACT inhaler INHALE 2 PUFFS BY MOUTH EVERY 6 HOURS AS NEEDED FOR SHORTNESS OF BREATH. PLEASE MAKE AN APPOINTMENT 17 g 0   TRULICITY 9.62 IW/9.7LG SOPN ADMINISTER 0.75 MG UNDER THE SKIN 1 TIME A WEEK 2 mL 0   No current facility-administered medications on file prior to visit.    ALLERGIES: Penicillins  SH:  single, non smoker  Review of Systems  Genitourinary:        PMP bleeding Vaginal odor    PHYSICAL EXAMINATION:    BP (!) 158/73 (BP Location: Right Wrist, Patient Position: Sitting, Cuff Size: Large)   Pulse 91   Ht '5\' 5"'$  (1.651 m) Comment: reported  Wt (!) 427 lb (193.7 kg)   BMI 71.06 kg/m     General appearance: alert, cooperative and appears stated age Lymph:  no inguinal LAD noted  Pelvic: External genitalia:  no lesions              Urethra:  normal appearing urethra with no masses, tenderness or lesions              Bartholins and Skenes: normal                 Vagina: normal appearing vagina with normal color and discharge, no lesions  Cervix: no lesions              Bimanual Exam:  Uterus:  normal size, contour, position, consistency, mobility, non-tender              Adnexa: no mass, fullness, tenderness  Endometrial biopsy recommended.  Discussed with patient.  Verbal and written consent obtained.   Procedure:  Speculum placed.  Cervix visualized and cleansed with betadine prep.  A single toothed tenaculum was applied to the anterior lip of the cervix.  Endometrial pipelle was advanced through the cervix into the endometrial cavity without difficulty.  Pipelle passed to7cm.  Suction applied and pipelle removed with good tissue sample obtained x 2 passes.  There was brisk bleeding present when biopsy was obtained. This was monitored and pressure held to cervical os with scopettes.  Bleeding did significantly improve.  Tenculum removed.  No bleeding noted from tenaculum site.  Patient tolerated procedure well and was pleasantly  surprised how much better this was than she expected.  Chaperone, Octaviano Batty, CMA, was present for exam.  Assessment/Plan: 1. Postmenopausal bleeding - will start on provera '10mg'$  BID to help with bleeding control - spoke with Dr. Berline Lopes about pt who is holding appt next Friday for her - Surgical pathology( [place]) - CBC - Iron, TIBC and Ferritin Panel  2. Vaginal discharge - Cervicovaginal ancillary only( [place])

## 2022-04-19 NOTE — Addendum Note (Signed)
Addended by: Blenda Nicely on: 04/19/2022 10:35 AM   Modules accepted: Orders

## 2022-04-20 ENCOUNTER — Other Ambulatory Visit (HOSPITAL_BASED_OUTPATIENT_CLINIC_OR_DEPARTMENT_OTHER): Payer: Self-pay | Admitting: Obstetrics & Gynecology

## 2022-04-22 LAB — CERVICOVAGINAL ANCILLARY ONLY
Bacterial Vaginitis (gardnerella): POSITIVE — AB
Candida Glabrata: NEGATIVE
Candida Vaginitis: NEGATIVE
Comment: NEGATIVE
Comment: NEGATIVE
Comment: NEGATIVE

## 2022-04-23 LAB — IRON,TIBC AND FERRITIN PANEL
Ferritin: 39 ng/mL (ref 15–150)
Iron Saturation: 13 % — ABNORMAL LOW (ref 15–55)
Iron: 43 ug/dL (ref 27–159)
Total Iron Binding Capacity: 333 ug/dL (ref 250–450)
UIBC: 290 ug/dL (ref 131–425)

## 2022-04-23 LAB — SURGICAL PATHOLOGY

## 2022-04-23 LAB — CBC
Hematocrit: 38.7 % (ref 34.0–46.6)
Hemoglobin: 11.5 g/dL (ref 11.1–15.9)
MCH: 23 pg — ABNORMAL LOW (ref 26.6–33.0)
MCHC: 29.7 g/dL — ABNORMAL LOW (ref 31.5–35.7)
MCV: 78 fL — ABNORMAL LOW (ref 79–97)
Platelets: 330 10*3/uL (ref 150–450)
RBC: 4.99 x10E6/uL (ref 3.77–5.28)
RDW: 16.7 % — ABNORMAL HIGH (ref 11.7–15.4)
WBC: 7.9 10*3/uL (ref 3.4–10.8)

## 2022-04-24 ENCOUNTER — Other Ambulatory Visit (HOSPITAL_BASED_OUTPATIENT_CLINIC_OR_DEPARTMENT_OTHER): Payer: Self-pay | Admitting: Obstetrics & Gynecology

## 2022-04-24 DIAGNOSIS — N8502 Endometrial intraepithelial neoplasia [EIN]: Secondary | ICD-10-CM

## 2022-04-24 DIAGNOSIS — Z1231 Encounter for screening mammogram for malignant neoplasm of breast: Secondary | ICD-10-CM

## 2022-04-24 MED ORDER — METRONIDAZOLE 500 MG PO TABS: 500.0000 mg | ORAL_TABLET | Freq: Two times a day (BID) | ORAL | 0 refills | Status: DC

## 2022-04-25 ENCOUNTER — Ambulatory Visit (HOSPITAL_BASED_OUTPATIENT_CLINIC_OR_DEPARTMENT_OTHER): Payer: Medicaid Other | Admitting: Radiology

## 2022-04-25 ENCOUNTER — Encounter: Payer: Self-pay | Admitting: Psychiatry

## 2022-04-29 ENCOUNTER — Ambulatory Visit (HOSPITAL_BASED_OUTPATIENT_CLINIC_OR_DEPARTMENT_OTHER): Payer: Medicaid Other | Admitting: Radiology

## 2022-05-05 NOTE — Progress Notes (Deleted)
GYNECOLOGIC ONCOLOGY NEW PATIENT CONSULTATION  Date of Service: 05/06/2022 Referring Provider: Hale Bogus, MD   ASSESSMENT AND PLAN: Deborah Benson is a 61 y.o. woman with ***.  ***  A copy of this note was sent to the patient's referring provider.  Bernadene Bell, MD Gynecologic Oncology   Medical Decision Making I personally spent  TOTAL *** minutes face-to-face and non-face-to-face in the care of this patient, which includes all pre, intra, and post visit time on the date of service.  *** minutes spent reviewing records prior to the visit *** Minutes in patient contact      *** minutes in other billable services *** minutes charting , conferring with consultants etc.   ------------  CC: {GynOnc Types:[id number]}  HISTORY OF PRESENT ILLNESS:  Deborah Benson is a 61 y.o. woman who is seen in consultation at the request of Hale Bogus, MD for evaluation of ***.   Patient presented to her OB/GYN for routine exam.  At that time she reported prior bad experience with pelvic exams.  She reported postmenopausal bleeding since the age of 50.  Exam at that time was overall normal although limited by body habitus.  Patient with unknown last Pap smear, so a Pap smear was collected at that time.  An ultrasound was ordered for workup of her postmenopausal bleeding.  The patient underwent a pelvic ultrasound on 04/17/2022 which noted a thickened endometrium to 41 mm.  She was then seen in follow-up by Dr. Sabra Heck on 04/19/2022 at which time an endometrial biopsy was collected.  She was also started on Provera 10 mg twice daily to help with bleeding.  The endometrial biopsy returned with EIN.  She had also had a vaginal swab collected which was positive for BV and she was prescribed Flagyl.  Today***   PAST MEDICAL HISTORY: Past Medical History:  Diagnosis Date   Arthritis    Asthma    Depression    Diabetes mellitus without complication ([place])    oral meds only   Gout    HTN  (hypertension)    "borderline" med was stopped by pt- MD aware.   Neuromuscular disorder (HCC)    neuropathy -hands/ feet   Neuropathy    Obesity    BMI >76.6    PAST SURGICAL HISTORY: Past Surgical History:  Procedure Laterality Date   FOOT SURGERY     due to needle break in foot   TONSILLECTOMY      OB/GYN HISTORY: OB History  No obstetric history on file.      Age at menarche: *** Age at menopause: *** Hx of HRT: *** Hx of STI: *** Last pap: *** History of abnormal pap smears: ***  SCREENING STUDIES:  Last mammogram: *** Last colonoscopy: ***  MEDICATIONS:  Current Outpatient Medications:    albuterol (PROVENTIL) (2.5 MG/3ML) 0.083% nebulizer solution, Take 3 mLs (2.5 mg total) by nebulization every 6 (six) hours as needed for wheezing or shortness of breath., Disp: 150 mL, Rfl: 1   gabapentin (NEURONTIN) 100 MG capsule, Take 1 capsule (100 mg total) by mouth 3 (three) times daily., Disp: 90 capsule, Rfl: 0   medroxyPROGESTERone (PROVERA) 10 MG tablet, Take 1 tablet (10 mg total) by mouth in the morning and at bedtime., Disp: 60 tablet, Rfl: 0   metFORMIN (GLUCOPHAGE-XR) 500 MG 24 hr tablet, TAKE 1 TABLET(500 MG) BY MOUTH IN THE MORNING AND AT BEDTIME, Disp: 180 tablet, Rfl: 0   metroNIDAZOLE (FLAGYL) 500 MG tablet, Take 1 tablet (500 mg  total) by mouth 2 (two) times daily., Disp: 14 tablet, Rfl: 0   nystatin (MYCOSTATIN/NYSTOP) powder, Apply 1 Application topically 3 (three) times daily as needed., Disp: 60 g, Rfl: 5   PROAIR HFA 108 (90 Base) MCG/ACT inhaler, INHALE 2 PUFFS BY MOUTH EVERY 6 HOURS AS NEEDED FOR SHORTNESS OF BREATH. PLEASE MAKE AN APPOINTMENT, Disp: 17 g, Rfl: 0   TRULICITY 7.61 PJ/0.9TO SOPN, ADMINISTER 0.75 MG UNDER THE SKIN 1 TIME A WEEK, Disp: 2 mL, Rfl: 0  ALLERGIES: Allergies  Allergen Reactions   Penicillins Hives    Denies airway involvement    FAMILY HISTORY: Family History  Problem Relation Age of Onset   Heart attack Mother     Stroke Mother    Gout Mother    Hypertension Mother    Lung cancer Father    Glaucoma Father    Heart disease Sister        fluid    Other Sister        spinal disease   Diabetes Brother    Dementia Maternal Grandfather    Glaucoma Other        nephew   Seizures Other        niece   Colon cancer Neg Hx    Breast cancer Neg Hx    Ovarian cancer Neg Hx    Endometrial cancer Neg Hx    Prostate cancer Neg Hx    Pancreatic cancer Neg Hx     SOCIAL HISTORY: Social History   Socioeconomic History   Marital status: Single    Spouse name: Not on file   Number of children: 0   Years of education: Not on file   Highest education level: Not on file  Occupational History   Occupation: disabled  Tobacco Use   Smoking status: Every Day    Packs/day: 0.40    Years: 0.40    Total pack years: 0.16    Types: Cigarettes    Start date: 06/11/1979   Smokeless tobacco: Never   Tobacco comments:    down to 5-7 per day - Previous 7-10  Substance and Sexual Activity   Alcohol use: No    Alcohol/week: 0.0 standard drinks of alcohol   Drug use: No   Sexual activity: Not Currently  Other Topics Concern   Not on file  Social History Narrative         Past Medical History:      Irregular menstrual periods      Asthma         Social History:      Disabled?,  Weight and OA; Moved to [place] 6 yrs ago.  Single         Social Determinants of Health   Financial Resource Strain: Not on file  Food Insecurity: Not on file  Transportation Needs: Not on file  Physical Activity: Not on file  Stress: Not on file  Social Connections: Not on file  Intimate Partner Violence: Not on file    REVIEW OF SYSTEMS: New patient intake form was reviewed.  Complete 10-system review is negative except for the following: ***  PHYSICAL EXAM: There were no vitals taken for this visit. Constitutional: No acute distress. Neuro/Psych: ***Alert, oriented.  Head and Neck: ***Normocephalic, atraumatic.  Neck symmetric without masses. Sclera anicteric.  Respiratory: ***Normal work of breathing. ***Clear to auscultation bilaterally. Cardiovascular: ***Regular rate and rhythm, no murmurs, rubs, or gallops. Breasts: {gi breast exam list:[id number]} Abdomen: ***Obese. ***Normoactive bowel sounds. ***Soft, non-distended, non-tender  to palpation. ***No masses or hepatosplenomegaly appreciated. ***No evidence of hernia. ***No palpable fluid wave. ***incisions. Extremities: ***Grossly normal range of motion. Warm, well perfused. No edema bilaterally. Skin: ***No rashes or lesions. Lymphatic: ***No cervical, supraclavicular, or inguinal adenopathy. Genitourinary: ***External genitalia without lesions. Urethral meatus ***without lesions or prolapse. On speculum exam, ***vagina and cervix without lesions. Bimanual exam reveals ***. ***Rectovaginal exam confirms the above findings and reveals normal sphincter tone and ***no masses or nodularity. Exam chaperoned by ***  LABORATORY AND RADIOLOGIC DATA: ***Outside medical records were reviewed to synthesize the above history, along with the history and physical obtained during the visit.  Outside ***laboratory, pathology, and imaging reports were reviewed, with pertinent results below.  ***I personally reviewed the outside images.  WBC  Date Value Ref Range Status  04/22/2022 7.9 3.4 - 10.8 x10E3/uL Final  08/10/2014 6.0 4.0 - 10.5 K/uL Final   Hemoglobin  Date Value Ref Range Status  04/22/2022 11.5 11.1 - 15.9 g/dL Final   Hematocrit  Date Value Ref Range Status  04/22/2022 38.7 34.0 - 46.6 % Final   Platelets  Date Value Ref Range Status  04/22/2022 330 150 - 450 x10E3/uL Final   Creat  Date Value Ref Range Status  10/25/2015 0.87 0.50 - 1.05 mg/dL Final   Creatinine, Ser  Date Value Ref Range Status  01/02/2022 0.71 0.57 - 1.00 mg/dL Final   AST  Date Value Ref Range Status  01/02/2022 19 0 - 40 IU/L Final   ALT  Date Value Ref Range  Status  01/02/2022 16 0 - 32 IU/L Final   Endometrial biopsy (04/19/22): FINAL MICROSCOPIC DIAGNOSIS:   A. ENDOMETRIUM, BIOPSY:  - Endometrial hyperplasia with atypia (EIN). See Comment.   COMMENT:  This case was reviewed with Dr. Saralyn Pilar who agrees with the above  diagnosis.   US PELVIS (TRANSABDOMINAL ONLY) 04/17/2022  Narrative CLINICAL DATA:  61 year old asymptomatic female with postmenopausal bleeding intermittently for 7-8 months.  EXAM: TRANSABDOMINAL ULTRASOUND OF PELVIS  TECHNIQUE: Transabdominal ultrasound examination of the pelvis was performed including evaluation of the uterus, ovaries, adnexal regions, and pelvic cul-de-sac. Transvaginal scan was declined by the patient.  COMPARISON:  None Available.  FINDINGS: Uterus  Measurements: 10.1 x 7.6 x 7.9 cm = volume: 319 mL. Mildly enlarged uterus with no uterine fibroids.  Endometrium  Thickness: 41 mm. Prominent abnormal endometrial thickening with no endometrial cavity fluid or focal endometrial mass.  Right ovary  Nonvisualization of the right ovary.  No right adnexal masses.  Left ovary  Nonvisualization of the left ovary.  No left adnexal masses.  Other findings:  No abnormal free fluid.  IMPRESSION: 1. Prominent abnormal endometrial thickening (41 mm). In the setting of post-menopausal bleeding, endometrial sampling is indicated to exclude carcinoma. If results are benign, sonohysterogram should be considered for focal lesion work-up. (Ref: Radiological Reasoning: Algorithmic Workup of Abnormal Vaginal Bleeding with Endovaginal Sonography and Sonohysterography. AJR 2008; 893:Y10-17). 2. Nonvisualization of the ovaries. No adnexal masses.   Electronically Signed By: Ilona Sorrel M.D. On: 04/20/2022 15:48

## 2022-05-06 ENCOUNTER — Inpatient Hospital Stay: Payer: Medicaid Other | Attending: Psychiatry | Admitting: Psychiatry

## 2022-05-07 NOTE — H&P (View-Only) (Signed)
GYNECOLOGIC ONCOLOGY NEW PATIENT CONSULTATION  Date of Service: 05/13/2022 Referring Provider: Hale Bogus, MD   ASSESSMENT AND PLAN: Deborah Benson is a 61 y.o. woman with EIN and medically complicate by morbid obesity (Body mass index is 72.95 kg/m.), diabetes, asthma and tobacco use.  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  We reviewed the relationship between obesity and the development of EIN and endometrial cancer.  We discussed that surgical management is the recommended treatment unless the patient is not a suitable surgical candidate or otherwise desiring to maintain fertility. Given her multiple medical co-morbidities (BMI, currently suboptimal respiratory status with active cough and rhonchi on exam, currently requiring frequent albuterol use with exertion) discussed that I am concerned she would not tolerate Trendelenburg position in order to safely accomplish a minimally invasive hysterectomy. Thus I feel she is not a suitable surgical candidate at this time, and medical management options are recommended for disease stabilization and reduction of risk in development of endometrial cancer. We did discussed that surgical management could possibly be an option in the future with optimization of her weight and respiratory status.   Given this, we discussed that I would recommend a dilation and curettage and IUD placement.  At that time we could place the patient in Trendelenburg position to see how she is able to tolerate intraoperatively.  We could also assess at that time her uterine descent in case of vaginal hysterectomy would be feasible.  However, based on exam today and the fact that patient has not had any vaginal deliveries, I suspect that this would also be very challenging.   We reviewed the role of progesterone therapy. We reviewed the 3 most  studied options, to include levonorgesterol IUD, oral medorxyprogesterone acetate, or oral megesterol acetate. She understands that all options have few side effects, most common being infrequent edema, GI disturbances, and thromboembolic events. Local progesterone through IUD may have a stronger effect on the endometrium with less systemic side effects.  Furthermore, studies demonstrate that 80-90% of women will have regression of their hyperplasia with progesterone use.    With IUD use, we anticipate that the average duration to regression is 4.5 months, with all cases anticipated to have regression by 9 months.     For monitoring, she understands that there is no recommend standard of care.  We will base her surveillance on GOG 224 protocol.  This will include EMB at 3-6 months following initiation of treatment.  EMBs will be performed at 3 to 6 month intervals, until a minimum of 3 negative biopsy results are obtained, after which sampling frequeny may then be yearly or until new abnormal uterine bleeding develops.  If persistence of EIN is noted by 9 months of treatment, then we will discuss additional agents or fitness/readiness for surgery.    We also reviewed the importance of weight loss in overall health as well as reduction in cancer risk.    Patient is in agreement with proceeding with D&C and IUD.Patient was consented for: dilation and curettage, intrauterine device insertion on 06/04/22.  The risks of surgery were discussed in detail and she understands these to including but not limited to bleeding requiring a blood transfusion, infection, injury to adjacent organs (including but not limited to the bowels, bladder, ureters, nerves, blood vessels), unforseen complication, possible need for re-exploration, and medical complications such as heart attack, stroke, pneumonia.  If the patient experiences  any of these events, she understands that her hospitalization or recovery may be prolonged  and that she may need to take additional medications for a prolonged period. The patient will receive DVT and antibiotic prophylaxis as indicated. She voiced a clear understanding. She had the opportunity to ask questions and written informed consent was obtained today. She wishes to proceed.  All preoperative instructions were reviewed. Postoperative expectations were also reviewed.   Last A1c in July. Will recheck today to get updated status on control.  A copy of this note was sent to the patient's referring provider.  Bernadene Bell, MD Gynecologic Oncology   Medical Decision Making I personally spent  TOTAL 48 minutes face-to-face and non-face-to-face in the care of this patient, which includes all pre, intra, and post visit time on the date of service.  3 minutes spent reviewing records prior to the visit 40 Minutes in patient contact 5 minutes charting , conferring with consultants etc.   ------------  CC: EIN  HISTORY OF PRESENT ILLNESS:  Deborah Benson is a 61 y.o. woman who is seen in consultation at the request of Hale Bogus, MD for evaluation of EIN.  Patient presented to her OB/GYN for routine exam.  At that time she reported prior bad experience with pelvic exams.  She reported postmenopausal bleeding since the age of 89.  Exam at that time was overall normal although limited by body habitus.  Patient with unknown last Pap smear, so a Pap smear was collected at that time.  An ultrasound was ordered for workup of her postmenopausal bleeding.  The patient underwent a pelvic ultrasound on 04/17/2022 which noted a thickened endometrium to 41 mm.  She was then seen in follow-up by Dr. Sabra Heck on 04/19/2022 at which time an endometrial biopsy was collected.  She was also started on Provera 10 mg twice daily to help with bleeding.  The endometrial biopsy returned with EIN.  She had also had a vaginal swab collected which was positive for BV and she was prescribed Flagyl.  Today,  patient presents alone.  She reports that she has stopped the Provera and still has some bleeding.  The bleeding is spotting on days and heavier on other days.  She did finish her Flagyl prescription as prescribed.  She notes a history of asthma for which she uses an inhaler when she is exerting herself but reports that she is usually fine at rest.  She follows with Dr. Talbert Cage as her PCP and denies requiring any maintenance medications.  She otherwise reports early satiety since starting Trulicity, a 7 to 56-monthhistory of urinary urgency and incontinence.  She otherwise denies bloating, significant weight loss, change in bowel habits.    PAST MEDICAL HISTORY: Past Medical History:  Diagnosis Date   Arthritis    Asthma    Depression    Diabetes mellitus without complication ([place]    oral meds only   Gout    HTN (hypertension)    "borderline" med was stopped by pt- MD aware.   Neuromuscular disorder (HCC)    neuropathy -hands/ feet   Neuropathy    Obesity    BMI >76.6    PAST SURGICAL HISTORY: Past Surgical History:  Procedure Laterality Date   FOOT SURGERY     due to needle break in foot   TONSILLECTOMY      OB/GYN HISTORY: OB History  Gravida Para Term Preterm AB Living  3       3  SAB IAB Ectopic Multiple Live Births  3            # Outcome Date GA Lbr Len/2nd Weight Sex Delivery Anes PTL Lv  3 SAB           2 SAB           1 SAB               Age at menarche: 60 Age at menopause: 51 Hx of HRT: no Hx of STI: no Last pap: 2023 History of abnormal pap smears: none  SCREENING STUDIES:  Last mammogram: Scheduled for 2023 Last colonoscopy: none, possible fecal test about 5 years ago per pt  MEDICATIONS:  Current Outpatient Medications:    albuterol (PROVENTIL) (2.5 MG/3ML) 0.083% nebulizer solution, Take 3 mLs (2.5 mg total) by nebulization every 6 (six) hours as needed for wheezing or shortness of breath., Disp: 150 mL, Rfl: 1   gabapentin  (NEURONTIN) 100 MG capsule, Take 1 capsule (100 mg total) by mouth 3 (three) times daily., Disp: 90 capsule, Rfl: 0   medroxyPROGESTERone (PROVERA) 10 MG tablet, Take 1 tablet (10 mg total) by mouth in the morning and at bedtime., Disp: 60 tablet, Rfl: 0   metFORMIN (GLUCOPHAGE-XR) 500 MG 24 hr tablet, TAKE 1 TABLET(500 MG) BY MOUTH IN THE MORNING AND AT BEDTIME, Disp: 180 tablet, Rfl: 0   metroNIDAZOLE (FLAGYL) 500 MG tablet, Take 1 tablet (500 mg total) by mouth 2 (two) times daily., Disp: 14 tablet, Rfl: 0   nystatin (MYCOSTATIN/NYSTOP) powder, Apply 1 Application topically 3 (three) times daily as needed., Disp: 60 g, Rfl: 5   PROAIR HFA 108 (90 Base) MCG/ACT inhaler, INHALE 2 PUFFS BY MOUTH EVERY 6 HOURS AS NEEDED FOR SHORTNESS OF BREATH. PLEASE MAKE AN APPOINTMENT, Disp: 17 g, Rfl: 0   TRULICITY 1.61 WR/6.0AV SOPN, ADMINISTER 0.75 MG UNDER THE SKIN 1 TIME A WEEK, Disp: 2 mL, Rfl: 0  ALLERGIES: Allergies  Allergen Reactions   Penicillins Hives    Denies airway involvement    FAMILY HISTORY: Family History  Problem Relation Age of Onset   Heart attack Mother    Stroke Mother    Gout Mother    Hypertension Mother    Lung cancer Father    Glaucoma Father    Heart disease Sister        fluid    Other Sister        spinal disease   Diabetes Brother    Cancer - Ovarian Maternal Grandmother        declined treatment   Dementia Maternal Grandfather    Glaucoma Other        nephew   Seizures Other        niece   Colon cancer Neg Hx    Breast cancer Neg Hx    Endometrial cancer Neg Hx    Prostate cancer Neg Hx    Pancreatic cancer Neg Hx     SOCIAL HISTORY: Social History   Socioeconomic History   Marital status: Single    Spouse name: Not on file   Number of children: 0   Years of education: Not on file   Highest education level: Not on file  Occupational History   Occupation: disabled  Tobacco Use   Smoking status: Every Day    Packs/day: 0.40    Types:  Cigarettes    Start date: 06/10/1978   Smokeless tobacco: Never   Tobacco comments:    down to  5-7 per day - Previous 7-10  Substance and Sexual Activity   Alcohol use: No    Alcohol/week: 0.0 standard drinks of alcohol   Drug use: No   Sexual activity: Not Currently  Other Topics Concern   Not on file  Social History Narrative         Past Medical History:      Irregular menstrual periods      Asthma         Social History:      Disabled?,  Weight and OA; Moved to [place] 6 yrs ago.  Single         Social Determinants of Health   Financial Resource Strain: Not on file  Food Insecurity: Not on file  Transportation Needs: Not on file  Physical Activity: Not on file  Stress: Not on file  Social Connections: Not on file  Intimate Partner Violence: Not on file    REVIEW OF SYSTEMS: New patient intake form was reviewed.  Complete 10-system review is negative except for the following: SOB, urinary frequency, menstrual problems, joint pain, rash, fevers/chills, wheezing, blood in urine, pelvic pain, numbness, some fatigue, abdominal pain, incontinence, vaginal bleeding, muscle pain, depression, cough, leg swelling, hot flashes, vaginal discharge, itching, problem with walking  PHYSICAL EXAM: BP (!) 153/77 (BP Location: Right Arm, Patient Position: Sitting) Comment (BP Location): forearm  Pulse 73   Temp 98.6 F (37 C) (Oral)   Resp 20   Ht '5\' 4"'$  (1.626 m)   Wt (!) 425 lb (192.8 kg) Comment: per pt weighed 2 weeks ago  SpO2 95%   BMI 72.95 kg/m  Constitutional: No acute distress. Neuro/Psych: Alert, oriented.  Head and Neck: Normocephalic, atraumatic. Neck symmetric without masses. Sclera anicteric.  Respiratory: Normal work of breathing.  Rhonchi in the right lung. Cardiovascular: Regular rate and rhythm, no murmurs, rubs, or gallops. Abdomen: Normoactive bowel sounds. Soft, non-distended, non-tender to palpation.  Extremities: Grossly normal range of motion. Warm, well  perfused. No edema bilaterally. Skin: No rashes or lesions. Lymphatic: No cervical, supraclavicular, or inguinal adenopathy. Genitourinary: External genitalia without lesions. Urethral meatus without lesions or prolapse. On speculum exam, vagina with thin blood in vault and cervix without lesions without active bleeding from os. Bimanual exam reveals normal cervix. Uterus unable to be palpated due to body habitus. Small descent with cough but other cervix well supported apically. Exam chaperoned by Joylene John, NP  LABORATORY AND RADIOLOGIC DATA: Outside medical records were reviewed to synthesize the above history, along with the history and physical obtained during the visit.  Outside laboratory, pathology, and imaging reports were reviewed, with pertinent results below.  I personally reviewed the outside images.  WBC  Date Value Ref Range Status  04/22/2022 7.9 3.4 - 10.8 x10E3/uL Final  08/10/2014 6.0 4.0 - 10.5 K/uL Final   Hemoglobin  Date Value Ref Range Status  04/22/2022 11.5 11.1 - 15.9 g/dL Final   Hematocrit  Date Value Ref Range Status  04/22/2022 38.7 34.0 - 46.6 % Final   Platelets  Date Value Ref Range Status  04/22/2022 330 150 - 450 x10E3/uL Final   Creat  Date Value Ref Range Status  10/25/2015 0.87 0.50 - 1.05 mg/dL Final   Creatinine, Ser  Date Value Ref Range Status  01/02/2022 0.71 0.57 - 1.00 mg/dL Final   AST  Date Value Ref Range Status  01/02/2022 19 0 - 40 IU/L Final   ALT  Date Value Ref Range Status  01/02/2022 16 0 -  32 IU/L Final   Endometrial biopsy (04/19/22): FINAL MICROSCOPIC DIAGNOSIS:   A. ENDOMETRIUM, BIOPSY:  - Endometrial hyperplasia with atypia (EIN). See Comment.   COMMENT:  This case was reviewed with Dr. Saralyn Pilar who agrees with the above  diagnosis.   US PELVIS (TRANSABDOMINAL ONLY) 04/17/2022  Narrative CLINICAL DATA:  61 year old asymptomatic female with postmenopausal bleeding intermittently for 7-8  months.  EXAM: TRANSABDOMINAL ULTRASOUND OF PELVIS  TECHNIQUE: Transabdominal ultrasound examination of the pelvis was performed including evaluation of the uterus, ovaries, adnexal regions, and pelvic cul-de-sac. Transvaginal scan was declined by the patient.  COMPARISON:  None Available.  FINDINGS: Uterus  Measurements: 10.1 x 7.6 x 7.9 cm = volume: 319 mL. Mildly enlarged uterus with no uterine fibroids.  Endometrium  Thickness: 41 mm. Prominent abnormal endometrial thickening with no endometrial cavity fluid or focal endometrial mass.  Right ovary  Nonvisualization of the right ovary.  No right adnexal masses.  Left ovary  Nonvisualization of the left ovary.  No left adnexal masses.  Other findings:  No abnormal free fluid.  IMPRESSION: 1. Prominent abnormal endometrial thickening (41 mm). In the setting of post-menopausal bleeding, endometrial sampling is indicated to exclude carcinoma. If results are benign, sonohysterogram should be considered for focal lesion work-up. (Ref: Radiological Reasoning: Algorithmic Workup of Abnormal Vaginal Bleeding with Endovaginal Sonography and Sonohysterography. AJR 2008; 390:Z00-92). 2. Nonvisualization of the ovaries. No adnexal masses.   Electronically Signed By: Ilona Sorrel M.D. On: 04/20/2022 15:48

## 2022-05-07 NOTE — Progress Notes (Signed)
This encounter was created in error - please disregard.

## 2022-05-07 NOTE — Progress Notes (Unsigned)
GYNECOLOGIC ONCOLOGY NEW PATIENT CONSULTATION  Date of Service: 05/13/2022 Referring Provider: Hale Bogus, MD   ASSESSMENT AND PLAN: Deborah Benson is a 61 y.o. woman with EIN and medically complicate by morbid obesity (Body mass index is 72.95 kg/m.), diabetes, asthma and tobacco use.  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  We reviewed the relationship between obesity and the development of EIN and endometrial cancer.  We discussed that surgical management is the recommended treatment unless the patient is not a suitable surgical candidate or otherwise desiring to maintain fertility. Given her multiple medical co-morbidities (BMI, currently suboptimal respiratory status with active cough and rhonchi on exam, currently requiring frequent albuterol use with exertion) discussed that I am concerned she would not tolerate Trendelenburg position in order to safely accomplish a minimally invasive hysterectomy. Thus I feel she is not a suitable surgical candidate at this time, and medical management options are recommended for disease stabilization and reduction of risk in development of endometrial cancer. We did discussed that surgical management could possibly be an option in the future with optimization of her weight and respiratory status.   Given this, we discussed that I would recommend a dilation and curettage and IUD placement.  At that time we could place the patient in Trendelenburg position to see how she is able to tolerate intraoperatively.  We could also assess at that time her uterine descent in case of vaginal hysterectomy would be feasible.  However, based on exam today and the fact that patient has not had any vaginal deliveries, I suspect that this would also be very challenging.   We reviewed the role of progesterone therapy. We reviewed the 3 most  studied options, to include levonorgesterol IUD, oral medorxyprogesterone acetate, or oral megesterol acetate. She understands that all options have few side effects, most common being infrequent edema, GI disturbances, and thromboembolic events. Local progesterone through IUD may have a stronger effect on the endometrium with less systemic side effects.  Furthermore, studies demonstrate that 80-90% of women will have regression of their hyperplasia with progesterone use.    With IUD use, we anticipate that the average duration to regression is 4.5 months, with all cases anticipated to have regression by 9 months.     For monitoring, she understands that there is no recommend standard of care.  We will base her surveillance on GOG 224 protocol.  This will include EMB at 3-6 months following initiation of treatment.  EMBs will be performed at 3 to 6 month intervals, until a minimum of 3 negative biopsy results are obtained, after which sampling frequeny may then be yearly or until new abnormal uterine bleeding develops.  If persistence of EIN is noted by 9 months of treatment, then we will discuss additional agents or fitness/readiness for surgery.    We also reviewed the importance of weight loss in overall health as well as reduction in cancer risk.    Patient is in agreement with proceeding with D&C and IUD.Patient was consented for: dilation and curettage, intrauterine device insertion on 06/04/22.  The risks of surgery were discussed in detail and she understands these to including but not limited to bleeding requiring a blood transfusion, infection, injury to adjacent organs (including but not limited to the bowels, bladder, ureters, nerves, blood vessels), unforseen complication, possible need for re-exploration, and medical complications such as heart attack, stroke, pneumonia.  If the patient experiences  any of these events, she understands that her hospitalization or recovery may be prolonged  and that she may need to take additional medications for a prolonged period. The patient will receive DVT and antibiotic prophylaxis as indicated. She voiced a clear understanding. She had the opportunity to ask questions and written informed consent was obtained today. She wishes to proceed.  All preoperative instructions were reviewed. Postoperative expectations were also reviewed.   Last A1c in July. Will recheck today to get updated status on control.  A copy of this note was sent to the patient's referring provider.  Bernadene Bell, MD Gynecologic Oncology   Medical Decision Making I personally spent  TOTAL 48 minutes face-to-face and non-face-to-face in the care of this patient, which includes all pre, intra, and post visit time on the date of service.  3 minutes spent reviewing records prior to the visit 40 Minutes in patient contact 5 minutes charting , conferring with consultants etc.   ------------  CC: EIN  HISTORY OF PRESENT ILLNESS:  Deborah Benson is a 61 y.o. woman who is seen in consultation at the request of Hale Bogus, MD for evaluation of EIN.  Patient presented to her OB/GYN for routine exam.  At that time she reported prior bad experience with pelvic exams.  She reported postmenopausal bleeding since the age of 20.  Exam at that time was overall normal although limited by body habitus.  Patient with unknown last Pap smear, so a Pap smear was collected at that time.  An ultrasound was ordered for workup of her postmenopausal bleeding.  The patient underwent a pelvic ultrasound on 04/17/2022 which noted a thickened endometrium to 41 mm.  She was then seen in follow-up by Dr. Sabra Heck on 04/19/2022 at which time an endometrial biopsy was collected.  She was also started on Provera 10 mg twice daily to help with bleeding.  The endometrial biopsy returned with EIN.  She had also had a vaginal swab collected which was positive for BV and she was prescribed Flagyl.  Today,  patient presents alone.  She reports that she has stopped the Provera and still has some bleeding.  The bleeding is spotting on days and heavier on other days.  She did finish her Flagyl prescription as prescribed.  She notes a history of asthma for which she uses an inhaler when she is exerting herself but reports that she is usually fine at rest.  She follows with Dr. Talbert Cage as her PCP and denies requiring any maintenance medications.  She otherwise reports early satiety since starting Trulicity, a 7 to 64-monthhistory of urinary urgency and incontinence.  She otherwise denies bloating, significant weight loss, change in bowel habits.    PAST MEDICAL HISTORY: Past Medical History:  Diagnosis Date   Arthritis    Asthma    Depression    Diabetes mellitus without complication (HNewton    oral meds only   Gout    HTN (hypertension)    "borderline" med was stopped by pt- MD aware.   Neuromuscular disorder (HCC)    neuropathy -hands/ feet   Neuropathy    Obesity    BMI >76.6    PAST SURGICAL HISTORY: Past Surgical History:  Procedure Laterality Date   FOOT SURGERY     due to needle break in foot   TONSILLECTOMY      OB/GYN HISTORY: OB History  Gravida Para Term Preterm AB Living  3       3  SAB IAB Ectopic Multiple Live Births  3            # Outcome Date GA Lbr Len/2nd Weight Sex Delivery Anes PTL Lv  3 SAB           2 SAB           1 SAB               Age at menarche: 55 Age at menopause: 63 Hx of HRT: no Hx of STI: no Last pap: 2023 History of abnormal pap smears: none  SCREENING STUDIES:  Last mammogram: Scheduled for 2023 Last colonoscopy: none, possible fecal test about 5 years ago per pt  MEDICATIONS:  Current Outpatient Medications:    albuterol (PROVENTIL) (2.5 MG/3ML) 0.083% nebulizer solution, Take 3 mLs (2.5 mg total) by nebulization every 6 (six) hours as needed for wheezing or shortness of breath., Disp: 150 mL, Rfl: 1   gabapentin  (NEURONTIN) 100 MG capsule, Take 1 capsule (100 mg total) by mouth 3 (three) times daily., Disp: 90 capsule, Rfl: 0   medroxyPROGESTERone (PROVERA) 10 MG tablet, Take 1 tablet (10 mg total) by mouth in the morning and at bedtime., Disp: 60 tablet, Rfl: 0   metFORMIN (GLUCOPHAGE-XR) 500 MG 24 hr tablet, TAKE 1 TABLET(500 MG) BY MOUTH IN THE MORNING AND AT BEDTIME, Disp: 180 tablet, Rfl: 0   metroNIDAZOLE (FLAGYL) 500 MG tablet, Take 1 tablet (500 mg total) by mouth 2 (two) times daily., Disp: 14 tablet, Rfl: 0   nystatin (MYCOSTATIN/NYSTOP) powder, Apply 1 Application topically 3 (three) times daily as needed., Disp: 60 g, Rfl: 5   PROAIR HFA 108 (90 Base) MCG/ACT inhaler, INHALE 2 PUFFS BY MOUTH EVERY 6 HOURS AS NEEDED FOR SHORTNESS OF BREATH. PLEASE MAKE AN APPOINTMENT, Disp: 17 g, Rfl: 0   TRULICITY 7.98 XQ/1.1HE SOPN, ADMINISTER 0.75 MG UNDER THE SKIN 1 TIME A WEEK, Disp: 2 mL, Rfl: 0  ALLERGIES: Allergies  Allergen Reactions   Penicillins Hives    Denies airway involvement    FAMILY HISTORY: Family History  Problem Relation Age of Onset   Heart attack Mother    Stroke Mother    Gout Mother    Hypertension Mother    Lung cancer Father    Glaucoma Father    Heart disease Sister        fluid    Other Sister        spinal disease   Diabetes Brother    Cancer - Ovarian Maternal Grandmother        declined treatment   Dementia Maternal Grandfather    Glaucoma Other        nephew   Seizures Other        niece   Colon cancer Neg Hx    Breast cancer Neg Hx    Endometrial cancer Neg Hx    Prostate cancer Neg Hx    Pancreatic cancer Neg Hx     SOCIAL HISTORY: Social History   Socioeconomic History   Marital status: Single    Spouse name: Not on file   Number of children: 0   Years of education: Not on file   Highest education level: Not on file  Occupational History   Occupation: disabled  Tobacco Use   Smoking status: Every Day    Packs/day: 0.40    Types:  Cigarettes    Start date: 06/10/1978   Smokeless tobacco: Never   Tobacco comments:    down to  5-7 per day - Previous 7-10  Substance and Sexual Activity   Alcohol use: No    Alcohol/week: 0.0 standard drinks of alcohol   Drug use: No   Sexual activity: Not Currently  Other Topics Concern   Not on file  Social History Narrative         Past Medical History:      Irregular menstrual periods      Asthma         Social History:      Disabled?,  Weight and OA; Moved to [place] 6 yrs ago.  Single         Social Determinants of Health   Financial Resource Strain: Not on file  Food Insecurity: Not on file  Transportation Needs: Not on file  Physical Activity: Not on file  Stress: Not on file  Social Connections: Not on file  Intimate Partner Violence: Not on file    REVIEW OF SYSTEMS: New patient intake form was reviewed.  Complete 10-system review is negative except for the following: SOB, urinary frequency, menstrual problems, joint pain, rash, fevers/chills, wheezing, blood in urine, pelvic pain, numbness, some fatigue, abdominal pain, incontinence, vaginal bleeding, muscle pain, depression, cough, leg swelling, hot flashes, vaginal discharge, itching, problem with walking  PHYSICAL EXAM: BP (!) 153/77 (BP Location: Right Arm, Patient Position: Sitting) Comment (BP Location): forearm  Pulse 73   Temp 98.6 F (37 C) (Oral)   Resp 20   Ht '5\' 4"'$  (1.626 m)   Wt (!) 425 lb (192.8 kg) Comment: per pt weighed 2 weeks ago  SpO2 95%   BMI 72.95 kg/m  Constitutional: No acute distress. Neuro/Psych: Alert, oriented.  Head and Neck: Normocephalic, atraumatic. Neck symmetric without masses. Sclera anicteric.  Respiratory: Normal work of breathing.  Rhonchi in the right lung. Cardiovascular: Regular rate and rhythm, no murmurs, rubs, or gallops. Abdomen: Normoactive bowel sounds. Soft, non-distended, non-tender to palpation.  Extremities: Grossly normal range of motion. Warm, well  perfused. No edema bilaterally. Skin: No rashes or lesions. Lymphatic: No cervical, supraclavicular, or inguinal adenopathy. Genitourinary: External genitalia without lesions. Urethral meatus without lesions or prolapse. On speculum exam, vagina with thin blood in vault and cervix without lesions without active bleeding from os. Bimanual exam reveals normal cervix. Uterus unable to be palpated due to body habitus. Small descent with cough but other cervix well supported apically. Exam chaperoned by Joylene John, NP  LABORATORY AND RADIOLOGIC DATA: Outside medical records were reviewed to synthesize the above history, along with the history and physical obtained during the visit.  Outside laboratory, pathology, and imaging reports were reviewed, with pertinent results below.  I personally reviewed the outside images.  WBC  Date Value Ref Range Status  04/22/2022 7.9 3.4 - 10.8 x10E3/uL Final  08/10/2014 6.0 4.0 - 10.5 K/uL Final   Hemoglobin  Date Value Ref Range Status  04/22/2022 11.5 11.1 - 15.9 g/dL Final   Hematocrit  Date Value Ref Range Status  04/22/2022 38.7 34.0 - 46.6 % Final   Platelets  Date Value Ref Range Status  04/22/2022 330 150 - 450 x10E3/uL Final   Creat  Date Value Ref Range Status  10/25/2015 0.87 0.50 - 1.05 mg/dL Final   Creatinine, Ser  Date Value Ref Range Status  01/02/2022 0.71 0.57 - 1.00 mg/dL Final   AST  Date Value Ref Range Status  01/02/2022 19 0 - 40 IU/L Final   ALT  Date Value Ref Range Status  01/02/2022 16 0 -  32 IU/L Final   Endometrial biopsy (04/19/22): FINAL MICROSCOPIC DIAGNOSIS:   A. ENDOMETRIUM, BIOPSY:  - Endometrial hyperplasia with atypia (EIN). See Comment.   COMMENT:  This case was reviewed with Dr. Saralyn Pilar who agrees with the above  diagnosis.   US PELVIS (TRANSABDOMINAL ONLY) 04/17/2022  Narrative CLINICAL DATA:  61 year old asymptomatic female with postmenopausal bleeding intermittently for 7-8  months.  EXAM: TRANSABDOMINAL ULTRASOUND OF PELVIS  TECHNIQUE: Transabdominal ultrasound examination of the pelvis was performed including evaluation of the uterus, ovaries, adnexal regions, and pelvic cul-de-sac. Transvaginal scan was declined by the patient.  COMPARISON:  None Available.  FINDINGS: Uterus  Measurements: 10.1 x 7.6 x 7.9 cm = volume: 319 mL. Mildly enlarged uterus with no uterine fibroids.  Endometrium  Thickness: 41 mm. Prominent abnormal endometrial thickening with no endometrial cavity fluid or focal endometrial mass.  Right ovary  Nonvisualization of the right ovary.  No right adnexal masses.  Left ovary  Nonvisualization of the left ovary.  No left adnexal masses.  Other findings:  No abnormal free fluid.  IMPRESSION: 1. Prominent abnormal endometrial thickening (41 mm). In the setting of post-menopausal bleeding, endometrial sampling is indicated to exclude carcinoma. If results are benign, sonohysterogram should be considered for focal lesion work-up. (Ref: Radiological Reasoning: Algorithmic Workup of Abnormal Vaginal Bleeding with Endovaginal Sonography and Sonohysterography. AJR 2008; 892:J19-41). 2. Nonvisualization of the ovaries. No adnexal masses.   Electronically Signed By: Ilona Sorrel M.D. On: 04/20/2022 15:48

## 2022-05-13 ENCOUNTER — Other Ambulatory Visit: Payer: Self-pay

## 2022-05-13 ENCOUNTER — Inpatient Hospital Stay (HOSPITAL_BASED_OUTPATIENT_CLINIC_OR_DEPARTMENT_OTHER): Payer: Medicaid Other | Admitting: Gynecologic Oncology

## 2022-05-13 ENCOUNTER — Encounter: Payer: Self-pay | Admitting: Psychiatry

## 2022-05-13 ENCOUNTER — Inpatient Hospital Stay: Payer: Medicaid Other

## 2022-05-13 ENCOUNTER — Inpatient Hospital Stay: Payer: Medicaid Other | Attending: Psychiatry | Admitting: Psychiatry

## 2022-05-13 ENCOUNTER — Encounter: Payer: Self-pay | Admitting: Gynecologic Oncology

## 2022-05-13 ENCOUNTER — Other Ambulatory Visit: Payer: Self-pay | Admitting: Family Medicine

## 2022-05-13 VITALS — BP 153/77 | HR 73 | Temp 98.6°F | Resp 20 | Ht 64.0 in | Wt >= 6400 oz

## 2022-05-13 DIAGNOSIS — G709 Myoneural disorder, unspecified: Secondary | ICD-10-CM | POA: Diagnosis not present

## 2022-05-13 DIAGNOSIS — F32A Depression, unspecified: Secondary | ICD-10-CM | POA: Insufficient documentation

## 2022-05-13 DIAGNOSIS — N8502 Endometrial intraepithelial neoplasia [EIN]: Secondary | ICD-10-CM | POA: Diagnosis present

## 2022-05-13 DIAGNOSIS — Z7984 Long term (current) use of oral hypoglycemic drugs: Secondary | ICD-10-CM | POA: Diagnosis not present

## 2022-05-13 DIAGNOSIS — Z8041 Family history of malignant neoplasm of ovary: Secondary | ICD-10-CM | POA: Insufficient documentation

## 2022-05-13 DIAGNOSIS — Z801 Family history of malignant neoplasm of trachea, bronchus and lung: Secondary | ICD-10-CM | POA: Diagnosis not present

## 2022-05-13 DIAGNOSIS — Z79899 Other long term (current) drug therapy: Secondary | ICD-10-CM | POA: Diagnosis not present

## 2022-05-13 DIAGNOSIS — M1990 Unspecified osteoarthritis, unspecified site: Secondary | ICD-10-CM | POA: Diagnosis not present

## 2022-05-13 DIAGNOSIS — N950 Postmenopausal bleeding: Secondary | ICD-10-CM | POA: Insufficient documentation

## 2022-05-13 DIAGNOSIS — J45909 Unspecified asthma, uncomplicated: Secondary | ICD-10-CM | POA: Insufficient documentation

## 2022-05-13 DIAGNOSIS — Z6841 Body Mass Index (BMI) 40.0 and over, adult: Secondary | ICD-10-CM | POA: Insufficient documentation

## 2022-05-13 DIAGNOSIS — N3941 Urge incontinence: Secondary | ICD-10-CM | POA: Diagnosis not present

## 2022-05-13 DIAGNOSIS — M109 Gout, unspecified: Secondary | ICD-10-CM | POA: Diagnosis not present

## 2022-05-13 DIAGNOSIS — I10 Essential (primary) hypertension: Secondary | ICD-10-CM | POA: Insufficient documentation

## 2022-05-13 DIAGNOSIS — E119 Type 2 diabetes mellitus without complications: Secondary | ICD-10-CM | POA: Diagnosis not present

## 2022-05-13 DIAGNOSIS — F17210 Nicotine dependence, cigarettes, uncomplicated: Secondary | ICD-10-CM | POA: Diagnosis not present

## 2022-05-13 DIAGNOSIS — R6881 Early satiety: Secondary | ICD-10-CM | POA: Insufficient documentation

## 2022-05-13 DIAGNOSIS — Z780 Asymptomatic menopausal state: Secondary | ICD-10-CM | POA: Diagnosis not present

## 2022-05-13 DIAGNOSIS — J4541 Moderate persistent asthma with (acute) exacerbation: Secondary | ICD-10-CM

## 2022-05-13 NOTE — Patient Instructions (Addendum)
Preparing for your Surgery  Plan for surgery on June 04, 2022 with Dr. Bernadene Bell at [facility] will be scheduled for pelvic examination under anesthesia, dilation and curettage of the uterus (dilating the cervix and sampling/scraping the lining of the uterus), Mirena IUD insertion.   Pre-operative Testing -You will receive a phone call from presurgical testing at [facility] to discuss surgery instructions and arrange for lab work if needed.  -Bring your insurance card, copy of an advanced directive if applicable, medication list.  -You should not be taking blood thinners or aspirin at least ten days prior to surgery unless instructed by your surgeon.  -Do not take supplements such as fish oil (omega 3), red yeast rice, turmeric before your surgery. You want to avoid medications with aspirin in them including headache powders such as BC or Goody's), Excedrin migraine.  Day Before Surgery at [facility] will be advised you can have clear liquids up until 3 hours before your surgery.    Your role in recovery Your role is to become active as soon as directed by your doctor, while still giving yourself time to heal.  Rest when you feel tired. You will be asked to do the following in order to speed your recovery:  - Cough and breathe deeply. This helps to clear and expand your lungs and can prevent pneumonia after surgery.  - [place]. Do mild physical activity. Walking or moving your legs help your circulation and body functions return to normal. Do not try to get up or walk alone the first time after surgery.   -If you develop swelling on one leg or the other, pain in the back of your leg, redness/warmth in one of your legs, please call the office or go to the Emergency Room to have a doppler to rule out a blood clot. For shortness of breath, chest pain-seek care in the Emergency Room as soon as possible. - Actively manage your pain. Managing  your pain lets you move in comfort. We will ask you to rate your pain on a scale of zero to 10. It is your responsibility to tell your doctor or nurse where and how much you hurt so your pain can be treated.  Special Considerations -Your final pathology results from surgery should be available around one week after surgery and the results will be relayed to you when available.  -FMLA forms can be faxed to [phone] and please allow 5-7 business days for completion.  Pain Management After Surgery -Make sure that you have Tylenol and Ibuprofen at home IF [place] to use on a regular basis after surgery for pain control. We recommend alternating the medications every hour to six hours since they work differently and are processed in the body differently for pain relief.  -Review the attached handout on narcotic use and their risks and side effects.   Bowel Regimen -It is important to prevent constipation and drink adequate amounts of liquids. You can stop taking this medication when you are not taking pain medication and you are back on your normal bowel routine.  Risks of Surgery Risks of surgery are low but include bleeding, infection, damage to surrounding structures, re-operation, blood clots, and very rarely death.  AFTER SURGERY INSTRUCTIONS  Return to work:  1-2 days if applicable  Activity: 1. Be up and out of the bed during the day.  Take a nap if needed.  You  may walk up steps but be careful and use the hand rail.  Stair climbing will tire you more than you think, you may need to stop part way and rest.   2. No lifting or straining for 1 week over 10 pounds. No pushing, pulling, straining for 1 week.  3. No driving for minimum 24 hours after surgery.  Do not drive if you are taking narcotic pain medicine and make sure that your reaction time has returned.   4. You can shower as soon as the next day after surgery. Shower daily. No tub baths or  submerging your body in water until cleared by your surgeon. If you have the soap that was given to you by pre-surgical testing that was used before surgery, you do not need to use it afterwards because this can irritate your incisions.   5. No sexual activity and nothing in the vagina for 4 weeks.  6. You may experience vaginal spotting and discharge after surgery.  The spotting is normal but if you experience heavy bleeding, call our office.  7. Take Tylenol or ibuprofen first for pain if you are able to take these medications and only use narcotic pain medication for severe pain not relieved by the Tylenol or Ibuprofen.  Monitor your Tylenol intake to a max of 4,000 mg in a 24 hour period. You can alternate these medications after surgery.  Diet: 1. Low sodium Heart Healthy Diet is recommended but you are cleared to resume your normal (before surgery) diet after your procedure.  2. It is safe to use a laxative, such as Miralax or Colace, if you have difficulty moving your bowels.   Wound Care: 1. Keep clean and dry.  Shower daily.  Reasons to call the Doctor: Fever - Oral temperature greater than 100.4 degrees Fahrenheit Foul-smelling vaginal discharge Difficulty urinating Nausea and vomiting Increased pain at the site of the incision that is unrelieved with pain medicine. Difficulty breathing with or without chest pain New calf pain especially if only on one side Sudden, continuing increased vaginal bleeding with or without clots.   Contacts: For questions or concerns you should contact:  Dr. Bernadene Bell at [place], NP at [phone]  After Hours: call [phone] and have the GYN Oncologist paged/contacted (after 5 pm or on the weekends).  Messages sent via mychart are for non-urgent matters and are not responded to after hours so for urgent needs, please call the after hours number.

## 2022-05-14 ENCOUNTER — Telehealth: Payer: Self-pay

## 2022-05-14 ENCOUNTER — Encounter: Payer: Self-pay | Admitting: Psychiatry

## 2022-05-14 ENCOUNTER — Ambulatory Visit (HOSPITAL_BASED_OUTPATIENT_CLINIC_OR_DEPARTMENT_OTHER)
Admission: RE | Admit: 2022-05-14 | Discharge: 2022-05-14 | Disposition: A | Discharge status: Home / Self Care | Payer: Medicaid Other | Source: Ambulatory Visit | Attending: Advanced Practice Midwife | Admitting: Advanced Practice Midwife

## 2022-05-14 DIAGNOSIS — Z1231 Encounter for screening mammogram for malignant neoplasm of breast: Secondary | ICD-10-CM | POA: Diagnosis present

## 2022-05-14 LAB — HEMOGLOBIN A1C
Hgb A1c MFr Bld: 7.8 % — ABNORMAL HIGH (ref 4.8–5.6)
Mean Plasma Glucose: 177 mg/dL

## 2022-05-14 NOTE — Telephone Encounter (Signed)
LVM for pt to call office regarding lab results. Per [facility], result is 7.8, in July it was 8

## 2022-05-15 NOTE — Telephone Encounter (Signed)
Pt is aware of normal HgbA1c result

## 2022-05-17 NOTE — Progress Notes (Signed)
Patient here for new patient consultation with Dr. Ernestina Patches and for a pre-operative appointment prior to her scheduled surgery on June 04, 2022. She is scheduled for  pelvic examination under anesthesia, dilation and curettage of the uterus, Mirena IUD insertion. The surgery was discussed in detail.  See after visit summary for additional details.    Discussed post-op pain management in detail including the aspects of the enhanced recovery pathway. Advised to use tylenol or ibuprofen if able to take these medications after the procedure as needed.  Discussed measures to take at home to prevent DVT including frequent mobility.  Reportable signs and symptoms of DVT discussed. Post-operative instructions discussed and expectations for after surgery.     5 minutes spent with the patient.  Verbalizing understanding of material discussed. No needs or concerns voiced at the end of the visit.   Advised patient to call for any needs.    This appointment is included in the global surgical bundle as pre-operative teaching and has no charge.

## 2022-05-17 NOTE — Patient Instructions (Signed)
Preparing for your Surgery   Plan for surgery on June 04, 2022 with Dr. Bernadene Bell at [place] will be scheduled for pelvic examination under anesthesia, dilation and curettage of the uterus (dilating the cervix and sampling/scraping the lining of the uterus), Mirena IUD insertion.    Pre-operative Testing -You will receive a phone call from presurgical testing at [facility] to discuss surgery instructions and arrange for lab work if needed.   -Bring your insurance card, copy of an advanced directive if applicable, medication list.   -You should not be taking blood thinners or aspirin at least ten days prior to surgery unless instructed by your surgeon.   -Do not take supplements such as fish oil (omega 3), red yeast rice, turmeric before your surgery. You want to avoid medications with aspirin in them including headache powders such as BC or Goody's), Excedrin migraine.   Day Before Surgery at [place] will be advised you can have clear liquids up until 3 hours before your surgery.     Your role in recovery Your role is to become active as soon as directed by your doctor, while still giving yourself time to heal.  Rest when you feel tired. You will be asked to do the following in order to speed your recovery:   - Cough and breathe deeply. This helps to clear and expand your lungs and can prevent pneumonia after surgery.  - [place]. Do mild physical activity. Walking or moving your legs help your circulation and body functions return to normal. Do not try to get up or walk alone the first time after surgery.   -If you develop swelling on one leg or the other, pain in the back of your leg, redness/warmth in one of your legs, please call the office or go to the Emergency Room to have a doppler to rule out a blood clot. For shortness of breath, chest pain-seek care in the Emergency Room as soon as possible. - Actively manage your pain.  Managing your pain lets you move in comfort. We will ask you to rate your pain on a scale of zero to 10. It is your responsibility to tell your doctor or nurse where and how much you hurt so your pain can be treated.   Special Considerations -Your final pathology results from surgery should be available around one week after surgery and the results will be relayed to you when available.   -FMLA forms can be faxed to [phone] and please allow 5-7 business days for completion.   Pain Management After Surgery -Make sure that you have Tylenol and Ibuprofen at home IF [place] to use on a regular basis after surgery for pain control. We recommend alternating the medications every hour to six hours since they work differently and are processed in the body differently for pain relief.   -Review the attached handout on narcotic use and their risks and side effects.    Bowel Regimen -It is important to prevent constipation and drink adequate amounts of liquids. You can stop taking this medication when you are not taking pain medication and you are back on your normal bowel routine.   Risks of Surgery Risks of surgery are low but include bleeding, infection, damage to surrounding structures, re-operation, blood clots, and very rarely death.   AFTER SURGERY INSTRUCTIONS   Return to work:  1-2 days if applicable   Activity: 1. Be  up and out of the bed during the day.  Take a nap if needed.  You may walk up steps but be careful and use the hand rail.  Stair climbing will tire you more than you think, you may need to stop part way and rest.    2. No lifting or straining for 1 week over 10 pounds. No pushing, pulling, straining for 1 week.   3. No driving for minimum 24 hours after surgery.  Do not drive if you are taking narcotic pain medicine and make sure that your reaction time has returned.    4. You can shower as soon as the next day after surgery. Shower daily.  No tub baths or submerging your body in water until cleared by your surgeon. If you have the soap that was given to you by pre-surgical testing that was used before surgery, you do not need to use it afterwards because this can irritate your incisions.    5. No sexual activity and nothing in the vagina for 4 weeks.   6. You may experience vaginal spotting and discharge after surgery.  The spotting is normal but if you experience heavy bleeding, call our office.   7. Take Tylenol or ibuprofen first for pain if you are able to take these medications and only use narcotic pain medication for severe pain not relieved by the Tylenol or Ibuprofen.  Monitor your Tylenol intake to a max of 4,000 mg in a 24 hour period. You can alternate these medications after surgery.   Diet: 1. Low sodium Heart Healthy Diet is recommended but you are cleared to resume your normal (before surgery) diet after your procedure.   2. It is safe to use a laxative, such as Miralax or Colace, if you have difficulty moving your bowels.    Wound Care: 1. Keep clean and dry.  Shower daily.   Reasons to call the Doctor: Fever - Oral temperature greater than 100.4 degrees Fahrenheit Foul-smelling vaginal discharge Difficulty urinating Nausea and vomiting Increased pain at the site of the incision that is unrelieved with pain medicine. Difficulty breathing with or without chest pain New calf pain especially if only on one side Sudden, continuing increased vaginal bleeding with or without clots.   Contacts: For questions or concerns you should contact:   Dr. Bernadene Bell at [place], NP at [phone]   After Hours: call [phone] and have the GYN Oncologist paged/contacted (after 5 pm or on the weekends).   Messages sent via mychart are for non-urgent matters and are not responded to after hours so for urgent needs, please call the after hours number.

## 2022-05-19 NOTE — Patient Instructions (Addendum)
SURGICAL WAITING ROOM VISITATION Patients having surgery or a procedure may have no more than 2 support people in the waiting area - these visitors may rotate in the visitor waiting room.   Children under the age of 75 must have an adult with them who is not the patient. If the patient needs to stay at the hospital during part of their recovery, the visitor guidelines for inpatient rooms apply.  PRE-OP VISITATION  Pre-op nurse will coordinate an appropriate time for 1 support person to accompany the patient in pre-op.  This support person may not rotate.  This visitor will be contacted when the time is appropriate for the visitor to come back in the pre-op area.  Please refer to the [facility] website for the visitor guidelines for Inpatients (after your surgery is over and you are in a regular room).  You are not required to quarantine at this time prior to your surgery. However, you must do this: Hand Hygiene often Do NOT share personal items Notify your provider if you are in close contact with someone who has COVID or you develop fever 100.4 or greater, new onset of sneezing, cough, sore throat, shortness of breath or body aches.  If you test positive for Covid or have been in contact with anyone that has tested positive in the last 10 days please notify you surgeon.    Your procedure is scheduled on:  Tuesday  June 04, 2022  Report to [facility] Main Entrance: [place] entrance where the [employer] is available.   Report to admitting at: 1045  AM  +++++Call this number if you have any questions or problems the morning of surgery [phone]  Do not eat food after Midnight the night prior to your surgery/procedure.  After Midnight you may have the following liquids until   10:00 AM DAY OF SURGERY  Clear Liquid Diet Water Black Coffee (sugar ok, NO MILK/CREAM OR CREAMERS)  Tea (sugar ok, NO MILK/CREAM OR CREAMERS) regular and decaf                              Plain Jell-O  with no fruit (NO RED)                                           Fruit ices (not with fruit pulp, NO RED)                                     Popsicles (NO RED)                                                                  Juice: apple, WHITE grape, WHITE cranberry Sports drinks like Gatorade or Powerade (NO RED)                FOLLOW  ANY ADDITIONAL PRE OP INSTRUCTIONS YOU RECEIVED FROM YOUR SURGEON'S OFFICE!!!   Oral Hygiene is also important to reduce your risk of infection.        Remember - BRUSH  YOUR TEETH THE MORNING OF SURGERY WITH YOUR REGULAR TOOTHPASTE  Do NOT smoke after Midnight the night before surgery.  Diabetic Medications:      Metformin- Take as usual on Monday 06-03-2022. DO NOT take Metformin on the day of your surgery, [id number]       Trulicity Injection: You may inject on 05-28-2022, Do not inject on the day of your surgery (06-04-22). You may resume injections the week after your surgery on Tuesday 06-11-2022     Take ONLY these medicines the morning of surgery with A SIP OF WATER: Gabapentin  If You have been diagnosed with Sleep Apnea - Bring CPAP mask and tubing day of surgery. We will provide you with a CPAP machine on the day of your surgery.                   You may not have any metal on your body including hair pins, jewelry, and body piercing  Do not wear make-up, lotions, powders, perfumes or deodorant  Do not wear nail polish including gel and S&S, artificial / acrylic nails, or any other type of covering on natural nails including finger and toenails. If you have artificial nails, gel coating, etc., that needs to be removed by a nail salon, Please have this removed prior to surgery. Not doing so may mean that your surgery could be cancelled or delayed if the Surgeon or anesthesia staff feels like they are unable to monitor you safely.   Do not shave 48 hours prior to surgery to avoid nicks in your skin which may contribute to  postoperative infections.   Patients discharged on the day of surgery will not be allowed to drive home.  Someone NEEDS to stay with you for the first 24 hours after anesthesia.  Do not bring your home medications to the hospital. The Pharmacy will dispense medications listed on your medication list to you during your admission in the Hospital.  Special Instructions: Bring a copy of your healthcare power of attorney and living will documents the day of surgery, if you wish to have them scanned into your [facility] Records- EPIC  Please read over the following fact sheets you were given: IF YOU HAVE QUESTIONS ABOUT YOUR PRE-OP INSTRUCTIONS, PLEASE CALL [phone]  ([place])   [place] - Preparing for Surgery Before surgery, you can play an important role.  Because skin is not sterile, your skin needs to be as free of germs as possible.  You can reduce the number of germs on your skin by washing with CHG (chlorahexidine gluconate) soap before surgery.  CHG is an antiseptic cleaner which kills germs and bonds with the skin to continue killing germs even after washing. Please DO NOT use if you have an allergy to CHG or antibacterial soaps.  If your skin becomes reddened/irritated stop using the CHG and inform your nurse when you arrive at Short Stay. Do not shave (including legs and underarms) for at least 48 hours prior to the first CHG shower.  You may shave your face/neck.  Please follow these instructions carefully:  1.  Shower with CHG Soap the night before surgery and the  morning of surgery.  2.  If you choose to wash your hair, wash your hair first as usual with your normal  shampoo.  3.  After you shampoo, rinse your hair and body thoroughly to remove the shampoo.  4.  Use CHG as you would any other liquid soap.  You can apply chg directly to the skin and wash.  Gently with a scrungie or clean washcloth.  5.  Apply the CHG Soap to your body ONLY FROM THE  NECK DOWN.   Do not use on face/ open                           Wound or open sores. Avoid contact with eyes, ears mouth and genitals (private parts).                       Wash face,  Genitals (private parts) with your normal soap.             6.  Wash thoroughly, paying special attention to the area where your  surgery  will be performed.  7.  Thoroughly rinse your body with warm water from the neck down.  8.  DO NOT shower/wash with your normal soap after using and rinsing off the CHG Soap.            9.  Pat yourself dry with a clean towel.            10.  Wear clean pajamas.            11.  Place clean sheets on your bed the night of your first shower and do not  sleep with pets.  ON THE DAY OF SURGERY : Do not apply any lotions/deodorants the morning of surgery.  Please wear clean clothes to the hospital/surgery center.    FAILURE TO FOLLOW THESE INSTRUCTIONS MAY RESULT IN THE CANCELLATION OF YOUR SURGERY  PATIENT SIGNATURE_________________________________  NURSE SIGNATURE__________________________________  ________________________________________________________________________

## 2022-05-19 NOTE — Progress Notes (Signed)
COVID Vaccine received:  _0  No _1  Yes Date of any COVID positive Test in last 90 days:  PCP - Talbert Cage, MD Cardiologist - none  Chest x-ray - 07-18-2021 EKG -  do at [facility] Stress Test -  ECHO -  Cardiac Cath -   PCR screen: _2  Ordered & Completed                      _3   No Order but Needs PROFEND                      _4   N/A for this surgery  Surgery Plan:  _5  Ambulatory                            _6  Outpatient in bed                            _7  Admit  Anesthesia:    _8  General  _9  Spinal                           _10   Choice _11   MAC  Pacemaker / ICD device _12  No _13  Yes        Device order form faxed _14  No    _15   Yes      Faxed to:  Spinal Cord Stimulator:_16  No _17  Yes      (Remind patient to bring remote DOS) Other Implants:   History of Sleep Apnea? _18  No _19  Yes   CPAP used?- _20  No _21  Yes    Does the patient monitor blood sugar? _22  No _23  Yes  _24  N/A Does patient have a [employer] or Dexacom? _25  No _26  Yes   Fasting Blood Sugar Ranges-  Checks Blood Sugar _____ times a day  Metformin- take usual dose on Monday 06-03-22 but DO NOT take on DOS-Tuesday 06-04-22  Last dose of GLP1 agonist-   Trulicity  (Tuesday)  GLP1 instructions: May inject on 05-28-2022, Do not inject on DOS (06-04-22) you may resume injections the week after your surgery on 06-11-2022  Last dose of SGLT-2 inhibitors- none SGLT-2 instructions:   Blood Thinner / Instructions:none Aspirin Instructions:  ERAS Protocol Ordered: _27  No  _28  Yes _29  No Drink Ordered Patient is to be NPO after:0430   Comments:   Activity level: Patient can / can not climb a flight of stairs without difficulty; _30  No CP  _31  No SOB, but would have ______   Patient can / can not perform ADLs without assistance.   Anesthesia review: DM2, smoker, asthma, HTN   Patient denies shortness of breath, fever, cough and chest pain at PAT appointment.  Patient verbalized understanding and agreement to the  Pre-Surgical Instructions that were given to them at this PAT appointment. Patient was also educated of the need to review these PAT instructions again prior to his/her surgery.I reviewed the appropriate phone numbers to call if they have any and questions or concerns.

## 2022-05-22 ENCOUNTER — Encounter (HOSPITAL_COMMUNITY)
Admission: RE | Admit: 2022-05-22 | Discharge: 2022-05-22 | Disposition: A | Discharge status: Home / Self Care | Payer: Medicaid Other | Source: Ambulatory Visit | Attending: Psychiatry | Admitting: Psychiatry

## 2022-05-22 ENCOUNTER — Encounter (HOSPITAL_COMMUNITY): Payer: Self-pay

## 2022-05-22 ENCOUNTER — Other Ambulatory Visit: Payer: Self-pay

## 2022-05-22 VITALS — BP 150/75 | HR 69 | Temp 99.0°F | Resp 22 | Ht 63.5 in | Wt >= 6400 oz

## 2022-05-22 DIAGNOSIS — I10 Essential (primary) hypertension: Secondary | ICD-10-CM | POA: Insufficient documentation

## 2022-05-22 DIAGNOSIS — E119 Type 2 diabetes mellitus without complications: Secondary | ICD-10-CM | POA: Insufficient documentation

## 2022-05-22 DIAGNOSIS — Z01818 Encounter for other preprocedural examination: Secondary | ICD-10-CM | POA: Insufficient documentation

## 2022-05-22 HISTORY — DX: Pneumonia, unspecified organism: J18.9

## 2022-05-22 HISTORY — DX: Malignant (primary) neoplasm, unspecified: C80.1

## 2022-05-22 LAB — BASIC METABOLIC PANEL
Anion gap: 6 (ref 5–15)
BUN: 7 mg/dL (ref 6–20)
CO2: 29 mmol/L (ref 22–32)
Calcium: 8.8 mg/dL — ABNORMAL LOW (ref 8.9–10.3)
Chloride: 102 mmol/L (ref 98–111)
Creatinine, Ser: 0.65 mg/dL (ref 0.44–1.00)
GFR, Estimated: >60 mL/min (ref >60)
Glucose, Bld: 129 mg/dL — ABNORMAL HIGH (ref 70–99)
Potassium: 4.2 mmol/L (ref 3.5–5.1)
Sodium: 137 mmol/L (ref 135–145)

## 2022-05-22 LAB — CBC
HCT: 36.5 % (ref 36.0–46.0)
Hemoglobin: 10.4 g/dL — ABNORMAL LOW (ref 12.0–15.0)
MCH: 23.6 pg — ABNORMAL LOW (ref 26.0–34.0)
MCHC: 28.5 g/dL — ABNORMAL LOW (ref 30.0–36.0)
MCV: 83 fL (ref 80.0–100.0)
Platelets: 282 10*3/uL (ref 150–400)
RBC: 4.4 MIL/uL (ref 3.87–5.11)
RDW: 20.2 % — ABNORMAL HIGH (ref 11.5–15.5)
WBC: 6.7 10*3/uL (ref 4.0–10.5)
nRBC: 0 % (ref 0.0–0.2)

## 2022-05-22 LAB — GLUCOSE, CAPILLARY: Glucose-Capillary: 117 mg/dL — ABNORMAL HIGH (ref 70–99)

## 2022-05-31 ENCOUNTER — Telehealth: Payer: Self-pay

## 2022-05-31 NOTE — Telephone Encounter (Signed)
Placed call to Deborah Benson to review pre-op instructions. Patient stated that she has reviewed pre-op instructions provided. This Probation officer reviewed pre-admission testing documentation with patient including instructions for which medications she should and should not take on ay of surgery. Patient states that she felt comfortable with the instructions and had no questions. Encouraged patient to call with questions or concerns during clinic hours today. She understands that she will receive a post-op phone call on 06/05/22 from this clinic.

## 2022-06-04 ENCOUNTER — Ambulatory Visit (HOSPITAL_BASED_OUTPATIENT_CLINIC_OR_DEPARTMENT_OTHER): Payer: Medicaid Other | Admitting: Anesthesiology

## 2022-06-04 ENCOUNTER — Ambulatory Visit (HOSPITAL_COMMUNITY): Payer: Medicaid Other | Admitting: Physician Assistant

## 2022-06-04 ENCOUNTER — Other Ambulatory Visit: Payer: Self-pay

## 2022-06-04 ENCOUNTER — Ambulatory Visit (HOSPITAL_COMMUNITY): Payer: Medicaid Other

## 2022-06-04 ENCOUNTER — Inpatient Hospital Stay (HOSPITAL_COMMUNITY)
Admission: AD | Admit: 2022-06-04 | Discharge: 2022-06-08 | DRG: 987 | Disposition: A | Discharge status: Home / Self Care | Payer: Medicaid Other | Attending: Internal Medicine | Admitting: Internal Medicine

## 2022-06-04 ENCOUNTER — Encounter (HOSPITAL_COMMUNITY): Payer: Self-pay | Admitting: Psychiatry

## 2022-06-04 ENCOUNTER — Encounter (HOSPITAL_COMMUNITY)
Admission: AD | Disposition: A | Discharge status: Home / Self Care | Payer: Self-pay | Source: Home / Self Care | Attending: Internal Medicine

## 2022-06-04 DIAGNOSIS — D649 Anemia, unspecified: Secondary | ICD-10-CM | POA: Diagnosis present

## 2022-06-04 DIAGNOSIS — R6881 Early satiety: Secondary | ICD-10-CM | POA: Diagnosis present

## 2022-06-04 DIAGNOSIS — F17210 Nicotine dependence, cigarettes, uncomplicated: Secondary | ICD-10-CM | POA: Diagnosis present

## 2022-06-04 DIAGNOSIS — E1140 Type 2 diabetes mellitus with diabetic neuropathy, unspecified: Secondary | ICD-10-CM | POA: Diagnosis not present

## 2022-06-04 DIAGNOSIS — N8502 Endometrial intraepithelial neoplasia [EIN]: Secondary | ICD-10-CM

## 2022-06-04 DIAGNOSIS — I10 Essential (primary) hypertension: Secondary | ICD-10-CM | POA: Diagnosis present

## 2022-06-04 DIAGNOSIS — Z30430 Encounter for insertion of intrauterine contraceptive device: Secondary | ICD-10-CM

## 2022-06-04 DIAGNOSIS — J45909 Unspecified asthma, uncomplicated: Secondary | ICD-10-CM | POA: Diagnosis not present

## 2022-06-04 DIAGNOSIS — Z833 Family history of diabetes mellitus: Secondary | ICD-10-CM

## 2022-06-04 DIAGNOSIS — Z66 Do not resuscitate: Secondary | ICD-10-CM | POA: Diagnosis present

## 2022-06-04 DIAGNOSIS — Z83511 Family history of glaucoma: Secondary | ICD-10-CM

## 2022-06-04 DIAGNOSIS — F17200 Nicotine dependence, unspecified, uncomplicated: Secondary | ICD-10-CM | POA: Diagnosis present

## 2022-06-04 DIAGNOSIS — J45901 Unspecified asthma with (acute) exacerbation: Secondary | ICD-10-CM | POA: Diagnosis present

## 2022-06-04 DIAGNOSIS — N3941 Urge incontinence: Secondary | ICD-10-CM | POA: Diagnosis present

## 2022-06-04 DIAGNOSIS — F32A Depression, unspecified: Secondary | ICD-10-CM | POA: Diagnosis present

## 2022-06-04 DIAGNOSIS — Z880 Allergy status to penicillin: Secondary | ICD-10-CM

## 2022-06-04 DIAGNOSIS — J9811 Atelectasis: Secondary | ICD-10-CM | POA: Diagnosis present

## 2022-06-04 DIAGNOSIS — Z79899 Other long term (current) drug therapy: Secondary | ICD-10-CM

## 2022-06-04 DIAGNOSIS — Z515 Encounter for palliative care: Secondary | ICD-10-CM | POA: Diagnosis not present

## 2022-06-04 DIAGNOSIS — T380X5A Adverse effect of glucocorticoids and synthetic analogues, initial encounter: Secondary | ICD-10-CM | POA: Diagnosis present

## 2022-06-04 DIAGNOSIS — Z8543 Personal history of malignant neoplasm of ovary: Secondary | ICD-10-CM

## 2022-06-04 DIAGNOSIS — D509 Iron deficiency anemia, unspecified: Secondary | ICD-10-CM | POA: Diagnosis present

## 2022-06-04 DIAGNOSIS — Z6841 Body Mass Index (BMI) 40.0 and over, adult: Secondary | ICD-10-CM | POA: Diagnosis not present

## 2022-06-04 DIAGNOSIS — E875 Hyperkalemia: Secondary | ICD-10-CM | POA: Diagnosis present

## 2022-06-04 DIAGNOSIS — N950 Postmenopausal bleeding: Secondary | ICD-10-CM | POA: Diagnosis present

## 2022-06-04 DIAGNOSIS — J9589 Other postprocedural complications and disorders of respiratory system, not elsewhere classified: Secondary | ICD-10-CM | POA: Diagnosis present

## 2022-06-04 DIAGNOSIS — J9602 Acute respiratory failure with hypercapnia: Secondary | ICD-10-CM | POA: Diagnosis not present

## 2022-06-04 DIAGNOSIS — Z801 Family history of malignant neoplasm of trachea, bronchus and lung: Secondary | ICD-10-CM

## 2022-06-04 DIAGNOSIS — Z1152 Encounter for screening for COVID-19: Secondary | ICD-10-CM

## 2022-06-04 DIAGNOSIS — R0609 Other forms of dyspnea: Secondary | ICD-10-CM | POA: Diagnosis not present

## 2022-06-04 DIAGNOSIS — E662 Morbid (severe) obesity with alveolar hypoventilation: Secondary | ICD-10-CM | POA: Diagnosis not present

## 2022-06-04 DIAGNOSIS — M109 Gout, unspecified: Secondary | ICD-10-CM | POA: Diagnosis present

## 2022-06-04 DIAGNOSIS — J441 Chronic obstructive pulmonary disease with (acute) exacerbation: Principal | ICD-10-CM | POA: Diagnosis present

## 2022-06-04 DIAGNOSIS — J4541 Moderate persistent asthma with (acute) exacerbation: Secondary | ICD-10-CM | POA: Diagnosis present

## 2022-06-04 DIAGNOSIS — Z7985 Long-term (current) use of injectable non-insulin antidiabetic drugs: Secondary | ICD-10-CM

## 2022-06-04 DIAGNOSIS — Z7984 Long term (current) use of oral hypoglycemic drugs: Secondary | ICD-10-CM

## 2022-06-04 DIAGNOSIS — J9601 Acute respiratory failure with hypoxia: Secondary | ICD-10-CM | POA: Diagnosis not present

## 2022-06-04 DIAGNOSIS — R0902 Hypoxemia: Secondary | ICD-10-CM | POA: Diagnosis present

## 2022-06-04 DIAGNOSIS — C541 Malignant neoplasm of endometrium: Secondary | ICD-10-CM | POA: Diagnosis not present

## 2022-06-04 DIAGNOSIS — E119 Type 2 diabetes mellitus without complications: Secondary | ICD-10-CM | POA: Diagnosis not present

## 2022-06-04 DIAGNOSIS — Z8249 Family history of ischemic heart disease and other diseases of the circulatory system: Secondary | ICD-10-CM

## 2022-06-04 DIAGNOSIS — J9621 Acute and chronic respiratory failure with hypoxia: Secondary | ICD-10-CM | POA: Diagnosis not present

## 2022-06-04 DIAGNOSIS — Z823 Family history of stroke: Secondary | ICD-10-CM

## 2022-06-04 HISTORY — PX: HYSTEROSCOPY WITH D & C: SHX1775

## 2022-06-04 HISTORY — PX: INTRAUTERINE DEVICE (IUD) INSERTION: SHX5877

## 2022-06-04 LAB — BLOOD GAS, ARTERIAL
Acid-Base Excess: 4.3 mmol/L — ABNORMAL HIGH (ref 0.0–2.0)
Bicarbonate: 31.8 mmol/L — ABNORMAL HIGH (ref 20.0–28.0)
O2 Saturation: 96.4 %
Patient temperature: 37
pCO2 arterial: 59 mmHg — ABNORMAL HIGH (ref 32–48)
pH, Arterial: 7.34 — ABNORMAL LOW (ref 7.35–7.45)
pO2, Arterial: 75 mmHg — ABNORMAL LOW (ref 83–108)

## 2022-06-04 LAB — COMPREHENSIVE METABOLIC PANEL
ALT: 17 U/L (ref 0–44)
AST: 22 U/L (ref 15–41)
Albumin: 3.3 g/dL — ABNORMAL LOW (ref 3.5–5.0)
Alkaline Phosphatase: 75 U/L (ref 38–126)
Anion gap: 8 (ref 5–15)
BUN: 8 mg/dL (ref 6–20)
CO2: 27 mmol/L (ref 22–32)
Calcium: 8.3 mg/dL — ABNORMAL LOW (ref 8.9–10.3)
Chloride: 100 mmol/L (ref 98–111)
Creatinine, Ser: 0.82 mg/dL (ref 0.44–1.00)
GFR, Estimated: >60 mL/min (ref >60)
Glucose, Bld: 197 mg/dL — ABNORMAL HIGH (ref 70–99)
Potassium: 5 mmol/L (ref 3.5–5.1)
Sodium: 135 mmol/L (ref 135–145)
Total Bilirubin: 0.5 mg/dL (ref 0.3–1.2)
Total Protein: 9 g/dL — ABNORMAL HIGH (ref 6.5–8.1)

## 2022-06-04 LAB — CBC WITH DIFFERENTIAL/PLATELET
Abs Immature Granulocytes: 0.05 10*3/uL (ref 0.00–0.07)
Basophils Absolute: 0 10*3/uL (ref 0.0–0.1)
Basophils Relative: 0 %
Eosinophils Absolute: 0 10*3/uL (ref 0.0–0.5)
Eosinophils Relative: 0 %
HCT: 36.8 % (ref 36.0–46.0)
Hemoglobin: 10.5 g/dL — ABNORMAL LOW (ref 12.0–15.0)
Immature Granulocytes: 0 %
Lymphocytes Relative: 5 %
Lymphs Abs: 0.6 10*3/uL — ABNORMAL LOW (ref 0.7–4.0)
MCH: 23.5 pg — ABNORMAL LOW (ref 26.0–34.0)
MCHC: 28.5 g/dL — ABNORMAL LOW (ref 30.0–36.0)
MCV: 82.3 fL (ref 80.0–100.0)
Monocytes Absolute: 0.1 10*3/uL (ref 0.1–1.0)
Monocytes Relative: 1 %
Neutro Abs: 10.7 10*3/uL — ABNORMAL HIGH (ref 1.7–7.7)
Neutrophils Relative %: 94 %
Platelets: 267 10*3/uL (ref 150–400)
RBC: 4.47 MIL/uL (ref 3.87–5.11)
RDW: 19.7 % — ABNORMAL HIGH (ref 11.5–15.5)
WBC: 11.4 10*3/uL — ABNORMAL HIGH (ref 4.0–10.5)
nRBC: 0 % (ref 0.0–0.2)

## 2022-06-04 LAB — GLUCOSE, CAPILLARY
Glucose-Capillary: 146 mg/dL — ABNORMAL HIGH (ref 70–99)
Glucose-Capillary: 177 mg/dL — ABNORMAL HIGH (ref 70–99)
Glucose-Capillary: 307 mg/dL — ABNORMAL HIGH (ref 70–99)

## 2022-06-04 LAB — FOLATE: Folate: 11.6 ng/mL (ref >5.9)

## 2022-06-04 LAB — RESP PANEL BY RT-PCR (RSV, FLU A&B, COVID)  RVPGX2
Influenza A by PCR: NEGATIVE
Influenza B by PCR: NEGATIVE
Resp Syncytial Virus by PCR: NEGATIVE
SARS Coronavirus 2 by RT PCR: NEGATIVE

## 2022-06-04 LAB — RETICULOCYTES
Immature Retic Fract: 32.2 % — ABNORMAL HIGH (ref 2.3–15.9)
RBC.: 2.08 MIL/uL — ABNORMAL LOW (ref 3.87–5.11)
Retic Count, Absolute: 37.2 10*3/uL (ref 19.0–186.0)
Retic Ct Pct: 1.8 % (ref 0.4–3.1)

## 2022-06-04 LAB — MAGNESIUM: Magnesium: 2.3 mg/dL (ref 1.7–2.4)

## 2022-06-04 LAB — CK: Total CK: 128 U/L (ref 38–234)

## 2022-06-04 LAB — LACTIC ACID, PLASMA: Lactic Acid, Venous: 1.6 mmol/L (ref 0.5–1.9)

## 2022-06-04 LAB — PHOSPHORUS: Phosphorus: 3.6 mg/dL (ref 2.5–4.6)

## 2022-06-04 SURGERY — EXAM UNDER ANESTHESIA
Anesthesia: General

## 2022-06-04 MED ORDER — HYDROCODONE-ACETAMINOPHEN 5-325 MG PO TABS
1.0000 | ORAL_TABLET | ORAL | Status: DC | PRN
  Administered 2022-06-05 – 2022-06-07 (×5): 2 via ORAL
  Filled 2022-06-04 (×4): qty 2

## 2022-06-04 MED ORDER — ACETAMINOPHEN 500 MG PO TABS
1000.0000 mg | ORAL_TABLET | ORAL | Status: AC
  Administered 2022-06-04: 1000 mg via ORAL
  Filled 2022-06-04: qty 2

## 2022-06-04 MED ORDER — PROMETHAZINE HCL 25 MG/ML IJ SOLN: 6.2500 mg | INTRAMUSCULAR | Status: DC | PRN

## 2022-06-04 MED ORDER — ENOXAPARIN SODIUM 40 MG/0.4ML IJ SOSY
40.0000 mg | PREFILLED_SYRINGE | INTRAMUSCULAR | Status: DC
  Administered 2022-06-05 – 2022-06-06 (×2): 40 mg via SUBCUTANEOUS
  Filled 2022-06-04 (×2): qty 0.4

## 2022-06-04 MED ORDER — IPRATROPIUM-ALBUTEROL 0.5-2.5 (3) MG/3ML IN SOLN
RESPIRATORY_TRACT | Status: AC
  Filled 2022-06-04: qty 3

## 2022-06-04 MED ORDER — SUCCINYLCHOLINE CHLORIDE 200 MG/10ML IV SOSY
PREFILLED_SYRINGE | INTRAVENOUS | Status: DC | PRN
  Administered 2022-06-04: 160 mg via INTRAVENOUS

## 2022-06-04 MED ORDER — ACETAMINOPHEN 650 MG RE SUPP
650.0000 mg | Freq: Four times a day (QID) | RECTAL | Status: DC | PRN
  Filled 2022-06-04: qty 1

## 2022-06-04 MED ORDER — ALBUTEROL SULFATE (2.5 MG/3ML) 0.083% IN NEBU
2.5000 mg | INHALATION_SOLUTION | Freq: Four times a day (QID) | RESPIRATORY_TRACT | Status: DC | PRN
  Administered 2022-06-04: 2.5 mg via RESPIRATORY_TRACT

## 2022-06-04 MED ORDER — FENTANYL CITRATE PF 50 MCG/ML IJ SOSY: 25.0000 ug | PREFILLED_SYRINGE | INTRAMUSCULAR | Status: DC | PRN

## 2022-06-04 MED ORDER — LIDOCAINE HCL (PF) 1 % IJ SOLN
INTRAMUSCULAR | Status: DC | PRN
  Administered 2022-06-04: 10 mL

## 2022-06-04 MED ORDER — FUROSEMIDE 10 MG/ML IJ SOLN
INTRAMUSCULAR | Status: AC
  Filled 2022-06-04: qty 2

## 2022-06-04 MED ORDER — SODIUM CHLORIDE 0.9% FLUSH: 3.0000 mL | INTRAVENOUS | Status: DC | PRN

## 2022-06-04 MED ORDER — LEVONORGESTREL 20 MCG/DAY IU IUD
1.0000 | INTRAUTERINE_SYSTEM | INTRAUTERINE | Status: AC
  Administered 2022-06-04: 1 via INTRAUTERINE
  Filled 2022-06-04: qty 1

## 2022-06-04 MED ORDER — FENTANYL CITRATE (PF) 100 MCG/2ML IJ SOLN
INTRAMUSCULAR | Status: DC | PRN
  Administered 2022-06-04: 100 ug via INTRAVENOUS

## 2022-06-04 MED ORDER — MAGNESIUM SULFATE 2 GM/50ML IV SOLN
2.0000 g | Freq: Once | INTRAVENOUS | Status: AC
  Administered 2022-06-04: 2 g via INTRAVENOUS
  Filled 2022-06-04: qty 50

## 2022-06-04 MED ORDER — ALBUTEROL SULFATE (2.5 MG/3ML) 0.083% IN NEBU
INHALATION_SOLUTION | RESPIRATORY_TRACT | Status: AC
  Administered 2022-06-05: 2.5 mg via RESPIRATORY_TRACT
  Filled 2022-06-04: qty 3

## 2022-06-04 MED ORDER — IPRATROPIUM-ALBUTEROL 0.5-2.5 (3) MG/3ML IN SOLN
3.0000 mL | Freq: Once | RESPIRATORY_TRACT | Status: AC
  Administered 2022-06-04: 3 mL via RESPIRATORY_TRACT

## 2022-06-04 MED ORDER — LIDOCAINE 2% (20 MG/ML) 5 ML SYRINGE
INTRAMUSCULAR | Status: DC | PRN
  Administered 2022-06-04: 100 mg via INTRAVENOUS

## 2022-06-04 MED ORDER — ROCURONIUM BROMIDE 10 MG/ML (PF) SYRINGE
PREFILLED_SYRINGE | INTRAVENOUS | Status: AC
  Filled 2022-06-04: qty 10

## 2022-06-04 MED ORDER — SUGAMMADEX SODIUM 500 MG/5ML IV SOLN
INTRAVENOUS | Status: AC
  Filled 2022-06-04: qty 5

## 2022-06-04 MED ORDER — LACTATED RINGERS IV SOLN: INTRAVENOUS | Status: DC

## 2022-06-04 MED ORDER — AMISULPRIDE (ANTIEMETIC) 5 MG/2ML IV SOLN
INTRAVENOUS | Status: AC
  Filled 2022-06-04: qty 4

## 2022-06-04 MED ORDER — PROPOFOL 500 MG/50ML IV EMUL
INTRAVENOUS | Status: AC
  Filled 2022-06-04: qty 50

## 2022-06-04 MED ORDER — DEXAMETHASONE SODIUM PHOSPHATE 10 MG/ML IJ SOLN
INTRAMUSCULAR | Status: AC
  Filled 2022-06-04: qty 1

## 2022-06-04 MED ORDER — CHLORHEXIDINE GLUCONATE 0.12 % MT SOLN
15.0000 mL | Freq: Once | OROMUCOSAL | Status: AC
  Administered 2022-06-04: 15 mL via OROMUCOSAL

## 2022-06-04 MED ORDER — ORAL CARE MOUTH RINSE: 15.0000 mL | Freq: Once | OROMUCOSAL | Status: AC

## 2022-06-04 MED ORDER — LIDOCAINE HCL (PF) 1 % IJ SOLN
INTRAMUSCULAR | Status: AC
  Filled 2022-06-04: qty 30

## 2022-06-04 MED ORDER — SODIUM CHLORIDE 0.9 % IV SOLN
250.0000 mL | INTRAVENOUS | Status: DC | PRN
  Administered 2022-06-04: 250 mL via INTRAVENOUS

## 2022-06-04 MED ORDER — ACETAMINOPHEN 500 MG PO TABS: 1000.0000 mg | ORAL_TABLET | Freq: Once | ORAL | Status: DC

## 2022-06-04 MED ORDER — AMISULPRIDE (ANTIEMETIC) 5 MG/2ML IV SOLN
10.0000 mg | Freq: Once | INTRAVENOUS | Status: AC | PRN
  Administered 2022-06-04: 10 mg via INTRAVENOUS

## 2022-06-04 MED ORDER — STERILE WATER FOR IRRIGATION IR SOLN
Status: DC | PRN
  Administered 2022-06-04: 1000 mL

## 2022-06-04 MED ORDER — IPRATROPIUM-ALBUTEROL 0.5-2.5 (3) MG/3ML IN SOLN: 3.0000 mL | Freq: Three times a day (TID) | RESPIRATORY_TRACT | Status: DC

## 2022-06-04 MED ORDER — LIDOCAINE HCL URETHRAL/MUCOSAL 2 % EX GEL
CUTANEOUS | Status: AC
  Filled 2022-06-04: qty 10

## 2022-06-04 MED ORDER — FENTANYL CITRATE (PF) 100 MCG/2ML IJ SOLN
INTRAMUSCULAR | Status: AC
  Filled 2022-06-04: qty 2

## 2022-06-04 MED ORDER — METHYLPREDNISOLONE SODIUM SUCC 125 MG IJ SOLR
INTRAMUSCULAR | Status: AC
  Administered 2022-06-05: 40 mg via INTRAVENOUS
  Filled 2022-06-04: qty 2

## 2022-06-04 MED ORDER — PROPOFOL 10 MG/ML IV BOLUS
INTRAVENOUS | Status: DC | PRN
  Administered 2022-06-04: 200 mg via INTRAVENOUS

## 2022-06-04 MED ORDER — METHYLPREDNISOLONE SODIUM SUCC 125 MG IJ SOLR: 80.0000 mg | INTRAMUSCULAR | Status: DC

## 2022-06-04 MED ORDER — SODIUM CHLORIDE 0.9% FLUSH
3.0000 mL | Freq: Two times a day (BID) | INTRAVENOUS | Status: DC
  Administered 2022-06-05 – 2022-06-08 (×6): 3 mL via INTRAVENOUS

## 2022-06-04 MED ORDER — INSULIN ASPART 100 UNIT/ML IJ SOLN
0.0000 [IU] | INTRAMUSCULAR | Status: DC
  Administered 2022-06-04 – 2022-06-05 (×2): 7 [IU] via SUBCUTANEOUS
  Administered 2022-06-05: 3 [IU] via SUBCUTANEOUS
  Administered 2022-06-05 (×2): 5 [IU] via SUBCUTANEOUS
  Administered 2022-06-06 (×2): 7 [IU] via SUBCUTANEOUS
  Administered 2022-06-06: 2 [IU] via SUBCUTANEOUS
  Administered 2022-06-06 (×2): 3 [IU] via SUBCUTANEOUS
  Administered 2022-06-06: 5 [IU] via SUBCUTANEOUS
  Administered 2022-06-07: 3 [IU] via SUBCUTANEOUS
  Administered 2022-06-07 (×2): 2 [IU] via SUBCUTANEOUS
  Administered 2022-06-07: 1 [IU] via SUBCUTANEOUS
  Administered 2022-06-07: 3 [IU] via SUBCUTANEOUS
  Administered 2022-06-07: 1 [IU] via SUBCUTANEOUS

## 2022-06-04 MED ORDER — ONDANSETRON HCL 4 MG/2ML IJ SOLN
INTRAMUSCULAR | Status: AC
  Filled 2022-06-04: qty 2

## 2022-06-04 MED ORDER — SCOPOLAMINE 1 MG/3DAYS TD PT72
1.0000 | MEDICATED_PATCH | TRANSDERMAL | Status: DC
  Administered 2022-06-04 – 2022-06-07 (×2): 1.5 mg via TRANSDERMAL
  Filled 2022-06-04 (×2): qty 1

## 2022-06-04 MED ORDER — METHYLPREDNISOLONE SODIUM SUCC 125 MG IJ SOLR
125.0000 mg | Freq: Once | INTRAMUSCULAR | Status: AC
  Administered 2022-06-04: 125 mg via INTRAVENOUS

## 2022-06-04 MED ORDER — ONDANSETRON HCL 4 MG/2ML IJ SOLN
INTRAMUSCULAR | Status: DC | PRN
  Administered 2022-06-04: 4 mg via INTRAVENOUS

## 2022-06-04 MED ORDER — FUROSEMIDE 10 MG/ML IJ SOLN
20.0000 mg | Freq: Once | INTRAMUSCULAR | Status: AC
  Administered 2022-06-04: 10 mg via INTRAVENOUS

## 2022-06-04 MED ORDER — IPRATROPIUM-ALBUTEROL 0.5-2.5 (3) MG/3ML IN SOLN: 3.0000 mL | Freq: Four times a day (QID) | RESPIRATORY_TRACT | Status: DC

## 2022-06-04 MED ORDER — ALBUTEROL SULFATE (2.5 MG/3ML) 0.083% IN NEBU
2.5000 mg | INHALATION_SOLUTION | RESPIRATORY_TRACT | Status: DC | PRN
  Administered 2022-06-05 – 2022-06-06 (×2): 2.5 mg via RESPIRATORY_TRACT
  Filled 2022-06-04 (×2): qty 3

## 2022-06-04 MED ORDER — LIDOCAINE HCL (PF) 2 % IJ SOLN
INTRAMUSCULAR | Status: AC
  Filled 2022-06-04: qty 5

## 2022-06-04 MED ORDER — DEXAMETHASONE SODIUM PHOSPHATE 10 MG/ML IJ SOLN
INTRAMUSCULAR | Status: DC | PRN
  Administered 2022-06-04: 10 mg via INTRAVENOUS

## 2022-06-04 MED ORDER — DEXAMETHASONE SODIUM PHOSPHATE 4 MG/ML IJ SOLN: 4.0000 mg | INTRAMUSCULAR | Status: AC

## 2022-06-04 SURGICAL SUPPLY — 31 items
BAG COUNTER SPONGE SURGICOUNT (BAG) ×1 IMPLANT
BAG SPNG CNTER NS LX DISP (BAG)
BLADE SURG 15 STRL LF DISP TIS (BLADE) IMPLANT
BLADE SURG 15 STRL SS (BLADE)
BLADE SURG SZ11 CARB STEEL (BLADE) IMPLANT
CATH ROBINSON RED A/P 16FR (CATHETERS) IMPLANT
DEVICE MYOSURE LITE (MISCELLANEOUS) IMPLANT
DEVICE MYOSURE REACH (MISCELLANEOUS) IMPLANT
DILATOR CANAL MILEX (MISCELLANEOUS) IMPLANT
GAUZE 4X4 16PLY ~~LOC~~+RFID DBL (SPONGE) ×1 IMPLANT
GLOVE BIO SURGEON STRL SZ 6 (GLOVE) ×2 IMPLANT
GLOVE BIO SURGEON STRL SZ 6.5 (GLOVE) IMPLANT
GOWN STRL REUS W/ TWL LRG LVL3 (GOWN DISPOSABLE) ×1 IMPLANT
GOWN STRL REUS W/TWL LRG LVL3 (GOWN DISPOSABLE) ×1
IV NS IRRIG 3000ML ARTHROMATIC (IV SOLUTION) ×1 IMPLANT
KIT PROCEDURE FLUENT (KITS) IMPLANT
KIT TURNOVER KIT A (KITS) ×1 IMPLANT
LOOP CUTTING BIPOLAR 21FR (ELECTRODE) IMPLANT
MYOSURE XL FIBROID (MISCELLANEOUS)
Mirena IMPLANT
NDL HYPO 25X1 1.5 SAFETY (NEEDLE) IMPLANT
NEEDLE HYPO 25X1 1.5 SAFETY (NEEDLE) IMPLANT
PACK PERINEAL COLD (PAD) ×1 IMPLANT
PACK VAGINAL WOMENS (CUSTOM PROCEDURE TRAY) ×1 IMPLANT
PAD OB MATERNITY 4.3X12.25 (PERSONAL CARE ITEMS) IMPLANT
SEAL ROD LENS SCOPE MYOSURE (ABLATOR) IMPLANT
SYR BULB IRRIG 60ML STRL (SYRINGE) ×1 IMPLANT
SYSTEM TISS REMOVAL MYOSURE XL (MISCELLANEOUS) IMPLANT
TOWEL OR 17X26 10 PK STRL BLUE (TOWEL DISPOSABLE) ×2 IMPLANT
WATER STERILE IRR 1000ML POUR (IV SOLUTION) ×1 IMPLANT
WATER STERILE IRR 500ML POUR (IV SOLUTION) ×1 IMPLANT

## 2022-06-04 NOTE — Anesthesia Postprocedure Evaluation (Signed)
Anesthesia Post Note  Patient: Deborah Benson  Procedure(s) Performed: PELVIC EXAM UNDER ANESTHESIA DILATATION AND CURETTAGE MIRENA INTRAUTERINE DEVICE (IUD) INSERTION     Patient location during evaluation: PACU Anesthesia Type: General Level of consciousness: awake and alert Pain management: pain level controlled Vital Signs Assessment: post-procedure vital signs reviewed and stable Respiratory status: patient connected to nasal cannula oxygen Cardiovascular status: stable Postop Assessment: no apparent nausea or vomiting Anesthetic complications: no Comments: Due to the patients BMI of 72, recent (4-6 weeks ago) pneumonia and asthma and in light of she lives alone it is has been decided to admit the patient for 23 hour observation. Although the CXR shows no acute air space disease she has responded with an increased SAO2 afer '10mg'$  of Lasix IV.  No notable events documented.  Last Vitals:  Vitals:   06/04/22 1730 06/04/22 1745  BP: (!) 140/80 (!) 147/94  Pulse: 70 63  Resp: 11 10  Temp:    SpO2: 96% 96%    Last Pain:  Vitals:   06/04/22 1745  TempSrc:   PainSc: 0-No pain                 Huston Foley

## 2022-06-04 NOTE — Assessment & Plan Note (Signed)
Will need nutrition consult as an outpt

## 2022-06-04 NOTE — Assessment & Plan Note (Signed)
In the setting of vaginal bleeding  Obtain anemia panel  Transfuse for HG dropping rapidly or < 7

## 2022-06-04 NOTE — Assessment & Plan Note (Signed)
-   Spoke about importance of quitting spent 5 minutes discussing options for treatment, prior attempts at quitting, and dangers of smoking  -At this point patient is    interested in quitting  - does not want nicotine patch   - nursing tobacco cessation protocol

## 2022-06-04 NOTE — Op Note (Signed)
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 Uterus unable to be palpated due to body habitus. Few non-draining nodules on the vulva and mons that appear consistent with possible hidradenitis suppurativa. Uterus sounds to 9.5cm. Abundant tissue noted on curettage. Gritty texture in all quadrants at completion. Given ongoing respiratory congestion, decision made to defer trendelenburg assessment today.   Specimens:  ID Type Source Tests Collected by Time Destination  1 : Endometrial Curettings Tissue PATH Soft tissue resection SURGICAL PATHOLOGY NBernadene Bell MD [id number]    Complications:  None  Indications for Procedure: MAlmetta Benson a 61y.o. woman with EIN on biopsy. Due to morbid obesity, decision made to proceed with D&C and IUD insertion.  Prior to the procedure, all risks, benefits, and alternatives were discussed and informed surgical consent was signed.  Procedure: Patient was taken to the operating room where general anesthesia was achieved.  She was positioned in dorsal lithotomy and prepped and draped in sterile fashion.  An in and out catheter was inserted into the bladder.  A speculum was inserted into the vagina. A tenaculum was placed on the anterior lip of the cervix. A cervical block was placed at 4 and 8 o'clock with lidocaine. The cervix was dilated serially using pratt dilators. An endometrial curette was inserted into the uterus. Abundant tissue was removed with curetting until a gritty texture was noted in all quadrants. A mirena  intauterine device was then inserted into the uterus without difficulty. The strings were trimmed to 3cm. The tenaculum was removed and sites noted to be hemostatic. The speculum was removed.  Patient tolerated the procedure well. Sponge, lap, and instrument counts were correct.  No perioperative antibiotics were indicated for this procedure.  Patient was extubated and taken to the PACU in stable condition.  MBernadene Bell MD Gynecologic Oncology

## 2022-06-04 NOTE — Discharge Instructions (Signed)
06/04/2022   Activity: 1. Be up and out of the bed during the day.  Take a nap if needed.  You may walk up steps but be careful and use the hand rail.  Stair climbing will tire you more than you think, you may need to stop part way and rest.   2. Do not drive if you are taking narcotic pain medicine. You need to make sure your reaction time has returned and you can brake safely.  3. Shower daily.  Use your regular soap to bathe and when finished pat your incision dry; don't rub.  No tub baths until cleared by your surgeon.   4. No sexual activity and nothing in the vagina for 2 weeks.  5. You may experience a small amount of clear drainage from your incisions, which is normal.  If the drainage persists or increases, please call the office.  6. You may experience vaginal spotting after surgery or around the 6-8 week mark from surgery when the stitches at the top of the vagina begin to dissolve.  The spotting is normal but if you experience heavy bleeding, call our office.  7. Take Tylenol or ibuprofen first for pain and only use narcotic pain medication for severe pain not relieved by the Tylenol or Ibuprofen.  Monitor your Tylenol intake to a max of 4,000 mg.   Diet: 1. Low sodium Heart Healthy Diet is recommended.  2. It is safe to use a laxative, such as Miralax or Colace, if you have difficulty moving your bowels. You can take Sennakot at bedtime every evening to keep bowel movements regular and to prevent constipation.    Wound Care: 1. Keep clean and dry.  Shower daily.  Reasons to call the Doctor: Fever - Oral temperature greater than 100.4 degrees Fahrenheit Foul-smelling vaginal discharge Difficulty urinating Nausea and vomiting Increased pain at the site of the incision that is unrelieved with pain medicine. Difficulty breathing with or without chest pain New calf pain especially if only on one side Sudden, continuing increased vaginal bleeding with or without clots.    Contacts: For questions or concerns you should contact:  Dr. Ernestina Patches at [place], NP at [phone]  After Hours: call [phone] and have the GYN Oncologist paged/contacted

## 2022-06-04 NOTE — Assessment & Plan Note (Signed)
Treatment as per OB/GYN

## 2022-06-04 NOTE — Subjective & Objective (Signed)
Pt with suspected endometrial lesion has under gone DIC Has hx of Asthma  Has been having wheezing and shortness of breath for the past few weeks  Post OP could not wean off o2  She not on O2 at baseline  Has hx of morbid obesity and DM 2

## 2022-06-04 NOTE — Interval H&P Note (Signed)
History and Physical Interval Note:  06/04/2022 1:13 PM  Deborah Benson  has presented today for surgery, with the diagnosis of ENDOMETRIAL INTRAEPITHELIAL NEOPLASIA.  The various methods of treatment have been discussed with the patient and family. After consideration of risks, benefits and other options for treatment, the patient has consented to  Procedure(s): PELVIC EXAM UNDER ANESTHESIA (N/A) DILATATION AND CURETTAGE /HYSTEROSCOPY (N/A) MIRENA INTRAUTERINE DEVICE (IUD) INSERTION (N/A) as a surgical intervention.  The patient's history has been reviewed, patient examined, no change in status, stable for surgery.  I have reviewed the patient's chart and labs.  Questions were answered to the patient's satisfaction.     [name]

## 2022-06-04 NOTE — Anesthesia Procedure Notes (Addendum)
Procedure Name: Intubation Date/Time: 06/04/2022 1:58 PM  Performed by: Sharlette Dense, CRNAPre-anesthesia Checklist: Patient identified, Emergency Drugs available, Suction available and Patient being monitored Patient Re-evaluated:Patient Re-evaluated prior to induction Oxygen Delivery Method: Circle system utilized Preoxygenation: Pre-oxygenation with 100% oxygen Induction Type: IV induction Ventilation: Mask ventilation without difficulty and Oral airway inserted - appropriate to patient size Laryngoscope Size: Miller and 2 Tube type: Oral Tube size: 7.5 mm Number of attempts: 1 Airway Equipment and Method: Stylet, Oral airway and Patient positioned with wedge pillow Placement Confirmation: ETT inserted through vocal cords under direct vision, positive ETCO2 and breath sounds checked- equal and bilateral Secured at: 22 cm Tube secured with: Tape Dental Injury: Teeth and Oropharynx as per pre-operative assessment

## 2022-06-04 NOTE — Assessment & Plan Note (Signed)
this patient has acute respiratory failure with Hypoxia and  Hypercarbia as documented by the presence of following: O2 saturatio< 90% on RA 5 with pCO2 >50  Likely due to:   COPD exacerbation,  Provide O2 therapy and titrate as needed  Continuous pulse ox   check Pulse ox with ambulation prior to discharge   may need  TC consult for home O2 set up    flutter valve ordered

## 2022-06-04 NOTE — Significant Event (Signed)
S: Notified by PACU RN that patient has been unable to wean O2 to room air following procedure. Have worked on deep breathing, coughing, incentive spirometer and albuterol nebulizer treatment. CTSP. Pt reports that she feels short of breath that is temporarily relieved by the albuterol treatment. She reports that she believes she may have had pneumonia about 6 weeks ago but also notes that she believes she has viral infections frequently due to being around her grandchildren. No acute changes prior to the procedure today.  O: BP (!) 147/83 (BP Location: Left Wrist)   Pulse 67   Temp 98.1 F (36.7 C)   Resp 13   Ht 5' 3.5" (1.613 m)   Wt (!) 423 lb (191.9 kg)   SpO2 95%   BMI 73.76 kg/m  Gen: Pt overall comfortable appearing at rest CV: regular rate and rhythm Pulm: Occasional coughing. Sats in the upper 90s on 6L at rest. Occasional dips to the 80s with talking or coughing. Diffuse rhonchi in R>L.   A/P: Pt is POD#0 for a D&C and IUD insertion for endometrial intraepithelial neoplasia. Pt with ongoing respiratory needs postoperatively that are likely multifactorial in nature in the setting of patient's morbid obesity (BMI 73), poorly controlled asthma at baseline, possible recent pneumonia 6 weeks ago, and recent endotracheal intubation for her procedure.   When patient was seen in clinic on 12/4, pulmonary exam was very consistent with exam today, so although patient has exacerbation postoperatively, do not feel that this is far from expected based on her baseline.   CXR performed without acute finding. IV lasix ordered by anesthesia. One dose of dueoneb ordered.  Have discussed case with Hospitalist. ABG ordered and admission respiratory panel ordered (COVID, RSV, etc.). Hospitalist MD will see patient and help to get her admitted, likely to a step down floor given O2 needs and need for bi-pap.

## 2022-06-04 NOTE — H&P (Signed)
Deborah Benson HBZ:[id number] DOB: Apr 04, 1961 DOA: 06/04/2022     PCP: Lind Covert, MD   Outpatient Specialists:  NONE    Patient arrived to ER on  at  Referred by Attending Toy Baker, MD   Patient coming from:    home Lives  With family    Chief Complaint: hypoxia   HPI: Deborah Benson is a 61 y.o. female with medical history significant of DM2, obesity, asthma  Presented with  post operative hypoxia Pt with suspected endometrial lesion has under gone DIC Has hx of Asthma  Has been having wheezing and shortness of breath for the past few weeks  Post OP could not wean off o2  She not on O2 at baseline  Has hx of morbid obesity and DM 2   Pt smokes but is interested in quitting Does not drink  Does not use Oxygen or CPAP at baseline   Initial COVID TEST  NEGATIVE   Lab Results  Component Value Date   [place] NEGATIVE 06/04/2022     Regarding pertinent Chronic problems:         DM 2 -  Lab Results  Component Value Date   HGBA1C 7.8 (H) 03/26/5101   on  Trulicity and metformin      Morbid obesity-   BMI Readings from Last 1 Encounters:  06/04/22 73.76 kg/m       Asthma - not controlled on home inhalers/ nebs f             Chronic anemia - baseline hg Hemoglobin & Hematocrit  Recent Labs    04/22/22 1135 05/22/22 1344 06/04/22 1854  HGB 11.5 10.4* 10.5*       CXR - No acute intrathoracic process.    Following Medications were ordered in ER: Medications  dexamethasone (DECADRON) injection 4 mg (has no administration in time range)  scopolamine (TRANSDERM-SCOP) 1 MG/3DAYS 1.5 mg (1.5 mg Transdermal Patch Applied 06/04/22 1114)  fentaNYL (SUBLIMAZE) injection 25-50 mcg (has no administration in time range)  promethazine (PHENERGAN) injection 6.25-12.5 mg (has no administration in time range)  lactated ringers infusion ( Intravenous Anesthesia Volume Adjustment 06/04/22 1451)  lidocaine (PF) (XYLOCAINE) 1 % injection (10  mLs Infiltration Given 06/04/22 1428)  sterile water for irrigation for irrigation (1,000 mLs Irrigation Given 06/04/22 1428)  amisulpride (BARHEMSYS) 5 MG/2ML injection (has no administration in time range)  albuterol (PROVENTIL) (2.5 MG/3ML) 0.083% nebulizer solution 2.5 mg (2.5 mg Nebulization Given 06/04/22 1518)  albuterol (PROVENTIL) (2.5 MG/3ML) 0.083% nebulizer solution (has no administration in time range)  ipratropium-albuterol (DUONEB) 0.5-2.5 (3) MG/3ML nebulizer solution (has no administration in time range)  furosemide (LASIX) 10 MG/ML injection (has no administration in time range)  methylPREDNISolone sodium succinate (SOLU-MEDROL) 125 mg/2 mL injection (has no administration in time range)  insulin aspart (novoLOG) injection 0-9 Units (has no administration in time range)  levonorgestrel (MIRENA) 20 MCG/DAY IUD 1 each (1 each Intrauterine Given 06/04/22 1420)  acetaminophen (TYLENOL) tablet 1,000 mg (1,000 mg Oral Given 06/04/22 1113)  amisulpride (BARHEMSYS) injection 10 mg (10 mg Intravenous Given 06/04/22 1506)  chlorhexidine (PERIDEX) 0.12 % solution 15 mL (15 mLs Mouth/Throat Given 06/04/22 1114)    Or  Oral care mouth rinse ( Mouth Rinse See Alternative 06/04/22 1114)  ipratropium-albuterol (DUONEB) 0.5-2.5 (3) MG/3ML nebulizer solution 3 mL (3 mLs Nebulization Given 06/04/22 1636)  furosemide (LASIX) injection 20 mg (10 mg Intravenous Given 06/04/22 1711)  methylPREDNISolone sodium succinate (SOLU-MEDROL) 125 mg/2 mL injection 125 mg (  125 mg Intravenous Given 06/04/22 1714)     ED Triage Vitals  Enc Vitals Group     BP 06/04/22 1052 (!) 158/78     Pulse Rate 06/04/22 1052 74     Resp 06/04/22 1052 20     Temp 06/04/22 1052 98.5 F (36.9 C)     Temp Source 06/04/22 1052 Oral     SpO2 06/04/22 1052 94 %     Weight 06/04/22 1047 (!) 423 lb (191.9 kg)     Height 06/04/22 1047 5' 3.5" (1.613 m)     Head Circumference --      Peak Flow --      Pain Score 06/04/22 1113  0     Pain Loc --      Pain Edu? --      Excl. in [place]? --   TMAX(24)@     _________________________________________ Significant initial  Findings: Abnormal Labs Reviewed  GLUCOSE, CAPILLARY - Abnormal; Notable for the following components:      Result Value   Glucose-Capillary 177 (*)    All other components within normal limits  GLUCOSE, CAPILLARY - Abnormal; Notable for the following components:   Glucose-Capillary 146 (*)    All other components within normal limits  BLOOD GAS, ARTERIAL - Abnormal; Notable for the following components:   pH, Arterial 7.34 (*)    pCO2 arterial 59 (*)    pO2, Arterial 75 (*)    Bicarbonate 31.8 (*)    Acid-Base Excess 4.3 (*)    All other components within normal limits  CBC WITH DIFFERENTIAL/PLATELET - Abnormal; Notable for the following components:   WBC 11.4 (*)    Hemoglobin 10.5 (*)    MCH 23.5 (*)    MCHC 28.5 (*)    RDW 19.7 (*)    Neutro Abs 10.7 (*)    Lymphs Abs 0.6 (*)    All other components within normal limits     _________________________ Troponin  ordered ECG: Ordered Personally reviewed and interpreted by me showing: HR : 67 Rhythm:Normal sinus rhythm Normal ECG QTC 412    The recent clinical data is shown below. Vitals:   06/04/22 1715 06/04/22 1730 06/04/22 1745 06/04/22 1800  BP: (!) 153/79 (!) 140/80 (!) 147/94 (!) 147/83  Pulse: 72 70 63 67  Resp: '10 11 10 13  '$ Temp:      TempSrc:      SpO2: 95% 96% 96% 95%  Weight:      Height:        WBC     Component Value Date/Time   WBC 11.4 (H) 06/04/2022 1854   LYMPHSABS 0.6 (L) 06/04/2022 1854   MONOABS 0.1 06/04/2022 1854   EOSABS 0.0 06/04/2022 1854   BASOSABS 0.0 06/04/2022 1854      Lactic Acid, Venous    Component Value Date/Time   LATICACIDVEN 1.6 06/04/2022 1854     UA  ordered   Urine analysis:    Component Value Date/Time   COLORURINE YELLOW 03/02/2008 [place] 03/02/2008 1624   LABSPEC 1.006 03/02/2008 1624    PHURINE 7.5 03/02/2008 1624   GLUCOSEU NEGATIVE 03/02/2008 1624   HGBUR NEGATIVE 03/02/2008 1624   BILIRUBINUR NEGATIVE 03/02/2008 1624   KETONESUR NEGATIVE 03/02/2008 1624   PROTEINUR NEGATIVE 03/02/2008 1624   UROBILINOGEN 0.2 03/02/2008 1624   NITRITE NEGATIVE 03/02/2008 1624   LEUKOCYTESUR  03/02/2008 1624    NEGATIVE MICROSCOPIC NOT DONE ON URINES WITH NEGATIVE PROTEIN, BLOOD, LEUKOCYTES, NITRITE, OR  GLUCOSE <1000 mg/dL.    Results for orders placed or performed in visit on 07/03/21  COVID-19, Flu A+B and RSV     Status: None   Collection Time: 07/03/21  4:27 PM   Specimen: Nasal Swab  Result Value Ref Range Status   SARS-CoV-2, NAA Not Detected Not Detected Final   Influenza A, NAA Not Detected Not Detected Final   Influenza B, NAA Not Detected Not Detected Final   RSV, NAA Not Detected Not Detected Final   Test Information: Comment  Final          _______________________________________________ Hospitalist was called for admission for  acute  respiratory failure with hypoxia and hypercarbia   EIN (endometrial intraepithelial neoplasia)     Moderate persistent asthma/COPD with acute exacerbation      The following Work up has been ordered so far:  Orders Placed This Encounter  Procedures   Resp panel by RT-PCR (RSV, Flu A&B, Covid) Anterior Nasal Swab   X-ray chest PA or AP   Glucose, capillary   Glucose, capillary   Blood gas, arterial   CBC with Differential/Platelet   Comprehensive metabolic panel   CK   Lactic acid, plasma   Magnesium   Phosphorus   TSH   Informed Consent Details: Physician/Practitioner Attestation; Transcribe to consent form and obtain patient signature   Initiate Pre-op Protocol   Pre-admission testing diagnosis   Anesthesia per Enhanced Recovery Pathway   Anesthesia per Enhanced Recovery Pathway   Provide patient education materials   SCD's   Outpatient / Same Day Surgery   Diet per anesthesia ENHANCED RECOVERY AFTER SURGERY (ERAS)  Pathway.  Clear liquids after midnight until 3 hours before surgery. NPO except meds with a sip of water starting 3 hours before surgery.   Lab per anesthesia   Chest X-ray per anesthesia   EKG per anesthesia   If unable to be discharged at 6 hours after surgery, Notify MD/PA   Assess for discharge every 2 hours after surgery.   Discharge  per PACU criteria   Vital signs   Cardiac monitoring   Notify physician (specify)   Vital signs Per Protocol   Notify physician (specify)   Diet Per Protocol   Pre-admission testing diagnosis   Medication Instructions Per Protocol   Discharge instructions   Void   IV Fluids Per Protocol   May use Lidocaine 1% - 2% subcutaneously prior to IV start if patient not allergic to Lidocaine   Follow Diabetes Medication Adjustment Guidelines Prior to Procedure and Surgery   Anesthesia Preoperative Order   Cardiac Monitoring - Continuous Indefinite   Apply Diabetes Mellitus Care Plan   STAT CBG when hypoglycemia is suspected. If treated, recheck every 15 minutes after each treatment until CBG >/= 70 mg/dl   Refer to Hypoglycemia Protocol Sidebar Report for treatment of CBG < 70 mg/dl   Full code   Airborne and Contact precautions   Pulse oximetry, continuous   Oxygen therapy Mode or (Route): Nasal cannula; FiO2: OTHER SEE COMMENTS; Liters Per Minute: 2; Keep 02 saturation: greater than or equal to 92 %   Continuous pulse oximetry   Oxygen therapy Mode or (Route): Nasal cannula; Keep 02 saturation: > 92%   EKG 12-Lead   Insert and maintain IV line   Place in observation (patient's expected length of stay will be less than 2 midnights)   Discharge patient Discharge disposition: 01-Home or Self Care; Discharge patient date: 06/04/2022     OTHER Significant initial  Findings:  labs showing:    Recent Labs  Lab 06/04/22 1854  NA 135  K 5.0  CO2 27  GLUCOSE 197*  BUN 8  CREATININE 0.82  CALCIUM 8.3*  MG 2.3  PHOS 3.6    Cr  stable,    Lab  Results  Component Value Date   CREATININE 0.82 06/04/2022   CREATININE 0.65 05/22/2022   CREATININE 0.71 01/02/2022    Recent Labs  Lab 06/04/22 1854  AST 22  ALT 17  ALKPHOS 75  BILITOT 0.5  PROT 9.0*  ALBUMIN 3.3*   Lab Results  Component Value Date   CALCIUM 8.8 (L) 05/22/2022        Plt: Lab Results  Component Value Date   PLT 267 06/04/2022     ABG    Component Value Date/Time   PHART 7.34 (L) 06/04/2022 1837   PCO2ART 59 (H) 06/04/2022 1837   PO2ART 75 (L) 06/04/2022 1837   HCO3 31.8 (H) 06/04/2022 1837   O2SAT 96.4 06/04/2022 1837       Recent Labs  Lab 06/04/22 1854  WBC 11.4*  NEUTROABS 10.7*  HGB 10.5*  HCT 36.8  MCV 82.3  PLT 267    HG/HCT   stable,     Component Value Date/Time   HGB 10.5 (L) 06/04/2022 1854   HGB 11.5 04/22/2022 1135   HCT 36.8 06/04/2022 1854   HCT 38.7 04/22/2022 1135   MCV 82.3 06/04/2022 1854   MCV 78 (L) 04/22/2022 1135     Cardiac Panel (last 3 results) Recent Labs    06/04/22 1854  CKTOTAL 128    .car BNP (last 3 results) No results for input(s): "BNP" in the last 8760 hours.    DM  labs:  HbA1C: Recent Labs    06/12/21 1459 01/02/22 1128 05/13/22 1244  HGBA1C 7.2* 8.0* 7.8*      CBG (last 3)  Recent Labs    06/04/22 1058 06/04/22 1456  GLUCAP 177* 146*        Cultures: No results found for: "SDES", "SPECREQUEST", "CULT", "REPTSTATUS"   Radiological Exams on Admission: X-ray chest PA or AP  Result Date: 06/04/2022 CLINICAL DATA:  Hypoxia EXAM: CHEST  1 VIEW COMPARISON:  07/17/2021 FINDINGS: Single frontal view of the chest demonstrates a stable cardiac silhouette. No acute airspace disease, effusion, or pneumothorax. No acute bony abnormalities. IMPRESSION: 1. No acute intrathoracic process. Electronically Signed   By: Randa Ngo M.D.   On: 06/04/2022 17:16   _______________________________________________________________________________________________________ Latest  Blood  pressure (!) 147/83, pulse 67, temperature 98.1 F (36.7 C), resp. rate 13, height 5' 3.5" (1.613 m), weight (!) 191.9 kg, SpO2 95 %.   Vitals  labs and radiology finding personally reviewed  Review of Systems:    Pertinent positives include:   fatigue, shortness of breath at rest.  dyspnea on exertion Constitutional:  No weight loss, night sweats, Fevers, chills, weight loss  HEENT:  No headaches, Difficulty swallowing,Tooth/dental problems,Sore throat,  No sneezing, itching, ear ache, nasal congestion, post nasal drip,  Cardio-vascular:  No chest pain, Orthopnea, PND, anasarca, dizziness, palpitations.no Bilateral lower extremity swelling  GI:  No heartburn, indigestion, abdominal pain, nausea, vomiting, diarrhea, change in bowel habits, loss of appetite, melena, blood in stool, hematemesis Resp:  no , No excess mucus, no productive cough, No non-productive cough, No coughing up of blood.No change in color of mucus.No wheezing. Skin:  no rash or lesions. No jaundice GU:  no dysuria, change in color of  urine, no urgency or frequency. No straining to urinate.  No flank pain.  Musculoskeletal:  No joint pain or no joint swelling. No decreased range of motion. No back pain.  Psych:  No change in mood or affect. No depression or anxiety. No memory loss.  Neuro: no localizing neurological complaints, no tingling, no weakness, no double vision, no gait abnormality, no slurred speech, no confusion  All systems reviewed and apart from [place] all are negative _______________________________________________________________________________________________ Past Medical History:   Past Medical History:  Diagnosis Date   Arthritis    Asthma    Cancer ([place])    ovarian cancer   Depression    Diabetes mellitus without complication ([place])    oral meds only   Gout    HTN (hypertension)    "borderline" med was stopped by pt- MD aware.   Neuromuscular disorder (HCC)    neuropathy -hands/ feet    Neuropathy    Obesity    BMI >76.6   Pneumonia      Past Surgical History:  Procedure Laterality Date   FOOT SURGERY     due to needle break in foot   MULTIPLE TOOTH EXTRACTIONS     TONSILLECTOMY      Social History:  Ambulatory   independently      reports that she has been smoking cigarettes. She started smoking about 44 years ago. She has been smoking an average of .4 packs per day. She has never used smokeless tobacco. She reports that she does not drink alcohol and does not use drugs.   Family History:   Family History  Problem Relation Age of Onset   Heart attack Mother    Stroke Mother    Gout Mother    Hypertension Mother    Lung cancer Father    Glaucoma Father    Heart disease Sister        fluid    Other Sister        spinal disease   Diabetes Brother    Cancer - Ovarian Maternal Grandmother        declined treatment   Dementia Maternal Grandfather    Glaucoma Other        nephew   Seizures Other        niece   Colon cancer Neg Hx    Breast cancer Neg Hx    Endometrial cancer Neg Hx    Prostate cancer Neg Hx    Pancreatic cancer Neg Hx    ______________________________________________________________________________________________ Allergies: Allergies  Allergen Reactions   Penicillins Hives    Denies airway involvement    Prior to Admission medications   Medication Sig Start Date End Date Taking? Authorizing Provider  albuterol (PROVENTIL) (2.5 MG/3ML) 0.083% nebulizer solution Take 3 mLs (2.5 mg total) by nebulization every 6 (six) hours as needed for wheezing or shortness of breath. 07/25/21  Yes Hensel, Jamal Collin, MD  gabapentin (NEURONTIN) 100 MG capsule Take 1 capsule (100 mg total) by mouth 3 (three) times daily. Patient taking differently: Take 100 mg by mouth 2 (two) times daily. 01/02/22  Yes Lind Covert, MD  medroxyPROGESTERone (PROVERA) 10 MG tablet Take 1 tablet (10 mg total) by mouth in the morning and at bedtime.  04/19/22  Yes Megan Salon, MD  metFORMIN (GLUCOPHAGE-XR) 500 MG 24 hr tablet TAKE 1 TABLET(500 MG) BY MOUTH IN THE MORNING AND AT BEDTIME Patient taking differently: Take 500 mg by mouth daily. 04/08/22  Yes Lind Covert, MD  naproxen sodium (ALEVE) 220 MG tablet Take 440 mg by mouth at bedtime as needed (pain).   Yes [provider]  nystatin (MYCOSTATIN/NYSTOP) powder Apply 1 Application topically 3 (three) times daily as needed. Patient taking differently: Apply 1 Application topically daily. 04/09/22  Yes Leftwich-Kirby, Kathie Dike, CNM  PROAIR HFA 108 (90 Base) MCG/ACT inhaler INHALE 2 PUFFS BY MOUTH EVERY 6 HOURS AS NEEDED FOR SHORTNESS OF BREATH. PLEASE MAKE AN APPOINTMENT 07/13/21  Yes Welborn, Ryan, DO  TRULICITY 7.78 EU/2.3NT SOPN ADMINISTER 0.75 MG UNDER THE SKIN 1 TIME A WEEK 05/13/22  Yes Chambliss, Jeb Levering, MD  metroNIDAZOLE (FLAGYL) 500 MG tablet Take 1 tablet (500 mg total) by mouth 2 (two) times daily. Patient not taking: Reported on 05/17/2022 04/24/22   Megan Salon, MD    ___________________________________________________________________________________________________ Physical Exam:    06/04/2022    6:00 PM 06/04/2022    5:45 PM 06/04/2022    5:30 PM  Vitals with BMI  Systolic 614 431 540  Diastolic 83 94 80  Pulse 67 63 70     1. General:  in No  Acute distress   Chronically ill  -appearing 2. Psychological: Alert and   Oriented 3. Head/ENT:   Moist   Mucous Membranes                          Head Non traumatic, neck supple                          Normal   Dentition 4. SKIN: normal   Skin turgor,  Skin clean Dry and intact no rash 5. Heart: Regular rate and rhythm no  Murmur, no Rub or gallop 6. Lungs: some  wheezes no crackles  upper airway noises too 7. Abdomen: Soft,  non-tender, Non distended   obese  bowel sounds present 8. Lower extremities: no clubbing, cyanosis, no  edema 9. Neurologically Grossly intact, moving all 4 extremities  equally   10. MSK: Normal range of motion    Chart has been reviewed  ______________________________________________________________________________________________  Assessment/Plan 61 y.o. female with medical history significant of DM2, obesity, asthma   Admitted for acute  respiratory failure with hypoxia and hypercarbia Moderate persistent asthma/COPD with acute exacerbation   Present on Admission:  Asthma exacerbation  COPD with acute exacerbation (HCC)  TOBACCO USE  EIN (endometrial intraepithelial neoplasia)  Acute respiratory failure with hypoxia and hypercapnia (HCC)  Anemia     COPD with acute exacerbation (HCC)  -  - Will initiate: Steroid taper  -  Antibiotics   Doxycycline, - Albuterol  PRN, - scheduled duoneb,  -  Breo or Dulera at discharge   -  Mucinex.  Titrate O2 to saturation >90%. Follow patients respiratory status.  influenza PCR neg  ABG showing hypercarbia. hypoxia   BiPAP ordered PRN for increased work of breathing.  Currently mentating well no evidence of symptomatic hypercarbia   TOBACCO USE  - Spoke about importance of quitting spent 5 minutes discussing options for treatment, prior attempts at quitting, and dangers of smoking  -At this point patient is    interested in quitting  - does not want nicotine patch   - nursing tobacco cessation protocol   BMI 70 and over, adult [facility]) Will need nutrition consult as an outpt  EIN (endometrial intraepithelial neoplasia) Treatment as per OB/GYN  Acute respiratory failure with hypoxia and hypercapnia ([place])  this patient has  acute respiratory failure with Hypoxia and  Hypercarbia as documented by the presence of following: O2 saturatio< 90% on RA 5 with pCO2 >50  Likely due to:   COPD exacerbation,  Provide O2 therapy and titrate as needed  Continuous pulse ox   check Pulse ox with ambulation prior to discharge   may need  TC consult for home O2 set up    flutter valve ordered   DM2  (diabetes mellitus, type 2) (HCC)  - Order Sensitive  SSI     -  check TSH and HgA1C  - Hold by mouth medications    Anemia In the setting of vaginal bleeding  Obtain anemia panel  Transfuse for HG dropping rapidly or < 7   Other plan as per orders.  DVT prophylaxis:    SCD's Start: 06/04/22 1031    Code Status: DNR/DNI   as per patient  states she is very worried that her family will leave her on LIfe support indefinatly  Rec having dedicated MPOA Pt at this time  I had personally discussed CODE STATUS with patient       Family Communication:   Family not at  Bedside    Disposition Plan:      To home once workup is complete and patient is stable   Following barriers for discharge:                                                        Anemia stable                                                        Will likely need home health, home O2, set up                           Will need consultants to evaluate patient prior to discharge                      Palliative care    consulted                                   Consults called: sent msg to PCCM   Admission status:  ED Disposition     None           inpatient     I Expect 2 midnight stay secondary to severity of patient's current illness need for inpatient interventions justified by the following:  hemodynamic instability despite optimal treatment ( hypoxia, hypercapnia)   and extensive comorbidities including:  DM2   COPD/asthma  Morbid Obesity   That are currently affecting medical management.   I expect  patient to be hospitalized for 2 midnights requiring inpatient medical care.  Patient is at high risk for adverse outcome (such as loss of life or disability) if not treated.  Indication for inpatient stay as follows:    Hemodynamic instability despite maximal medical therapy,    New or worsening hypoxia    Need for   need for biPAP  Level of care     progressive tele indefinitely please  discontinue once patient no longer qualifies COVID-19 Labs   No results found for: "SARSCOV2NAA"   Precautions: admitted as   Covid Negative    Deborah Benson 06/04/2022, 8:50 PM    [facility]     after 2 AM please page floor coverage PA If 7AM-7PM, please contact the day team taking care of the patient using [email or web address]   Patient was evaluated in the context of the global COVID-19 pandemic, which necessitated consideration that the patient might be at risk for infection with the SARS-CoV-2 virus that causes COVID-19. Institutional protocols and algorithms that pertain to the evaluation of patients at risk for COVID-19 are in a state of rapid change based on information released by regulatory bodies including the CDC and federal and state organizations. These policies and algorithms were followed during the patient's care.

## 2022-06-04 NOTE — Assessment & Plan Note (Signed)
-  -   Will initiate: Steroid taper  -  Antibiotics   Doxycycline, - Albuterol  PRN, - scheduled duoneb,  -  Breo or Dulera at discharge   -  Mucinex.  Titrate O2 to saturation >90%. Follow patients respiratory status.  influenza PCR neg  ABG showing hypercarbia. hypoxia   BiPAP ordered PRN for increased work of breathing.  Currently mentating well no evidence of symptomatic hypercarbia

## 2022-06-04 NOTE — Progress Notes (Signed)
Patient in recovery unable to wean off oxygen. Patient stated felt tight in the chest.  Was ordered albuterol neb. O2 sats on 5L [place] was 89%  Patient felt a little better after.  Patient did cough and deep breath exercises and IS. Maintained O2 91-95%. Weaned to RA and O2 dropped to 86%. Placed back on 4L [place]. Tried again after a while and same thing happened.  Patient stated she was starting to feel tight in the chest again. Notified Dr Ernestina Patches. Ordered duoneb and respiratory to see. Respiratory recommended chest xray and lasix. Dr. Jillyn Hidden saw patient and orders for chest xray, lasix, and solumedrol were placed. Also recommended patient be admitted overnight. Dr Ernestina Patches also saw patient.

## 2022-06-04 NOTE — Transfer of Care (Signed)
Immediate Anesthesia Transfer of Care Note  Patient: Deborah Benson  Procedure(s) Performed: PELVIC EXAM UNDER ANESTHESIA DILATATION AND CURETTAGE MIRENA INTRAUTERINE DEVICE (IUD) INSERTION  Patient Location: PACU  Anesthesia Type:General  Level of Consciousness: drowsy  Airway & Oxygen Therapy: Patient Spontanous Breathing and Patient connected to face mask oxygen  Post-op Assessment: Report given to RN and Post -op Vital signs reviewed and stable  Post vital signs: Reviewed and stable  Last Vitals:  Vitals Value Taken Time  BP 160/77 06/04/22 1449  Temp    Pulse 81 06/04/22 1451  Resp 14 06/04/22 1451  SpO2 91 % 06/04/22 1451  Vitals shown include unvalidated device data.  Last Pain:  Vitals:   06/04/22 1113  TempSrc:   PainSc: 0-No pain         Complications: No notable events documented.

## 2022-06-04 NOTE — Anesthesia Preprocedure Evaluation (Addendum)
Anesthesia Evaluation  Patient identified by MRN, date of birth, ID band Patient awake    Reviewed: Allergy & Precautions, NPO status , Patient's Chart, lab work & pertinent test results  History of Anesthesia Complications Negative for: history of anesthetic complications  Airway Mallampati: I  TM Distance: >3 FB Neck ROM: Full    Dental  (+) Edentulous Upper, Edentulous Lower, Dental Advisory Given   Pulmonary asthma , Current Smoker   Pulmonary exam normal        Cardiovascular hypertension, Normal cardiovascular exam     Neuro/Psych  PSYCHIATRIC DISORDERS  Depression    negative neurological ROS     GI/Hepatic negative GI ROS, Neg liver ROS,,,  Endo/Other  diabetes  Morbid obesity  Renal/GU negative Renal ROS     Musculoskeletal negative musculoskeletal ROS (+)    Abdominal   Peds  Hematology  (+) Blood dyscrasia, anemia   Anesthesia Other Findings   Reproductive/Obstetrics                             Anesthesia Physical Anesthesia Plan  ASA: 3  Anesthesia Plan: General   Post-op Pain Management: Tylenol PO (pre-op)* and Toradol IV (intra-op)*   Induction: Intravenous  PONV Risk Score and Plan: 4 or greater and Ondansetron, Dexamethasone, Midazolam and Scopolamine patch - Pre-op  Airway Management Planned: Oral ETT  Additional Equipment:   Intra-op Plan:   Post-operative Plan: Extubation in OR  Informed Consent: I have reviewed the patients History and Physical, chart, labs and discussed the procedure including the risks, benefits and alternatives for the proposed anesthesia with the patient or authorized representative who has indicated his/her understanding and acceptance.     Dental advisory given  Plan Discussed with: Anesthesiologist and CRNA  Anesthesia Plan Comments:        Anesthesia Quick Evaluation

## 2022-06-04 NOTE — Assessment & Plan Note (Signed)
-   Order Sensitive  SSI     -  check TSH and HgA1C  - Hold by mouth medications*  

## 2022-06-05 ENCOUNTER — Inpatient Hospital Stay (HOSPITAL_COMMUNITY): Payer: Medicaid Other

## 2022-06-05 DIAGNOSIS — R0902 Hypoxemia: Secondary | ICD-10-CM | POA: Diagnosis present

## 2022-06-05 DIAGNOSIS — J4541 Moderate persistent asthma with (acute) exacerbation: Secondary | ICD-10-CM | POA: Diagnosis not present

## 2022-06-05 DIAGNOSIS — J9621 Acute and chronic respiratory failure with hypoxia: Secondary | ICD-10-CM

## 2022-06-05 DIAGNOSIS — R0609 Other forms of dyspnea: Secondary | ICD-10-CM

## 2022-06-05 LAB — CBC
HCT: 34.8 % — ABNORMAL LOW (ref 36.0–46.0)
Hemoglobin: 9.7 g/dL — ABNORMAL LOW (ref 12.0–15.0)
MCH: 23.3 pg — ABNORMAL LOW (ref 26.0–34.0)
MCHC: 27.9 g/dL — ABNORMAL LOW (ref 30.0–36.0)
MCV: 83.5 fL (ref 80.0–100.0)
Platelets: 276 10*3/uL (ref 150–400)
RBC: 4.17 MIL/uL (ref 3.87–5.11)
RDW: 19.7 % — ABNORMAL HIGH (ref 11.5–15.5)
WBC: 9.1 10*3/uL (ref 4.0–10.5)
nRBC: 0 % (ref 0.0–0.2)

## 2022-06-05 LAB — IRON AND TIBC
Iron: 37 ug/dL (ref 28–170)
Saturation Ratios: 9 % — ABNORMAL LOW (ref 10.4–31.8)
TIBC: 424 ug/dL (ref 250–450)
UIBC: 387 ug/dL

## 2022-06-05 LAB — ECHOCARDIOGRAM COMPLETE
AR max vel: 2.01 cm2
AV Area VTI: 2.27 cm2
AV Area mean vel: 1.9 cm2
AV Mean grad: 8 mmHg
AV Peak grad: 15 mmHg
Ao pk vel: 1.94 m/s
Area-P 1/2: 3.27 cm2
Height: 63.5 in
S' Lateral: 3.2 cm
Weight: 6768.02 oz

## 2022-06-05 LAB — GLUCOSE, CAPILLARY
Glucose-Capillary: 206 mg/dL — ABNORMAL HIGH (ref 70–99)
Glucose-Capillary: 256 mg/dL — ABNORMAL HIGH (ref 70–99)
Glucose-Capillary: 272 mg/dL — ABNORMAL HIGH (ref 70–99)
Glucose-Capillary: 279 mg/dL — ABNORMAL HIGH (ref 70–99)
Glucose-Capillary: 303 mg/dL — ABNORMAL HIGH (ref 70–99)
Glucose-Capillary: 325 mg/dL — ABNORMAL HIGH (ref 70–99)
Glucose-Capillary: 326 mg/dL — ABNORMAL HIGH (ref 70–99)

## 2022-06-05 LAB — MAGNESIUM: Magnesium: 2.7 mg/dL — ABNORMAL HIGH (ref 1.7–2.4)

## 2022-06-05 LAB — COMPREHENSIVE METABOLIC PANEL
ALT: 14 U/L (ref 0–44)
AST: 16 U/L (ref 15–41)
Albumin: 3.2 g/dL — ABNORMAL LOW (ref 3.5–5.0)
Alkaline Phosphatase: 74 U/L (ref 38–126)
Anion gap: 4 — ABNORMAL LOW (ref 5–15)
BUN: 12 mg/dL (ref 6–20)
CO2: 29 mmol/L (ref 22–32)
Calcium: 8.5 mg/dL — ABNORMAL LOW (ref 8.9–10.3)
Chloride: 104 mmol/L (ref 98–111)
Creatinine, Ser: 0.77 mg/dL (ref 0.44–1.00)
GFR, Estimated: >60 mL/min (ref >60)
Glucose, Bld: 230 mg/dL — ABNORMAL HIGH (ref 70–99)
Potassium: 5.5 mmol/L — ABNORMAL HIGH (ref 3.5–5.1)
Sodium: 137 mmol/L (ref 135–145)
Total Bilirubin: 0.3 mg/dL (ref 0.3–1.2)
Total Protein: 8.6 g/dL — ABNORMAL HIGH (ref 6.5–8.1)

## 2022-06-05 LAB — TSH: TSH: 1.348 u[IU]/mL (ref 0.350–4.500)

## 2022-06-05 LAB — PHOSPHORUS: Phosphorus: 3.3 mg/dL (ref 2.5–4.6)

## 2022-06-05 LAB — LACTIC ACID, PLASMA: Lactic Acid, Venous: 1.3 mmol/L (ref 0.5–1.9)

## 2022-06-05 LAB — VITAMIN B12: Vitamin B-12: 226 pg/mL (ref 180–914)

## 2022-06-05 LAB — HIV ANTIBODY (ROUTINE TESTING W REFLEX): HIV Screen 4th Generation wRfx: NONREACTIVE

## 2022-06-05 LAB — FERRITIN: Ferritin: 23 ng/mL (ref 11–307)

## 2022-06-05 LAB — TROPONIN I (HIGH SENSITIVITY)
Troponin I (High Sensitivity): 7 ng/L (ref <18)
Troponin I (High Sensitivity): 6 ng/L (ref <18)

## 2022-06-05 MED ORDER — ALBUTEROL SULFATE (2.5 MG/3ML) 0.083% IN NEBU
INHALATION_SOLUTION | RESPIRATORY_TRACT | Status: AC
  Filled 2022-06-05: qty 3

## 2022-06-05 MED ORDER — INSULIN ASPART 100 UNIT/ML IJ SOLN
INTRAMUSCULAR | Status: AC
  Filled 2022-06-05: qty 1

## 2022-06-05 MED ORDER — INSULIN DETEMIR 100 UNIT/ML ~~LOC~~ SOLN
15.0000 [IU] | Freq: Every day | SUBCUTANEOUS | Status: DC
  Administered 2022-06-05: 15 [IU] via SUBCUTANEOUS
  Filled 2022-06-05 (×2): qty 0.15

## 2022-06-05 MED ORDER — METHYLPREDNISOLONE SODIUM SUCC 125 MG IJ SOLR: 40.0000 mg | Freq: Two times a day (BID) | INTRAMUSCULAR | Status: DC

## 2022-06-05 MED ORDER — BENZONATATE 100 MG PO CAPS
100.0000 mg | ORAL_CAPSULE | Freq: Two times a day (BID) | ORAL | Status: DC
  Administered 2022-06-05 – 2022-06-08 (×6): 100 mg via ORAL
  Filled 2022-06-05 (×6): qty 1

## 2022-06-05 MED ORDER — INSULIN ASPART 100 UNIT/ML IJ SOLN
3.0000 [IU] | Freq: Three times a day (TID) | INTRAMUSCULAR | Status: DC
  Administered 2022-06-05: 3 [IU] via SUBCUTANEOUS

## 2022-06-05 MED ORDER — GUAIFENESIN ER 600 MG PO TB12
1200.0000 mg | ORAL_TABLET | Freq: Two times a day (BID) | ORAL | Status: AC
  Administered 2022-06-05 – 2022-06-06 (×4): 1200 mg via ORAL
  Filled 2022-06-05 (×6): qty 2

## 2022-06-05 MED ORDER — MEDROXYPROGESTERONE ACETATE 10 MG PO TABS
10.0000 mg | ORAL_TABLET | Freq: Every day | ORAL | Status: DC
  Administered 2022-06-05 – 2022-06-08 (×4): 10 mg via ORAL
  Filled 2022-06-05 (×4): qty 1

## 2022-06-05 MED ORDER — PREDNISONE 20 MG PO TABS
40.0000 mg | ORAL_TABLET | Freq: Every day | ORAL | Status: DC
  Administered 2022-06-06: 40 mg via ORAL
  Filled 2022-06-05: qty 2

## 2022-06-05 MED ORDER — SODIUM CHLORIDE 0.9 % IV SOLN
100.0000 mg | Freq: Two times a day (BID) | INTRAVENOUS | Status: DC
  Administered 2022-06-05 (×2): 100 mg via INTRAVENOUS
  Filled 2022-06-05 (×2): qty 100

## 2022-06-05 MED ORDER — BUDESONIDE 0.5 MG/2ML IN SUSP
0.5000 mg | Freq: Two times a day (BID) | RESPIRATORY_TRACT | Status: DC
  Administered 2022-06-05 – 2022-06-08 (×7): 0.5 mg via RESPIRATORY_TRACT
  Filled 2022-06-05 (×7): qty 2

## 2022-06-05 MED ORDER — HYDROCODONE-ACETAMINOPHEN 5-325 MG PO TABS
ORAL_TABLET | ORAL | Status: AC
  Filled 2022-06-05: qty 2

## 2022-06-05 MED ORDER — IPRATROPIUM-ALBUTEROL 0.5-2.5 (3) MG/3ML IN SOLN: 3.0000 mL | RESPIRATORY_TRACT | Status: DC | PRN

## 2022-06-05 MED ORDER — REVEFENACIN 175 MCG/3ML IN SOLN
175.0000 ug | Freq: Every day | RESPIRATORY_TRACT | Status: DC
  Administered 2022-06-05 – 2022-06-08 (×4): 175 ug via RESPIRATORY_TRACT
  Filled 2022-06-05 (×4): qty 3

## 2022-06-05 MED ORDER — INSULIN ASPART 100 UNIT/ML IJ SOLN
INTRAMUSCULAR | Status: AC
  Administered 2022-06-05: 5 [IU] via SUBCUTANEOUS
  Filled 2022-06-05: qty 1

## 2022-06-05 MED ORDER — ARFORMOTEROL TARTRATE 15 MCG/2ML IN NEBU
15.0000 ug | INHALATION_SOLUTION | Freq: Two times a day (BID) | RESPIRATORY_TRACT | Status: DC
  Administered 2022-06-05 – 2022-06-08 (×7): 15 ug via RESPIRATORY_TRACT
  Filled 2022-06-05 (×8): qty 2

## 2022-06-05 MED ORDER — METHYLPREDNISOLONE SODIUM SUCC 125 MG IJ SOLR
40.0000 mg | Freq: Two times a day (BID) | INTRAMUSCULAR | Status: AC
  Administered 2022-06-05: 40 mg via INTRAVENOUS
  Filled 2022-06-05: qty 2

## 2022-06-05 MED ORDER — GABAPENTIN 100 MG PO CAPS
100.0000 mg | ORAL_CAPSULE | Freq: Two times a day (BID) | ORAL | Status: DC
  Administered 2022-06-05 – 2022-06-08 (×7): 100 mg via ORAL
  Filled 2022-06-05 (×8): qty 1

## 2022-06-05 MED ORDER — METHYLPREDNISOLONE SODIUM SUCC 125 MG IJ SOLR
INTRAMUSCULAR | Status: AC
  Filled 2022-06-05: qty 2

## 2022-06-05 NOTE — Progress Notes (Signed)
1 Day Post-Op Procedure(s) (LRB): PELVIC EXAM UNDER ANESTHESIA (N/A) DILATATION AND CURETTAGE (N/A) MIRENA INTRAUTERINE DEVICE (IUD) INSERTION (N/A)  Subjective: Patient reports having the onset of shortness of breath around 1 week ago. She had pneumonia 4 weeks ago and states she did not have shortness of breath during that time. She felt the BiPAP helped overnight but it felt like her face was being pulled into the mask. Reporting pelvic pain when coughing. She had vaginal bleeding yesterday evening and less this am. No nausea or emesis reported. All questions answered.  Objective: Vital signs in last 24 hours: Temp:  [98 F (36.7 C)-99.8 F (37.7 C)] 98 F (36.7 C) (12/27 0715) Pulse Rate:  [63-95] 68 (12/27 0600) Resp:  [10-24] 15 (12/27 0600) BP: (118-175)/(71-95) 158/82 (12/27 0715) SpO2:  [82 %-99 %] 92 % (12/27 0600) Weight:  [423 lb (191.9 kg)] 423 lb (191.9 kg) (12/26 1047)    Intake/Output from previous day: 12/26 0701 - 12/27 0700 In: 1250 [I.V.:950; IV Piggyback:300] Out: 1600 [Urine:1600]  Physical Examination: General: alert, cooperative, and no distress Resp: scattered bilateral wheezes Cardio: regular rate and rhythm, S1, S2 normal, no murmur, click, rub or gallop Extremities: extremities normal, atraumatic, no cyanosis or edema  Labs: WBC/Hgb/Hct/Plts:  9.1/9.7/34.8/276 (12/27 9470) BUN/Cr/glu/ALT/AST/amyl/lip:  12/0.77/--/14/16/--/-- (12/27 9628)  Assessment: 61 y.o. s/p Procedure(s): PELVIC EXAM UNDER ANESTHESIA, DILATATION AND CURETTAGE, MIRENA INTRAUTERINE DEVICE (IUD) INSERTION kept overnight given respiratory status immediately post-op with admission recommended for acute respiratory failure with hypoxia and hypercarbia- moderate persistent asthma/COPD with acute exacerbation: stable  Pain:  Pain is well-controlled on PRN medications.  Heme: Hgb 9.7 and Hct 34.8 this am.  ID: WBC 9.1 this am. Doxycycline ordered per hospitalist team.  CV: BP and HR  stable. Continue to monitor while inpatient with vital signs.  Resp: Admitted for acute respiratory failure with hypoxia and hypercarbia- moderate persistent asthma/COPD with acute exacerbation. Currently on 4 L. On steroid taper, doxycycline, scheduled duonebs, albuterol prn.  GI:  Tolerating po: Yes    GU: Voiding adequate amounts without difficulty. Creatinine 0.77  FEN: No critical values on am Cmet. K+ at 5.5  Endo: Diabetes mellitus Type II, under fair control.  CBG: CBG (last 3)  Recent Labs    06/04/22 2336 06/05/22 0313 06/05/22 1002  GLUCAP 307* 256* 206*     Prophylaxis: Lovenox ordered.  Plan: Continue plan of care per the Hospitalist Team Pulmonary consult   LOS: 1 day    Dorothyann Gibbs 06/05/2022, 7:35 AM

## 2022-06-05 NOTE — Plan of Care (Signed)
  Problem: Coping: Goal: Ability to adjust to condition or change in health will improve Outcome: Progressing   Problem: Fluid Volume: Goal: Ability to maintain a balanced intake and output will improve Outcome: Progressing   Problem: Health Behavior/Discharge Planning: Goal: Ability to manage health-related needs will improve Outcome: Progressing   Problem: Metabolic: Goal: Ability to maintain appropriate glucose levels will improve Outcome: Progressing

## 2022-06-05 NOTE — Consult Note (Signed)
NAME:  Deborah Benson, MRN:  [id number], DOB:  [birth year], LOS: 1 ADMISSION DATE:  06/04/2022, CONSULTATION DATE:  12/27 REFERRING MD:  Dr. Darnell Level, CHIEF COMPLAINT:  SOB   History of Present Illness:  61 y/o F who presented to [facility] on 12/26 for planned DIC in the setting of endometrial lesion.    The patient reports she had pneumonia a month prior to admit. She notes she has been having wheezing, cough and shortness of breath since that time.  She is a known smoker.    She underwent planned dilation and curettage, with Mirena intrauterine device insertion. Intra-op she had respiratory congestion and required oxygen. She was given 77m crystalloid during the case. She was placed on bipap post-operatively for mild hypercarbia / acute respiratory acidosis. The patient had ongoing wheezing and cough.  She was treated with nebulized bronchodilators and IV steroids.  Oxygen was weaned to 3L.  She has ongoing barking cough. She was given lasix.    PCCM consulted for pulmonary evaluation.    Pertinent  Medical History  Arthritis  Asthma  Depression  Gout  HTN BMI >70 Neuropathy  Ovarian Cancer  Tobacco Abuse   Significant Hospital Events: Including procedures, antibiotic start and stop dates in addition to other pertinent events   [facility]27 PCCM consulted   Interim History / Subjective:  As above Afebrile  O2 weaned to 2L   Objective   Blood pressure 121/80, pulse 65, temperature 98.9 F (37.2 C), resp. rate 15, height 5' 3.5" (1.613 m), weight (!) 191.9 kg, SpO2 99 %.        Intake/Output Summary (Last 24 hours) at 06/05/2022 1011 Last data filed at 06/05/2022 0600 Gross per 24 hour  Intake 1250 ml  Output 1600 ml  Net -350 ml   Filed Weights   06/04/22 1047  Weight: (!) 191.9 kg    Examination: General: adult female sitting up on side of bed in NAD HENT: MM pink/moist, [place] O2, barking cough, pupils =/reactive  Lungs:  non-labored at rest, lungs bilaterally with wheezing, coarse breath sounds Cardiovascular: s1s2 RRR Abdomen: protuberant, soft, bsx4 active  Extremities: warm/dry, BLE with edema but difficult to assess due to habitus Neuro: AAOx4, speech clear, MAE   Resolved Hospital Problem list     Assessment & Plan:   Acute Hypoxic / Hypercarbic Respiratory Failure due to Post Operative Atelectasis superimposed on likely undiagnosed Sleep Disordered Breathing Tobacco Abuse  -add brovana, pulmicort, yupelri  -PRN duoneb  -add mucinex  -continue O2 for sats 90-94%, wean to off  -continue steroids with taper to off  -will need outpatient pulmonary evaluation with PSG -smoking cessation counseling  -pulmonary hygiene - incentive spirometry / flutter valve  -OOB to chair   Best Practice (right click and "Reselect all SmartList Selections" daily)  Per TRH   Labs   CBC: Recent Labs  Lab 06/04/22 1854 06/05/22 0607  WBC 11.4* 9.1  NEUTROABS 10.7*  --   HGB 10.5* 9.7*  HCT 36.8 34.8*  MCV 82.3 83.5  PLT 267 2979   Basic Metabolic Panel: Recent Labs  Lab 06/04/22 1854 06/05/22 0607  NA 135 137  K 5.0 5.5*  CL 100 104  CO2 27 29  GLUCOSE 197* 230*  BUN 8 12  CREATININE 0.82 0.77  CALCIUM 8.3* 8.5*  MG 2.3 2.7*  PHOS 3.6 3.3   GFR: Estimated Creatinine Clearance: 128.6 mL/min (by C-G formula based on SCr of 0.77 mg/dL).  Recent Labs  Lab 06/04/22 1854 06/05/22 0607  WBC 11.4* 9.1  LATICACIDVEN 1.6 1.3    Liver Function Tests: Recent Labs  Lab 06/04/22 1854 06/05/22 0607  AST 22 16  ALT 17 14  ALKPHOS 75 74  BILITOT 0.5 0.3  PROT 9.0* 8.6*  ALBUMIN 3.3* 3.2*   No results for input(s): "LIPASE", "AMYLASE" in the last 168 hours. No results for input(s): "AMMONIA" in the last 168 hours.  ABG    Component Value Date/Time   PHART 7.34 (L) 06/04/2022 1837   PCO2ART 59 (H) 06/04/2022 1837   PO2ART 75 (L) 06/04/2022 1837   HCO3 31.8 (H) 06/04/2022 1837   O2SAT  96.4 06/04/2022 1837     Coagulation Profile: No results for input(s): "INR", "PROTIME" in the last 168 hours.  Cardiac Enzymes: Recent Labs  Lab 06/04/22 1854  CKTOTAL 128    HbA1C: HbA1c, POC (controlled diabetic range)  Date/Time Value Ref Range Status  01/02/2022 11:28 AM 8.0 (A) 0.0 - 7.0 % Final  06/12/2021 02:59 PM 7.2 (A) 0.0 - 7.0 % Final   Hgb A1c MFr Bld  Date/Time Value Ref Range Status  05/13/2022 12:44 PM 7.8 (H) 4.8 - 5.6 % Final    Comment:    (NOTE)         Prediabetes: 5.7 - 6.4         Diabetes: >6.4         Glycemic control for adults with diabetes: <7.0     CBG: Recent Labs  Lab 06/04/22 1058 06/04/22 1456 06/04/22 2336 06/05/22 0313 06/05/22 1002  GLUCAP 177* 146* 307* 256* 206*    Review of Systems: Positives in [place]   Gen: Denies fever, chills, weight change, fatigue, night sweats HEENT: Denies blurred vision, double vision, hearing loss, tinnitus, sinus congestion, rhinorrhea, sore throat, neck stiffness, dysphagia PULM: Denies shortness of breath, cough, sputum production, hemoptysis, wheezing CV: Denies chest pain, edema, orthopnea, paroxysmal nocturnal dyspnea, palpitations GI: Denies abdominal pain, nausea, vomiting, diarrhea, hematochezia, melena, constipation, change in bowel habits GU: Denies dysuria, hematuria, polyuria, oliguria, urethral discharge Endocrine: Denies hot or cold intolerance, polyuria, polyphagia or appetite change Derm: Denies rash, dry skin, scaling or peeling skin change Heme: Denies easy bruising, bleeding, bleeding gums Neuro: Denies headache, numbness, weakness, slurred speech, loss of memory or consciousness   Past Medical History:  She,  has a past medical history of Arthritis, Asthma, Cancer ([place]), Depression, Diabetes mellitus without complication ([place]), Gout, HTN (hypertension), Neuromuscular disorder ([place]), Neuropathy, Obesity, and Pneumonia.   Surgical History:   Past Surgical History:  Procedure  Laterality Date   FOOT SURGERY     due to needle break in foot   MULTIPLE TOOTH EXTRACTIONS     TONSILLECTOMY       Social History:   reports that she has been smoking cigarettes. She started smoking about 44 years ago. She has been smoking an average of .4 packs per day. She has never used smokeless tobacco. She reports that she does not drink alcohol and does not use drugs.   Family History:  Her family history includes Cancer - Ovarian in her maternal grandmother; Dementia in her maternal grandfather; Diabetes in her brother; Glaucoma in her father and another family member; Gout in her mother; Heart attack in her mother; Heart disease in her sister; Hypertension in her mother; Lung cancer in her father; Other in her sister; Seizures in an other family member; Stroke in her mother. There is no history of  Colon cancer, Breast cancer, Endometrial cancer, Prostate cancer, or Pancreatic cancer.   Allergies Allergies  Allergen Reactions   Penicillins Hives    Denies airway involvement     Home Medications  Prior to Admission medications   Medication Sig Start Date End Date Taking? Authorizing Provider  albuterol (PROVENTIL) (2.5 MG/3ML) 0.083% nebulizer solution Take 3 mLs (2.5 mg total) by nebulization every 6 (six) hours as needed for wheezing or shortness of breath. 07/25/21  Yes Hensel, Jamal Collin, MD  gabapentin (NEURONTIN) 100 MG capsule Take 1 capsule (100 mg total) by mouth 3 (three) times daily. Patient taking differently: Take 100 mg by mouth 2 (two) times daily. 01/02/22  Yes Lind Covert, MD  medroxyPROGESTERone (PROVERA) 10 MG tablet Take 1 tablet (10 mg total) by mouth in the morning and at bedtime. 04/19/22  Yes Megan Salon, MD  metFORMIN (GLUCOPHAGE-XR) 500 MG 24 hr tablet TAKE 1 TABLET(500 MG) BY MOUTH IN THE MORNING AND AT BEDTIME Patient taking differently: Take 500 mg by mouth daily. 04/08/22  Yes Chambliss, Jeb Levering, MD  naproxen sodium (ALEVE) 220 MG tablet  Take 440 mg by mouth at bedtime as needed (pain).   Yes [provider]  nystatin (MYCOSTATIN/NYSTOP) powder Apply 1 Application topically 3 (three) times daily as needed. Patient taking differently: Apply 1 Application topically daily. 04/09/22  Yes Leftwich-Kirby, Kathie Dike, CNM  PROAIR HFA 108 (90 Base) MCG/ACT inhaler INHALE 2 PUFFS BY MOUTH EVERY 6 HOURS AS NEEDED FOR SHORTNESS OF BREATH. PLEASE MAKE AN APPOINTMENT 07/13/21  Yes Welborn, Ryan, DO  TRULICITY 3.38 VA/9.1BT SOPN ADMINISTER 0.75 MG UNDER THE SKIN 1 TIME A WEEK 05/13/22  Yes Chambliss, Jeb Levering, MD  metroNIDAZOLE (FLAGYL) 500 MG tablet Take 1 tablet (500 mg total) by mouth 2 (two) times daily. Patient not taking: Reported on 05/17/2022 04/24/22   Megan Salon, MD     Critical care time: n/a    Noe Gens, MSN, APRN, NP-C, AGACNP-BC  Pulmonary & Critical Care 06/05/2022, 10:11 AM   Please see [email or web address] for pager details.   From 7A-7P if no response, please call [phone] After hours, please call ELink [phone]

## 2022-06-05 NOTE — Progress Notes (Signed)
Mobility Specialist - Progress Note   06/05/22 1640  Mobility  Activity Transferred from chair to bed  Level of Assistance Standby assist, set-up cues, supervision of patient - no hands on  Assistive Device None  Range of Motion/Exercises Active  Activity Response Tolerated well  $Mobility charge 1 Mobility   NT requested assistance in transferring pt from recliner chair to bed. Pt was able to stand on her own and pivot to bed. Pt was left in bed with necessities in reach.  Ferd Hibbs Mobility Specialist

## 2022-06-05 NOTE — Progress Notes (Signed)
Patient declines nocturnal PAP use tonight. However, she does convey interest in outpatient studies to obtain a more comfortable apparatus since she feels that the little time worn last night was helpful and she rested better. RT will continue to follow and encourage use.

## 2022-06-05 NOTE — Progress Notes (Signed)
OT Cancellation Note  Patient Details Name: Deborah Benson MRN: [id number] DOB: Jun 23, 1960   Cancelled Treatment:    Reason Eval/Treat Not Completed: Medical issues which prohibited therapy Patient is in PACU after significant event this AM per chart review. Patient pending transfer to SDU. OT to continue to follow and check back for medical stability.  Rennie Plowman, MS Acute Rehabilitation Department Office# [phone]  06/05/2022, 9:22 AM

## 2022-06-05 NOTE — Progress Notes (Signed)
PROGRESS NOTE    Deborah Benson  IRS:[id number] DOB: 04-14-1961 DOA: 06/04/2022 PCP: Lind Covert, MD  61 y/o F who presented to [facility] on 12/26 for planned DIC in the setting of endometrial lesion.   -h/o pneumonia a month prior to admit. She notes she has been having wheezing, cough and shortness of breath since that time.  She is a known smoker.    She underwent planned dilation and curettage, with Mirena intrauterine device insertion for endometrial intraepithelial neoplasia. Intra-op she had respiratory congestion and required oxygen. She was placed on bipap post-operatively for mild hypercarbia / acute respiratory acidosis.   Subjective: Complains of cough, some shortness of breath  Assessment and Plan:  Acute Hypoxic and hypercarbic Respiratory Failure  OSA/OHS Tobacco Abuse  Atelectasis, postop -Appreciate pulmonary input,, pulmonary toileting -Continue Brovana, Pulmicort, DuoNebs, Mucinex  -Prednisone taper -Increase activity, incentive spirometry, flutter valve -Needs sleep study as outpatient, pulmonary follow-up  Endometrial intraepithelial neoplasia Pelvic exam under anesthesia, D&C, IUD insertion -Per GYN  Morbid obesity, BMI> 70  Type 2 diabetes mellitus -Follow-up HbA1c, continue sliding scale insulin -Add Levemir  Acute on chronic anemia -Anemia panel with iron deficiency, will administer IV iron today  DVT prophylaxis: SCDs Code Status: DNR Family Communication: None present Disposition Plan: Home in 1 to 2 days  Consultants: Pulm, GYN   Procedures:   Antimicrobials:    Objective: Vitals:   06/05/22 0600 06/05/22 0715 06/05/22 0730 06/05/22 0745  BP:  (!) 158/82 121/80   Pulse: 68 (!) 58 60 65  Resp: '15 10 12 15  '$ Temp:  98.9 F (37.2 C)    TempSrc:      SpO2: 92% 97% 98% 99%  Weight:      Height:        Intake/Output Summary (Last 24 hours) at 06/05/2022 1224 Last data filed at 06/05/2022 0600 Gross per 24 hour  Intake 1250 ml   Output 1600 ml  Net -350 ml   Filed Weights   06/04/22 1047  Weight: (!) 191.9 kg    Examination:  General exam: Leory Plowman obese female seen in PACU, sitting up at the edge of the bed, AAOx3 HEENT: Neck obese unable to assess JVD CVS: S1-S2, distant heart sounds Lungs: Distant breath sounds Abdomen: Soft, obese, nontender, bowel sounds present Extremities: Trace edema, chronic skin changes in both lower legs Psychiatry:  Mood & affect appropriate.     Data Reviewed:   CBC: Recent Labs  Lab 06/04/22 1854 06/05/22 0607  WBC 11.4* 9.1  NEUTROABS 10.7*  --   HGB 10.5* 9.7*  HCT 36.8 34.8*  MCV 82.3 83.5  PLT 267 009   Basic Metabolic Panel: Recent Labs  Lab 06/04/22 1854 06/05/22 0607  NA 135 137  K 5.0 5.5*  CL 100 104  CO2 27 29  GLUCOSE 197* 230*  BUN 8 12  CREATININE 0.82 0.77  CALCIUM 8.3* 8.5*  MG 2.3 2.7*  PHOS 3.6 3.3   GFR: Estimated Creatinine Clearance: 128.6 mL/min (by C-G formula based on SCr of 0.77 mg/dL). Liver Function Tests: Recent Labs  Lab 06/04/22 1854 06/05/22 0607  AST 22 16  ALT 17 14  ALKPHOS 75 74  BILITOT 0.5 0.3  PROT 9.0* 8.6*  ALBUMIN 3.3* 3.2*   No results for input(s): "LIPASE", "AMYLASE" in the last 168 hours. No results for input(s): "AMMONIA" in the last 168 hours. Coagulation Profile: No results for input(s): "INR", "PROTIME" in the last 168 hours. Cardiac Enzymes: Recent Labs  Lab 06/04/22 1854  CKTOTAL 128   BNP (last 3 results) No results for input(s): "PROBNP" in the last 8760 hours. HbA1C: No results for input(s): "HGBA1C" in the last 72 hours. CBG: Recent Labs  Lab 06/04/22 1058 06/04/22 1456 06/04/22 2336 06/05/22 0313 06/05/22 1002  GLUCAP 177* 146* 307* 256* 206*   Lipid Profile: No results for input(s): "CHOL", "HDL", "LDLCALC", "TRIG", "CHOLHDL", "LDLDIRECT" in the last 72 hours. Thyroid Function Tests: Recent Labs    06/04/22 1854  TSH 1.348   Anemia Panel: Recent Labs     06/04/22 1854  VITAMINB12 226  FOLATE 11.6  FERRITIN 23  TIBC 424  IRON 37  RETICCTPCT 1.8   Urine analysis:    Component Value Date/Time   COLORURINE YELLOW 03/02/2008 1624   APPEARANCEUR CLEAR 03/02/2008 1624   LABSPEC 1.006 03/02/2008 1624   PHURINE 7.5 03/02/2008 1624   GLUCOSEU NEGATIVE 03/02/2008 1624   HGBUR NEGATIVE 03/02/2008 1624   BILIRUBINUR NEGATIVE 03/02/2008 1624   KETONESUR NEGATIVE 03/02/2008 1624   PROTEINUR NEGATIVE 03/02/2008 1624   UROBILINOGEN 0.2 03/02/2008 1624   NITRITE NEGATIVE 03/02/2008 1624   LEUKOCYTESUR  03/02/2008 1624    NEGATIVE MICROSCOPIC NOT DONE ON URINES WITH NEGATIVE PROTEIN, BLOOD, LEUKOCYTES, NITRITE, OR GLUCOSE <1000 mg/dL.   Sepsis Labs: '@LABRCNTIP'$ (procalcitonin:4,lacticidven:4)  ) Recent Results (from the past 240 hour(s))  Resp panel by RT-PCR (RSV, Flu A&B, Covid) Anterior Nasal Swab     Status: None   Collection Time: 06/04/22  6:25 PM   Specimen: Anterior Nasal Swab  Result Value Ref Range Status   SARS Coronavirus 2 by RT PCR NEGATIVE NEGATIVE Final    Comment: (NOTE) SARS-CoV-2 target nucleic acids are NOT DETECTED.  The SARS-CoV-2 RNA is generally detectable in upper respiratory specimens during the acute phase of infection. The lowest concentration of SARS-CoV-2 viral copies this assay can detect is 138 copies/mL. A negative result does not preclude SARS-Cov-2 infection and should not be used as the sole basis for treatment or other patient management decisions. A negative result may occur with  improper specimen collection/handling, submission of specimen other than nasopharyngeal swab, presence of viral mutation(s) within the areas targeted by this assay, and inadequate number of viral copies(<138 copies/mL). A negative result must be combined with clinical observations, patient history, and epidemiological information. The expected result is Negative.  Fact Sheet for Patients:   EntrepreneurPulse.com.au  Fact Sheet for Healthcare Providers:  IncredibleEmployment.be  This test is no t yet approved or cleared by the [place] FDA and  has been authorized for detection and/or diagnosis of SARS-CoV-2 by FDA under an Emergency Use Authorization (EUA). This EUA will remain  in effect (meaning this test can be used) for the duration of the COVID-19 declaration under Section 564(b)(1) of the Act, 21 U.S.C.section 360bbb-3(b)(1), unless the authorization is terminated  or revoked sooner.       Influenza A by PCR NEGATIVE NEGATIVE Final   Influenza B by PCR NEGATIVE NEGATIVE Final    Comment: (NOTE) The Xpert Xpress SARS-CoV-2/FLU/RSV plus assay is intended as an aid in the diagnosis of influenza from Nasopharyngeal swab specimens and should not be used as a sole basis for treatment. Nasal washings and aspirates are unacceptable for Xpert Xpress SARS-CoV-2/FLU/RSV testing.  Fact Sheet for Patients: EntrepreneurPulse.com.au  Fact Sheet for Healthcare Providers: IncredibleEmployment.be  This test is not yet approved or cleared by the [place] FDA and has been authorized for detection and/or diagnosis of SARS-CoV-2 by FDA under an  Emergency Use Authorization (EUA). This EUA will remain in effect (meaning this test can be used) for the duration of the COVID-19 declaration under Section 564(b)(1) of the Act, 21 U.S.C. section 360bbb-3(b)(1), unless the authorization is terminated or revoked.     Resp Syncytial Virus by PCR NEGATIVE NEGATIVE Final    Comment: (NOTE) Fact Sheet for Patients: EntrepreneurPulse.com.au  Fact Sheet for Healthcare Providers: IncredibleEmployment.be  This test is not yet approved or cleared by the [place] FDA and has been authorized for detection and/or diagnosis of SARS-CoV-2 by FDA under an Emergency Use  Authorization (EUA). This EUA will remain in effect (meaning this test can be used) for the duration of the COVID-19 declaration under Section 564(b)(1) of the Act, 21 U.S.C. section 360bbb-3(b)(1), unless the authorization is terminated or revoked.  Performed at [facility], [place] [address], [place], [place] [address]      Radiology Studies: X-ray chest PA or AP  Result Date: 06/04/2022 CLINICAL DATA:  Hypoxia EXAM: CHEST  1 VIEW COMPARISON:  07/17/2021 FINDINGS: Single frontal view of the chest demonstrates a stable cardiac silhouette. No acute airspace disease, effusion, or pneumothorax. No acute bony abnormalities. IMPRESSION: 1. No acute intrathoracic process. Electronically Signed   By: Randa Ngo M.D.   On: 06/04/2022 17:16     Scheduled Meds:  albuterol       arformoterol  15 mcg Nebulization BID   budesonide (PULMICORT) nebulizer solution  0.5 mg Nebulization BID   enoxaparin (LOVENOX) injection  40 mg Subcutaneous Q24H   gabapentin  100 mg Oral BID   guaiFENesin  1,200 mg Oral BID   HYDROcodone-acetaminophen       insulin aspart       insulin aspart  0-9 Units Subcutaneous Q4H   medroxyPROGESTERone  10 mg Oral Daily   methylPREDNISolone (SOLU-MEDROL) injection  40 mg Intravenous Q12H   methylPREDNISolone sodium succinate       revefenacin  175 mcg Nebulization Daily   scopolamine  1 patch Transdermal Q72H   sodium chloride flush  3 mL Intravenous Q12H   Continuous Infusions:  sodium chloride 10 mL/hr at 06/05/22 1024   doxycycline (VIBRAMYCIN) IV Stopped (06/05/22 0445)   lactated ringers 10 mL/hr at 06/04/22 1130     LOS: 1 day    Time spent: 37mn    PDomenic Polite MD [facility]   06/05/2022, 12:24 PM

## 2022-06-05 NOTE — Progress Notes (Signed)
*  PRELIMINARY RESULTS* Echocardiogram 2D Echocardiogram has been performed. Attempted to use Definity but only IV not viable  Lana Fish 06/05/2022, 5:14 PM

## 2022-06-06 ENCOUNTER — Telehealth: Payer: Self-pay | Admitting: Pulmonary Disease

## 2022-06-06 ENCOUNTER — Encounter (HOSPITAL_COMMUNITY): Payer: Self-pay | Admitting: Psychiatry

## 2022-06-06 DIAGNOSIS — J4541 Moderate persistent asthma with (acute) exacerbation: Secondary | ICD-10-CM | POA: Diagnosis not present

## 2022-06-06 LAB — BASIC METABOLIC PANEL
Anion gap: 4 — ABNORMAL LOW (ref 5–15)
BUN: 24 mg/dL — ABNORMAL HIGH (ref 6–20)
CO2: 29 mmol/L (ref 22–32)
Calcium: 8.6 mg/dL — ABNORMAL LOW (ref 8.9–10.3)
Chloride: 104 mmol/L (ref 98–111)
Creatinine, Ser: 0.85 mg/dL (ref 0.44–1.00)
GFR, Estimated: >60 mL/min (ref >60)
Glucose, Bld: 215 mg/dL — ABNORMAL HIGH (ref 70–99)
Potassium: 5.7 mmol/L — ABNORMAL HIGH (ref 3.5–5.1)
Sodium: 137 mmol/L (ref 135–145)

## 2022-06-06 LAB — CBC
HCT: 33.7 % — ABNORMAL LOW (ref 36.0–46.0)
Hemoglobin: 9.4 g/dL — ABNORMAL LOW (ref 12.0–15.0)
MCH: 23.4 pg — ABNORMAL LOW (ref 26.0–34.0)
MCHC: 27.9 g/dL — ABNORMAL LOW (ref 30.0–36.0)
MCV: 83.8 fL (ref 80.0–100.0)
Platelets: 308 10*3/uL (ref 150–400)
RBC: 4.02 MIL/uL (ref 3.87–5.11)
RDW: 19.9 % — ABNORMAL HIGH (ref 11.5–15.5)
WBC: 10.8 10*3/uL — ABNORMAL HIGH (ref 4.0–10.5)
nRBC: 0.2 % (ref 0.0–0.2)

## 2022-06-06 LAB — GLUCOSE, CAPILLARY
Glucose-Capillary: 226 mg/dL — ABNORMAL HIGH (ref 70–99)
Glucose-Capillary: 192 mg/dL — ABNORMAL HIGH (ref 70–99)
Glucose-Capillary: 217 mg/dL — ABNORMAL HIGH (ref 70–99)
Glucose-Capillary: 289 mg/dL — ABNORMAL HIGH (ref 70–99)
Glucose-Capillary: 305 mg/dL — ABNORMAL HIGH (ref 70–99)

## 2022-06-06 MED ORDER — ENOXAPARIN SODIUM 100 MG/ML IJ SOSY
100.0000 mg | PREFILLED_SYRINGE | INTRAMUSCULAR | Status: DC
  Administered 2022-06-07 – 2022-06-08 (×2): 100 mg via SUBCUTANEOUS
  Filled 2022-06-06 (×2): qty 1

## 2022-06-06 MED ORDER — ORAL CARE MOUTH RINSE: 15.0000 mL | OROMUCOSAL | Status: DC | PRN

## 2022-06-06 MED ORDER — INSULIN ASPART 100 UNIT/ML IJ SOLN
5.0000 [IU] | Freq: Three times a day (TID) | INTRAMUSCULAR | Status: DC
  Administered 2022-06-06 – 2022-06-07 (×6): 5 [IU] via SUBCUTANEOUS

## 2022-06-06 MED ORDER — INSULIN DETEMIR 100 UNIT/ML ~~LOC~~ SOLN
25.0000 [IU] | Freq: Every day | SUBCUTANEOUS | Status: DC
  Administered 2022-06-06: 25 [IU] via SUBCUTANEOUS
  Filled 2022-06-06 (×2): qty 0.25

## 2022-06-06 MED ORDER — SODIUM ZIRCONIUM CYCLOSILICATE 10 G PO PACK
10.0000 g | PACK | Freq: Once | ORAL | Status: AC
  Administered 2022-06-06: 10 g via ORAL
  Filled 2022-06-06: qty 1

## 2022-06-06 NOTE — Inpatient Diabetes Management (Signed)
Inpatient Diabetes Program Recommendations  AACE/ADA: New Consensus Statement on Inpatient Glycemic Control (2015)  Target Ranges:  Prepandial:   less than 140 mg/dL      Peak postprandial:   less than 180 mg/dL (1-2 hours)      Critically ill patients:  140 - 180 mg/dL   Lab Results  Component Value Date   GLUCAP 289 (H) 06/06/2022   HGBA1C 7.8 (H) 05/13/2022    Review of Glycemic Control  Diabetes history: DM2 Outpatient Diabetes medications: metformin 093 mg QD, Trulicity 8.18 mg weekly (has been on lower dose in past) Current orders for Inpatient glycemic control: Levemir 25 QD, Novolog 0-9 units Q4H + 5 units TID Pred 40 QD  HgbA1C - 7.8%  Inpatient Diabetes Program Recommendations:    Agree with orders. If post-prandials > 180, increase Novolog to 8 units TID  Spoke with pt at bedside to confirm home DM meds. Pt states she hasn't taken Trulicity in 2 weeks. Will restart when discharged. Blood sugars should improve with decrease in steroids. Pt does not monitor blood sugars at home. Encouraged her to obtain meter and check blood sugars several times/week and take logbook to PCP for review. Discussed HgbA1C of 7.8%. Pt had no questions/concerns regarding her diabetes or blood sugar control. Appreciative of visit.   Continue to follow.  Thank you. Lorenda Peck, RD, LDN, [place] Inpatient Diabetes Coordinator [phone]

## 2022-06-06 NOTE — Progress Notes (Signed)
OT Cancellation Note  Patient Details Name: Torrance Frech MRN: [id number] DOB: 1961/04/28   Cancelled Treatment:    Reason Eval/Treat Not Completed: OT screen initiated and pt determined to be without therapy needs. Per conversation with the pt's evaluating PT, the pt is at her baseline level of functioning for ADLs and mobility. OT will sign off. Thank you.    Leota Sauers, OTR/L 06/06/2022, 5:14 PM

## 2022-06-06 NOTE — Plan of Care (Signed)
  Problem: Coping: Goal: Ability to adjust to condition or change in health will improve Outcome: Progressing   Problem: Health Behavior/Discharge Planning: Goal: Ability to manage health-related needs will improve Outcome: Progressing   Problem: Nutritional: Goal: Maintenance of adequate nutrition will improve Outcome: Progressing   Problem: Skin Integrity: Goal: Risk for impaired skin integrity will decrease Outcome: Progressing   Problem: Tissue Perfusion: Goal: Adequacy of tissue perfusion will improve Outcome: Progressing

## 2022-06-06 NOTE — Telephone Encounter (Signed)
Patient seen in consultation while in hospital. Please arrange for hospital follow up with one of our sleep MD's for evaluation of sleep apnea.  Please notify patient of appt time.     Noe Gens, MSN, APRN, NP-C, AGACNP-BC [facility] 06/06/2022, 9:22 AM   Please see [email or web address] for pager details.   From 7A-7P if no response, please call [phone] After hours, please call ELink [phone]

## 2022-06-06 NOTE — Progress Notes (Signed)
Initial Nutrition Assessment  DOCUMENTATION CODES:   Morbid obesity  INTERVENTION:  - Carb Modified diet.  - Encourage intake of lean protein and fresh fruits and vegetables at meals. - Monitor weights.   NUTRITION DIAGNOSIS:   Limited adherence to nutrition-related recommendations related to limited prior education as evidenced by other (comment) (BMI 73.76).  GOAL:   Patient will meet greater than or equal to 90% of their needs  MONITOR:   PO intake, Weight trends  RD will sign off, please reconsult if further nutrition interventions arise.  REASON FOR ASSESSMENT:   Consult Assessment of nutrition requirement/status  ASSESSMENT:   61 y.o. female with medical history significant of DM2, obesity, asthma who presented for planned DIC in setting of endometrial lesion. Had to be placed on bipap post-operatively for mild hypercarbia / acute respiratory acidosis.   Patient in bed at time of visit, experiencing some difficulty breathing and coughing that has been going on since admit.  Patient reports UBW of 425#, no recent changes in weight. She reports that she usually has no breakfast and only has a lunch and dinner with snacks in between. However, she endorses having recently changed her meal habits to having a breakfast, lunch, and dinner and only 1 snack a day. She notes she cooks at home. Appetite has been poor over the past week since starting to feel poorly however current appetite is improved. She is documented to be consuming 100-200% of meals since admission. Encouraged intake of lean protein and fresh fruits and vegetables at all meals. Patient with no concerns or questions at end of visit.    Medications reviewed and include: -  Labs reviewed:  K+ 5.7 HA1C 7.8 Blood Glucose 192-326 x24 hours   NUTRITION - FOCUSED PHYSICAL EXAM:  Flowsheet Row Most Recent Value  Orbital Region No depletion  Upper Arm Region No depletion  Thoracic and Lumbar Region No  depletion  Buccal Region No depletion  Temple Region No depletion  Clavicle Bone Region No depletion  Clavicle and Acromion Bone Region No depletion  Scapular Bone Region Unable to assess  Dorsal Hand No depletion  Patellar Region No depletion  Anterior Thigh Region No depletion  Posterior Calf Region No depletion  Edema (RD Assessment) None  Hair Reviewed  Eyes Reviewed  Mouth Reviewed  Skin Reviewed  Nails Reviewed       Diet Order:   Diet Order             Diet Carb Modified Fluid consistency: Thin; Room service appropriate? Yes  Diet effective now                   EDUCATION NEEDS:  Education needs have been addressed  Skin:  Skin Assessment: Reviewed RN Assessment  Last BM:  12/27  Height:  Ht Readings from Last 1 Encounters:  06/04/22 5' 3.5" (1.613 m)   Weight:  Wt Readings from Last 1 Encounters:  06/04/22 (!) 191.9 kg    BMI:  Body mass index is 73.76 kg/m.  Estimated Nutritional Needs:  Kcal:  3159-4585 kcals Protein:  100-125 grams Fluid:  >/= 2.4L    Samson Frederic RD, LDN For contact information, refer to [facility].

## 2022-06-06 NOTE — Telephone Encounter (Signed)
Patient is scheduled for sleep consult on 1/30 at 3pm with Dr. Ander Slade. Patient notified via mychart and reminder mailed to address on file.

## 2022-06-06 NOTE — Progress Notes (Signed)
PROGRESS NOTE    Deborah Benson  ELF:[id number] DOB: 1960/08/18 DOA: 06/04/2022 PCP: Lind Covert, MD  61 y/o F who presented to [facility] on 12/26 for planned DIC in the setting of endometrial lesion.   -h/o pneumonia a month prior to admit. She notes she has been having wheezing, cough and shortness of breath since that time.  She is a known smoker.    She underwent planned dilation and curettage, with Mirena intrauterine device insertion for endometrial intraepithelial neoplasia. Intra-op she had respiratory congestion and required oxygen. She was placed on bipap post-operatively for mild hypercarbia / acute respiratory acidosis.   Subjective: -Feels better, breathing is improving, still with some cough and mild wheezing  Assessment and Plan:  Acute Hypoxic and hypercarbic Respiratory Failure  OSA/OHS Tobacco Abuse  Atelectasis, postop -Appreciate pulmonary input,, pulmonary toileting -Continue Brovana, Pulmicort, DuoNebs, Mucinex  -Slow improvement, continue prednisone taper, incentive spirometry, flutter valve -Attempt to wean off O2 today -Discussed with pulmonary they will arrange follow-up for sleep study  Endometrial intraepithelial neoplasia Pelvic exam under anesthesia, D&C, IUD insertion -Per GYN  Morbid obesity, BMI> 70  Type 2 diabetes mellitus -Follow-up HbA1c, continue sliding scale insulin -CBGs elevated in the setting of steroids, increase Levemir, meal coverage  Hyperkalemia -Likely secondary to steroids, add Lokelma, BMP in a.m.  Acute on chronic anemia -Anemia panel with iron deficiency, will administer IV iron today  DVT prophylaxis: SCDs Code Status: DNR Family Communication: None present Disposition Plan: Home in 1 to 2 days  Consultants: Pulm, GYN   Procedures:   Antimicrobials:    Objective: Vitals:   06/06/22 0130 06/06/22 0150 06/06/22 0358 06/06/22 0842  BP:   (!) 129/52   Pulse:   (!) 57   Resp:   19   Temp:   98 F (36.7 C)    TempSrc:   Oral   SpO2: 95% 96% 98% 98%  Weight:      Height:        Intake/Output Summary (Last 24 hours) at 06/06/2022 5852 Last data filed at 06/06/2022 7782 Gross per 24 hour  Intake 926 ml  Output --  Net 926 ml   Filed Weights   06/04/22 1047  Weight: (!) 191.9 kg    Examination:  General exam: Morbidly obese female sitting up in bed, AAOx3 HEENT: Neck obese CVS: S1-S2, regular rhythm Lungs: Distant breath sounds, scattered rhonchi Abdomen: Soft, obese, nontender, bowel sounds present Extremities: No edema,  chronic skin changes in both lower legs Psychiatry:  Mood & affect appropriate.     Data Reviewed:   CBC: Recent Labs  Lab 06/04/22 1854 06/05/22 0607 06/06/22 0511  WBC 11.4* 9.1 10.8*  NEUTROABS 10.7*  --   --   HGB 10.5* 9.7* 9.4*  HCT 36.8 34.8* 33.7*  MCV 82.3 83.5 83.8  PLT 267 276 423   Basic Metabolic Panel: Recent Labs  Lab 06/04/22 1854 06/05/22 0607 06/06/22 0511  NA 135 137 137  K 5.0 5.5* 5.7*  CL 100 104 104  CO2 '27 29 29  '$ GLUCOSE 197* 230* 215*  BUN 8 12 24*  CREATININE 0.82 0.77 0.85  CALCIUM 8.3* 8.5* 8.6*  MG 2.3 2.7*  --   PHOS 3.6 3.3  --    GFR: Estimated Creatinine Clearance: 121 mL/min (by C-G formula based on SCr of 0.85 mg/dL). Liver Function Tests: Recent Labs  Lab 06/04/22 1854 06/05/22 0607  AST 22 16  ALT 17 14  ALKPHOS 75 74  BILITOT  0.5 0.3  PROT 9.0* 8.6*  ALBUMIN 3.3* 3.2*   No results for input(s): "LIPASE", "AMYLASE" in the last 168 hours. No results for input(s): "AMMONIA" in the last 168 hours. Coagulation Profile: No results for input(s): "INR", "PROTIME" in the last 168 hours. Cardiac Enzymes: Recent Labs  Lab 06/04/22 1854  CKTOTAL 128   BNP (last 3 results) No results for input(s): "PROBNP" in the last 8760 hours. HbA1C: No results for input(s): "HGBA1C" in the last 72 hours. CBG: Recent Labs  Lab 06/05/22 1944 06/05/22 2136 06/05/22 2330 06/06/22 0356 06/06/22 0751   GLUCAP 326* 303* 325* 226* 192*   Lipid Profile: No results for input(s): "CHOL", "HDL", "LDLCALC", "TRIG", "CHOLHDL", "LDLDIRECT" in the last 72 hours. Thyroid Function Tests: Recent Labs    06/04/22 1854  TSH 1.348   Anemia Panel: Recent Labs    06/04/22 1854  VITAMINB12 226  FOLATE 11.6  FERRITIN 23  TIBC 424  IRON 37  RETICCTPCT 1.8   Urine analysis:    Component Value Date/Time   COLORURINE YELLOW 03/02/2008 1624   APPEARANCEUR CLEAR 03/02/2008 1624   LABSPEC 1.006 03/02/2008 1624   PHURINE 7.5 03/02/2008 1624   GLUCOSEU NEGATIVE 03/02/2008 1624   HGBUR NEGATIVE 03/02/2008 1624   BILIRUBINUR NEGATIVE 03/02/2008 1624   KETONESUR NEGATIVE 03/02/2008 1624   PROTEINUR NEGATIVE 03/02/2008 1624   UROBILINOGEN 0.2 03/02/2008 1624   NITRITE NEGATIVE 03/02/2008 1624   LEUKOCYTESUR  03/02/2008 1624    NEGATIVE MICROSCOPIC NOT DONE ON URINES WITH NEGATIVE PROTEIN, BLOOD, LEUKOCYTES, NITRITE, OR GLUCOSE <1000 mg/dL.   Sepsis Labs: '@LABRCNTIP'$ (procalcitonin:4,lacticidven:4)  ) Recent Results (from the past 240 hour(s))  Resp panel by RT-PCR (RSV, Flu A&B, Covid) Anterior Nasal Swab     Status: None   Collection Time: 06/04/22  6:25 PM   Specimen: Anterior Nasal Swab  Result Value Ref Range Status   SARS Coronavirus 2 by RT PCR NEGATIVE NEGATIVE Final    Comment: (NOTE) SARS-CoV-2 target nucleic acids are NOT DETECTED.  The SARS-CoV-2 RNA is generally detectable in upper respiratory specimens during the acute phase of infection. The lowest concentration of SARS-CoV-2 viral copies this assay can detect is 138 copies/mL. A negative result does not preclude SARS-Cov-2 infection and should not be used as the sole basis for treatment or other patient management decisions. A negative result may occur with  improper specimen collection/handling, submission of specimen other than nasopharyngeal swab, presence of viral mutation(s) within the areas targeted by this assay, and  inadequate number of viral copies(<138 copies/mL). A negative result must be combined with clinical observations, patient history, and epidemiological information. The expected result is Negative.  Fact Sheet for Patients:  EntrepreneurPulse.com.au  Fact Sheet for Healthcare Providers:  IncredibleEmployment.be  This test is no t yet approved or cleared by the [place] FDA and  has been authorized for detection and/or diagnosis of SARS-CoV-2 by FDA under an Emergency Use Authorization (EUA). This EUA will remain  in effect (meaning this test can be used) for the duration of the COVID-19 declaration under Section 564(b)(1) of the Act, 21 U.S.C.section 360bbb-3(b)(1), unless the authorization is terminated  or revoked sooner.       Influenza A by PCR NEGATIVE NEGATIVE Final   Influenza B by PCR NEGATIVE NEGATIVE Final    Comment: (NOTE) The Xpert Xpress SARS-CoV-2/FLU/RSV plus assay is intended as an aid in the diagnosis of influenza from Nasopharyngeal swab specimens and should not be used as a sole basis for treatment. Nasal  washings and aspirates are unacceptable for Xpert Xpress SARS-CoV-2/FLU/RSV testing.  Fact Sheet for Patients: EntrepreneurPulse.com.au  Fact Sheet for Healthcare Providers: IncredibleEmployment.be  This test is not yet approved or cleared by the [place] FDA and has been authorized for detection and/or diagnosis of SARS-CoV-2 by FDA under an Emergency Use Authorization (EUA). This EUA will remain in effect (meaning this test can be used) for the duration of the COVID-19 declaration under Section 564(b)(1) of the Act, 21 U.S.C. section 360bbb-3(b)(1), unless the authorization is terminated or revoked.     Resp Syncytial Virus by PCR NEGATIVE NEGATIVE Final    Comment: (NOTE) Fact Sheet for Patients: EntrepreneurPulse.com.au  Fact Sheet for Healthcare  Providers: IncredibleEmployment.be  This test is not yet approved or cleared by the [place] FDA and has been authorized for detection and/or diagnosis of SARS-CoV-2 by FDA under an Emergency Use Authorization (EUA). This EUA will remain in effect (meaning this test can be used) for the duration of the COVID-19 declaration under Section 564(b)(1) of the Act, 21 U.S.C. section 360bbb-3(b)(1), unless the authorization is terminated or revoked.  Performed at [facility], [place] [address], [place], [place] [address]      Radiology Studies: ECHOCARDIOGRAM COMPLETE  Result Date: 06/05/2022    ECHOCARDIOGRAM REPORT   Patient Name:   MOET MIKULSKI Date of Exam: 06/05/2022 Medical Rec #:  [id number]      Height:       63.5 in Accession #:    [id number]     Weight:       423.0 lb Date of Birth:  07-20-1960       BSA:          2.674 m Patient Age:    89 years       BP:           158/82 mmHg Patient Gender: F              HR:           50 bpm. Exam Location:  Inpatient Procedure: 2D Echo and Intracardiac Opacification Agent Indications:    Dyspnea  History:        Patient has no prior history of Echocardiogram examinations.                 Risk Factors:Current Smoker, Diabetes and Hypertension.  Sonographer:    Harvie Junior Referring Phys: [id number] ANASTASSIA DOUTOVA  Sonographer Comments: Technically difficult study due to poor echo windows and patient is obese. Image acquisition challenging due to patient body habitus. IMPRESSIONS  1. Nondiagnostic study due to poor windows.  2. Left ventricular ejection fraction, by estimation, is 55 to 60%. The left ventricle has normal function. Left ventricular endocardial border not optimally defined to evaluate regional wall motion. There is mild concentric left ventricular hypertrophy. Left ventricular diastolic parameters were normal.  3. Right ventricular systolic function was not well visualized. The right ventricular size is  not well visualized. Tricuspid regurgitation signal is inadequate for assessing PA pressure.  4. The mitral valve is grossly normal. No evidence of mitral valve regurgitation. No evidence of mitral stenosis.  5. The aortic valve was not well visualized. Aortic valve regurgitation is not visualized.  6. The inferior vena cava is dilated in size with <50% respiratory variability, suggesting right atrial pressure of 15 mmHg. Comparison(s): No prior Echocardiogram. FINDINGS  Left Ventricle: Left ventricular ejection fraction, by estimation, is 55 to 60%. The left ventricle has normal function. Left ventricular  endocardial border not optimally defined to evaluate regional wall motion. Definity contrast agent was given IV to delineate the left ventricular endocardial borders. The left ventricular internal cavity size was normal in size. There is mild concentric left ventricular hypertrophy. Left ventricular diastolic parameters were normal. Right Ventricle: The right ventricular size is not well visualized. Right vetricular wall thickness was not well visualized. Right ventricular systolic function was not well visualized. Tricuspid regurgitation signal is inadequate for assessing PA pressure. Left Atrium: Left atrial size was not well visualized. Right Atrium: Right atrial size was not well visualized. Pericardium: There is no evidence of pericardial effusion. Mitral Valve: The mitral valve is grossly normal. No evidence of mitral valve regurgitation. No evidence of mitral valve stenosis. Tricuspid Valve: The tricuspid valve is normal in structure. Tricuspid valve regurgitation is trivial. Aortic Valve: The aortic valve was not well visualized. Aortic valve regurgitation is not visualized. Aortic valve mean gradient measures 8.0 mmHg. Aortic valve peak gradient measures 15.0 mmHg. Aortic valve area, by VTI measures 2.27 cm. Pulmonic Valve: The pulmonic valve was not well visualized. Pulmonic valve regurgitation is not  visualized. Aorta: The aortic root is normal in size and structure. Venous: The inferior vena cava is dilated in size with less than 50% respiratory variability, suggesting right atrial pressure of 15 mmHg. IAS/Shunts: The atrial septum is grossly normal.  LEFT VENTRICLE PLAX 2D LVIDd:         4.10 cm   Diastology LVIDs:         3.20 cm   LV e' medial:    10.30 cm/s LV PW:         1.10 cm   LV E/e' medial:  13.2 LV IVS:        1.35 cm   LV e' lateral:   11.10 cm/s LVOT diam:     2.00 cm   LV E/e' lateral: 12.3 LV SV:         87 LV SV Index:   33 LVOT Area:     3.14 cm  RIGHT VENTRICLE RV S prime:     11.00 cm/s LEFT ATRIUM           Index LA diam:      3.05 cm 1.14 cm/m LA Vol (A4C): 64.6 ml 24.16 ml/m  AORTIC VALVE AV Area (Vmax):    2.01 cm AV Area (Vmean):   1.90 cm AV Area (VTI):     2.27 cm AV Vmax:           193.50 cm/s AV Vmean:          132.500 cm/s AV VTI:            0.384 m AV Peak Grad:      15.0 mmHg AV Mean Grad:      8.0 mmHg LVOT Vmax:         124.00 cm/s LVOT Vmean:        80.300 cm/s LVOT VTI:          0.277 m LVOT/AV VTI ratio: 0.72  AORTA Ao Root diam: 3.00 cm Ao Asc diam:  3.00 cm MITRAL VALVE MV Area (PHT): 3.27 cm     SHUNTS MV Decel Time: 232 msec     Systemic VTI:  0.28 m MV E velocity: 136.00 cm/s  Systemic Diam: 2.00 cm MV A velocity: 67.90 cm/s MV E/A ratio:  2.00 Gwyndolyn Kaufman MD Electronically signed by Gwyndolyn Kaufman MD Signature Date/Time: 06/05/2022/5:32:37 PM    Final  X-ray chest PA or AP  Result Date: 06/04/2022 CLINICAL DATA:  Hypoxia EXAM: CHEST  1 VIEW COMPARISON:  07/17/2021 FINDINGS: Single frontal view of the chest demonstrates a stable cardiac silhouette. No acute airspace disease, effusion, or pneumothorax. No acute bony abnormalities. IMPRESSION: 1. No acute intrathoracic process. Electronically Signed   By: Randa Ngo M.D.   On: 06/04/2022 17:16     Scheduled Meds:  arformoterol  15 mcg Nebulization BID   benzonatate  100 mg Oral BID    budesonide (PULMICORT) nebulizer solution  0.5 mg Nebulization BID   enoxaparin (LOVENOX) injection  40 mg Subcutaneous Q24H   gabapentin  100 mg Oral BID   guaiFENesin  1,200 mg Oral BID   insulin aspart  0-9 Units Subcutaneous Q4H   insulin aspart  5 Units Subcutaneous TID WC   insulin detemir  25 Units Subcutaneous Daily   medroxyPROGESTERone  10 mg Oral Daily   predniSONE  40 mg Oral Q breakfast   revefenacin  175 mcg Nebulization Daily   scopolamine  1 patch Transdermal Q72H   sodium chloride flush  3 mL Intravenous Q12H   Continuous Infusions:  sodium chloride Stopped (06/05/22 1930)     LOS: 2 days    Time spent: 70mn    PDomenic Polite MD [facility]   06/06/2022, 9:03 AM

## 2022-06-06 NOTE — Evaluation (Signed)
Physical Therapy Evaluation Patient Details Name: Deborah Benson MRN: [id number] DOB: [birth year] Today's Date: 06/06/2022  History of Present Illness  Pt is a 61yo female presenting with asthma/COPD exacerbation and hypoxia. Of note pt is s/p dilation and curettage on 12/26 after which she could not wean O2.  PMH: hx of ovarian cancer, DM, Gout, HTN, peripheral neuropathy (hands & feet), smoker, asthma   Clinical Impression  Patient evaluated by Physical Therapy with no further acute PT needs identified. All education has been completed and the patient has no further questions. Pt demonstrated modified independence for bed mobility and transfers, min guard for ambulation in hallway with two standing rest breaks to recover O2-pt on 3L via [place], low of 85% that quickly recovered with pursed lip breathing. See below for any follow-up Physical Therapy or equipment needs. PT is signing off. Continue mobility specialist interventions; discussed additional ambulation tasks with RN as able, RN aware. Thank you for this referral.       Recommendations for follow up therapy are one component of a multi-disciplinary discharge planning process, led by the attending physician.  Recommendations may be updated based on patient status, additional functional criteria and insurance authorization.  Follow Up Recommendations No PT follow up      Assistance Recommended at Discharge PRN  Patient can return home with the following  Help with stairs or ramp for entrance;Assist for transportation    Equipment Recommendations Rollator (4 wheels) (Pt would benefit from 4WRW to allow for increased ambulation distance and ability to rest secondary to shortness of breath.)  Recommendations for Other Services       Functional Status Assessment Patient has not had a recent decline in their functional status     Precautions / Restrictions Precautions Precautions: None Restrictions Weight Bearing Restrictions: No       Mobility  Bed Mobility Overal bed mobility: Modified Independent             General bed mobility comments: increased time    Transfers Overall transfer level: Modified independent Equipment used: None               General transfer comment: increased time    Ambulation/Gait Ambulation/Gait assistance: Min guard Gait Distance (Feet): 80 Feet Assistive device: None Gait Pattern/deviations: Wide base of support, Step-through pattern Gait velocity: decreased     General Gait Details: Pt ambulated wtih min guard, no overt LOB noted or physical assist required. Two standing rest breaks required to recover oxygen, low of 85% with quick return utilizing pursed lip breathing.  Stairs            Wheelchair Mobility    Modified Rankin (Stroke Patients Only)       Balance Overall balance assessment: Needs assistance Sitting-balance support: Feet supported, No upper extremity supported Sitting balance-Leahy Scale: Good     Standing balance support: No upper extremity supported, During functional activity Standing balance-Leahy Scale: Good                               Pertinent Vitals/Pain Pain Assessment Pain Assessment: 0-10 Pain Score: 7  Pain Location: abdomen Pain Descriptors / Indicators: Discomfort, Dull Pain Intervention(s): Monitored during session, Repositioned    Home Living Family/patient expects to be discharged to:: Private residence Living Arrangements: Alone Available Help at Discharge: Neighbor;Available PRN/intermittently Type of Home: Apartment Home Access: Stairs to enter Entrance Stairs-Rails: None (Pt walks around back of the house to  level entry door) Entrance Stairs-Number of Steps: 2   Home Layout: One level Home Equipment: None      Prior Function Prior Level of Function : Independent/Modified Independent             Mobility Comments: IND ADLs Comments: IND     Hand Dominance         Extremity/Trunk Assessment   Upper Extremity Assessment Upper Extremity Assessment: Overall WFL for tasks assessed    Lower Extremity Assessment Lower Extremity Assessment: Overall WFL for tasks assessed    Cervical / Trunk Assessment Cervical / Trunk Assessment: Kyphotic  Communication   Communication: No difficulties  Cognition Arousal/Alertness: Awake/alert Behavior During Therapy: WFL for tasks assessed/performed Overall Cognitive Status: Within Functional Limits for tasks assessed                                          General Comments General comments (skin integrity, edema, etc.): Mother and Aunt present    Exercises     Assessment/Plan    PT Assessment All further PT needs can be met in the next venue of care  PT Problem List Cardiopulmonary status limiting activity       PT Treatment Interventions      PT Goals (Current goals can be found in the Care Plan section)  Acute Rehab PT Goals Patient Stated Goal: to go home PT Goal Formulation: With patient    Frequency       Co-evaluation               AM-PAC PT "6 Clicks" Mobility  Outcome Measure Help needed turning from your back to your side while in a flat bed without using bedrails?: None Help needed moving from lying on your back to sitting on the side of a flat bed without using bedrails?: None Help needed moving to and from a bed to a chair (including a wheelchair)?: A Little Help needed standing up from a chair using your arms (e.g., wheelchair or bedside chair)?: A Little Help needed to walk in hospital room?: A Little Help needed climbing 3-5 steps with a railing? : A Little 6 Click Score: 20    End of Session Equipment Utilized During Treatment: Oxygen (3LviaNC) Activity Tolerance: Patient tolerated treatment well;No increased pain Patient left: with family/visitor present;Other (comment) (Benchseat in room) Nurse Communication: Mobility status PT Visit Diagnosis:  Difficulty in walking, not elsewhere classified (R26.2)    Time: 5615-3794 PT Time Calculation (min) (ACUTE ONLY): 14 min   Charges:   PT Evaluation $PT Eval Low Complexity: [place], PT, DPT [facility] Office: [phone] Weekend pager: [phone]  Coolidge Breeze 06/06/2022, 1:25 PM

## 2022-06-06 NOTE — Plan of Care (Signed)
  Problem: Coping: Goal: Ability to adjust to condition or change in health will improve Outcome: Progressing   Problem: Metabolic: Goal: Ability to maintain appropriate glucose levels will improve Outcome: Progressing   Problem: Skin Integrity: Goal: Risk for impaired skin integrity will decrease Outcome: Progressing   Problem: Tissue Perfusion: Goal: Adequacy of tissue perfusion will improve Outcome: Progressing   Problem: Safety: Goal: Ability to remain free from injury will improve Outcome: Progressing

## 2022-06-06 NOTE — Progress Notes (Addendum)
2 Days Post-Op Procedure(s) (LRB): PELVIC EXAM UNDER ANESTHESIA (N/A) DILATATION AND CURETTAGE (N/A) MIRENA INTRAUTERINE DEVICE (IUD) INSERTION (N/A)  Subjective: Afraid to go to sleep due to breathing issues. She has had some pelvic cramping with small clots s/p D&C with IUD. Stating she tried Bipap overnight and "the mask did not work outArrow Electronics with no nausea. Coughing intermittently. Had two BMS since she has been admitted. Asking about potential to have home health aide, RN to assist at home. Complaining of moderate mouth/nasal dryness.   Objective: Vital signs in last 24 hours: Temp:  [98 F (36.7 C)-99.8 F (37.7 C)] 98 F (36.7 C) (12/28 0358) Pulse Rate:  [53-94] 57 (12/28 0358) Resp:  [10-20] 19 (12/28 0358) BP: (121-158)/(52-82) 129/52 (12/28 0358) SpO2:  [93 %-100 %] 98 % (12/28 0358) Last BM Date : 06/05/22  Intake/Output from previous day: 12/27 0701 - 12/28 0700 In: 686 [P.O.:240; I.V.:196; IV Piggyback:250] Out: -   Physical Examination: General: alert, cooperative, and no distress Resp: scattered bilateral wheezes, breathing labored, worse when talking Cardio: regular rate and rhythm, S1, S2 normal, no murmur, click, rub or gallop Extremities: extremities normal, atraumatic, no cyanosis or edema  Labs: WBC/Hgb/Hct/Plts:  10.8/9.4/33.7/308 (12/28 3845) BUN/Cr/glu/ALT/AST/amyl/lip:  24/0.85/--/--/--/--/-- (12/28 3646)  Assessment: 61 y.o. s/p Procedure(s): PELVIC EXAM UNDER ANESTHESIA, DILATATION AND CURETTAGE, MIRENA INTRAUTERINE DEVICE (IUD) INSERTION for EIN who was admitted given respiratory status immediately post-op with admission recommended for acute respiratory failure with hypoxia and hypercarbia- moderate persistent asthma/COPD with acute exacerbation: stable. Being managed by Hospitalist Team and Pulm team.  Pain:  Pain is well-controlled on PRN medications.  Heme: Hgb 9.4 and Hct 33.7 this am. Light vaginal bleeding expected post  D&C  ID: WBC 10.8 from 9.1 yesterday am. Doxycycline ordered per hospitalist team.  CV: BP and HR stable. Continue to monitor while inpatient with vital signs.  Resp: Admitted for acute respiratory failure with hypoxia and hypercarbia- moderate persistent asthma/COPD with acute exacerbation. Currently on 2 L. On steroid taper, doxycycline, scheduled duonebs, albuterol prn.  GI:  Tolerating po: Yes.     GU: Voiding adequate amounts without difficulty. Creatinine 0.85 this am  FEN: No critical values on am Cmet. K+ at 5.7  Endo: Diabetes mellitus Type II, under fair control.  CBG: CBG (last 3)  Recent Labs    06/05/22 2136 06/05/22 2330 06/06/22 0356  GLUCAP 303* 325* 226*     Prophylaxis: Lovenox ordered.  Plan: Continue plan of care per the Hospitalist and Pulmonary team Add humidification to oxygen RN to notify case management team to explore home health options   LOS: 2 days    Deborah Benson 06/06/2022, 7:00 AM

## 2022-06-06 NOTE — Plan of Care (Signed)
  Problem: Metabolic: Goal: Ability to maintain appropriate glucose levels will improve Outcome: Progressing   Problem: Nutritional: Goal: Maintenance of adequate nutrition will improve Outcome: Progressing Goal: Progress toward achieving an optimal weight will improve Outcome: Progressing   Problem: Tissue Perfusion: Goal: Adequacy of tissue perfusion will improve Outcome: Progressing

## 2022-06-06 NOTE — Progress Notes (Signed)
PT refused BiPAP 

## 2022-06-07 DIAGNOSIS — J4541 Moderate persistent asthma with (acute) exacerbation: Secondary | ICD-10-CM | POA: Diagnosis not present

## 2022-06-07 LAB — GLUCOSE, CAPILLARY
Glucose-Capillary: 131 mg/dL — ABNORMAL HIGH (ref 70–99)
Glucose-Capillary: 143 mg/dL — ABNORMAL HIGH (ref 70–99)
Glucose-Capillary: 166 mg/dL — ABNORMAL HIGH (ref 70–99)
Glucose-Capillary: 181 mg/dL — ABNORMAL HIGH (ref 70–99)
Glucose-Capillary: 215 mg/dL — ABNORMAL HIGH (ref 70–99)
Glucose-Capillary: 226 mg/dL — ABNORMAL HIGH (ref 70–99)

## 2022-06-07 LAB — BASIC METABOLIC PANEL
Anion gap: 4 — ABNORMAL LOW (ref 5–15)
BUN: 22 mg/dL — ABNORMAL HIGH (ref 6–20)
CO2: 31 mmol/L (ref 22–32)
Calcium: 8.8 mg/dL — ABNORMAL LOW (ref 8.9–10.3)
Chloride: 100 mmol/L (ref 98–111)
Creatinine, Ser: 0.9 mg/dL (ref 0.44–1.00)
GFR, Estimated: >60 mL/min (ref >60)
Glucose, Bld: 160 mg/dL — ABNORMAL HIGH (ref 70–99)
Potassium: 5.5 mmol/L — ABNORMAL HIGH (ref 3.5–5.1)
Sodium: 135 mmol/L (ref 135–145)

## 2022-06-07 LAB — CBC
HCT: 33.9 % — ABNORMAL LOW (ref 36.0–46.0)
Hemoglobin: 9.4 g/dL — ABNORMAL LOW (ref 12.0–15.0)
MCH: 23.3 pg — ABNORMAL LOW (ref 26.0–34.0)
MCHC: 27.7 g/dL — ABNORMAL LOW (ref 30.0–36.0)
MCV: 83.9 fL (ref 80.0–100.0)
Platelets: 293 10*3/uL (ref 150–400)
RBC: 4.04 MIL/uL (ref 3.87–5.11)
RDW: 19.9 % — ABNORMAL HIGH (ref 11.5–15.5)
WBC: 12.5 10*3/uL — ABNORMAL HIGH (ref 4.0–10.5)
nRBC: 0.2 % (ref 0.0–0.2)

## 2022-06-07 MED ORDER — SODIUM ZIRCONIUM CYCLOSILICATE 10 G PO PACK
10.0000 g | PACK | Freq: Two times a day (BID) | ORAL | Status: AC
  Administered 2022-06-07 (×2): 10 g via ORAL
  Filled 2022-06-07 (×2): qty 1

## 2022-06-07 MED ORDER — FUROSEMIDE 10 MG/ML IJ SOLN
40.0000 mg | Freq: Once | INTRAMUSCULAR | Status: AC
  Administered 2022-06-07: 40 mg via INTRAVENOUS
  Filled 2022-06-07: qty 4

## 2022-06-07 MED ORDER — PREDNISONE 20 MG PO TABS
30.0000 mg | ORAL_TABLET | Freq: Every day | ORAL | Status: DC
  Administered 2022-06-07 – 2022-06-08 (×2): 30 mg via ORAL
  Filled 2022-06-07 (×2): qty 1

## 2022-06-07 MED ORDER — INSULIN DETEMIR 100 UNIT/ML ~~LOC~~ SOLN
30.0000 [IU] | Freq: Every day | SUBCUTANEOUS | Status: DC
  Administered 2022-06-07: 30 [IU] via SUBCUTANEOUS
  Filled 2022-06-07 (×2): qty 0.3

## 2022-06-07 NOTE — Progress Notes (Signed)
Pt refused bipap again tonight. Machine remained bedside if needed.

## 2022-06-07 NOTE — Progress Notes (Signed)
3 Days Post-Op Procedure(s) (LRB): PELVIC EXAM UNDER ANESTHESIA (N/A) DILATATION AND CURETTAGE (N/A) MIRENA INTRAUTERINE DEVICE (IUD) INSERTION (N/A)  Subjective: Patient feels like she is choking when eating due to respiratory issues. Having lower pelvic cramping. Uterine bleeding has slowed. Had BM yesterday. Incontinent of urine with coughing. Feels fine when walking. Asking if she can have cough syrup since she feels the pills she is receiving is drying her out.   Objective: Vital signs in last 24 hours: Temp:  [98.1 F (36.7 C)-98.3 F (36.8 C)] 98.3 F (36.8 C) (12/29 0423) Pulse Rate:  [54-64] 64 (12/29 0423) Resp:  [16-20] 20 (12/29 0423) BP: (141-157)/(68-78) 141/78 (12/29 0423) SpO2:  [96 %-99 %] 99 % (12/29 0423) Last BM Date : 06/05/22  Intake/Output from previous day: 12/28 0701 - 12/29 0700 In: 723 [P.O.:720; I.V.:3] Out: -   Physical Examination: General: alert, cooperative, and no distress Resp: audible expiratory bilateral wheezes, breathing labored, worse when talking Cardio: regular rate and rhythm, S1, S2 normal, no murmur, click, rub or gallop Extremities: extremities normal, atraumatic, no cyanosis or edema  Labs: WBC/Hgb/Hct/Plts:  12.5/9.4/33.9/293 (12/29 0447) BUN/Cr/glu/ALT/AST/amyl/lip:  22/0.90/--/--/--/--/-- (12/29 0447)  Assessment: 61 y.o. s/p Procedure(s): PELVIC EXAM UNDER ANESTHESIA, DILATATION AND CURETTAGE, MIRENA INTRAUTERINE DEVICE (IUD) INSERTION for EIN who was admitted given respiratory status immediately post-op with admission recommended for acute respiratory failure with hypoxia and hypercarbia- moderate persistent asthma/COPD with acute exacerbation: stable. Being managed by Hospitalist Team and Pulm team.  Pain:  Pain is well-controlled on PRN medications.  Heme: Hgb 9.4 and Hct 33.9 this am. Light vaginal bleeding expected post D&C and resolving.  ID: WBC 12.5 this am from 10.8 yesterday am. Doxycycline initially ordered per  hospitalist team on admission, currently not ordered.  CV: BP and HR stable. Continue to monitor while inpatient with vital signs.  Resp: Admitted for acute respiratory failure with hypoxia and hypercarbia- moderate persistent asthma/COPD with acute exacerbation. Currently on 2 L. On steroid taper, doxycycline, scheduled duonebs, albuterol prn.  GI:  Tolerating po: Yes but limited due to feeling of choking, decreased oxygen intake during eating.     GU: Voiding adequate amounts without difficulty. Creatinine 0.90 this am  FEN: No critical values on am Cmet. K+ at 5.5  Endo: Diabetes mellitus Type II, under fair control.  CBG: CBG (last 3)  Recent Labs    06/06/22 1959 06/06/22 2355 06/07/22 0419  GLUCAP 305* 181* 226*     Prophylaxis: Lovenox ordered.  Plan: Continue plan of care per the Hospitalist and Pulmonary team   LOS: 3 days    Deborah Benson 06/07/2022, 7:05 AM

## 2022-06-07 NOTE — Progress Notes (Signed)
PROGRESS NOTE    Deborah Benson  NWG:[id number] DOB: [birth year] DOA: 06/04/2022 PCP: Lind Covert, MD  61 y/o F who presented to [facility] on 12/26 for planned DIC in the setting of endometrial lesion.   -h/o pneumonia a month prior to admit. She notes she has been having wheezing, cough and shortness of breath since that time.  She is a known smoker.    She underwent planned dilation and curettage, with Mirena intrauterine device insertion for endometrial intraepithelial neoplasia. Intra-op she had respiratory congestion and required oxygen. She was placed on bipap post-operatively for mild hypercarbia / acute respiratory acidosis.   Subjective: -Choking when she tried to eat earlier today some cough and wheezing, uterine bleeding has slowed  Assessment and Plan:  Acute Hypoxic and hypercarbic Respiratory Failure  OSA/OHS Tobacco Abuse  Atelectasis, postop -Appreciate pulmonary input,, pulmonary toileting -Continue Brovana, Pulmicort, DuoNebs, Mucinex  -Slow improvement, continue prednisone taper, incentive spirometry, flutter valve -Weaned off O2 at rest today, check ambulatory O2 sats -IV Lasix x 1 -Discussed with pulmonary they will arrange follow-up for sleep study  Endometrial intraepithelial neoplasia Pelvic exam under anesthesia, D&C, IUD insertion -Per GYN  Morbid obesity, BMI> 70  Type 2 diabetes mellitus -Follow-up HbA1c, continue sliding scale insulin -CBGs elevated in the setting of steroids, increase Levemir dose, continue meal coverage  Hyperkalemia -Likely secondary to steroids, repeat Lokelma today  Acute on chronic anemia -Anemia panel with iron deficiency, given IV iron 12/28  DVT prophylaxis: SCDs Code Status: DNR Family Communication: None present Disposition Plan: Home likely tomorrow  Consultants: Pulm, GYN   Procedures:   Antimicrobials:    Objective: Vitals:   06/06/22 2116 06/06/22 2139 06/07/22 0423 06/07/22 0804  BP: (!) 157/68   (!) 141/78   Pulse: (!) 54  64   Resp: 17  20   Temp: 98.2 F (36.8 C)  98.3 F (36.8 C)   TempSrc: Oral  Oral   SpO2: 97% 96% 99% 98%  Weight:      Height:        Intake/Output Summary (Last 24 hours) at 06/07/2022 1305 Last data filed at 06/06/2022 2000 Gross per 24 hour  Intake 480 ml  Output --  Net 480 ml   Filed Weights   06/04/22 1047  Weight: (!) 191.9 kg    Examination:  General exam: Morbidly obese chronically ill female sitting up, AAOx3, no distress HEENT: Neck obese unable to assess JVD CVS: S1-S2, regular rhythm Lungs: Decreased distant breath sounds, few scattered rhonchi Abdomen: Soft, obese, nontender, bowel sounds present  Extremities: No edema,  chronic skin changes in both lower legs Psychiatry:  Mood & affect appropriate.     Data Reviewed:   CBC: Recent Labs  Lab 06/04/22 1854 06/05/22 0607 06/06/22 0511 06/07/22 0447  WBC 11.4* 9.1 10.8* 12.5*  NEUTROABS 10.7*  --   --   --   HGB 10.5* 9.7* 9.4* 9.4*  HCT 36.8 34.8* 33.7* 33.9*  MCV 82.3 83.5 83.8 83.9  PLT 267 276 308 578   Basic Metabolic Panel: Recent Labs  Lab 06/04/22 1854 06/05/22 0607 06/06/22 0511 06/07/22 0447  NA 135 137 137 135  K 5.0 5.5* 5.7* 5.5*  CL 100 104 104 100  CO2 '27 29 29 31  '$ GLUCOSE 197* 230* 215* 160*  BUN 8 12 24* 22*  CREATININE 0.82 0.77 0.85 0.90  CALCIUM 8.3* 8.5* 8.6* 8.8*  MG 2.3 2.7*  --   --   PHOS 3.6 3.3  --   --  GFR: Estimated Creatinine Clearance: 114.3 mL/min (by C-G formula based on SCr of 0.9 mg/dL). Liver Function Tests: Recent Labs  Lab 06/04/22 1854 06/05/22 0607  AST 22 16  ALT 17 14  ALKPHOS 75 74  BILITOT 0.5 0.3  PROT 9.0* 8.6*  ALBUMIN 3.3* 3.2*   No results for input(s): "LIPASE", "AMYLASE" in the last 168 hours. No results for input(s): "AMMONIA" in the last 168 hours. Coagulation Profile: No results for input(s): "INR", "PROTIME" in the last 168 hours. Cardiac Enzymes: Recent Labs  Lab 06/04/22 1854   CKTOTAL 128   BNP (last 3 results) No results for input(s): "PROBNP" in the last 8760 hours. HbA1C: No results for input(s): "HGBA1C" in the last 72 hours. CBG: Recent Labs  Lab 06/06/22 1959 06/06/22 2355 06/07/22 0419 06/07/22 0746 06/07/22 1243  GLUCAP 305* 181* 226* 131* 143*   Lipid Profile: No results for input(s): "CHOL", "HDL", "LDLCALC", "TRIG", "CHOLHDL", "LDLDIRECT" in the last 72 hours. Thyroid Function Tests: Recent Labs    06/04/22 1854  TSH 1.348   Anemia Panel: Recent Labs    06/04/22 1854  VITAMINB12 226  FOLATE 11.6  FERRITIN 23  TIBC 424  IRON 37  RETICCTPCT 1.8   Urine analysis:    Component Value Date/Time   COLORURINE YELLOW 03/02/2008 1624   APPEARANCEUR CLEAR 03/02/2008 1624   LABSPEC 1.006 03/02/2008 1624   PHURINE 7.5 03/02/2008 1624   GLUCOSEU NEGATIVE 03/02/2008 1624   HGBUR NEGATIVE 03/02/2008 1624   BILIRUBINUR NEGATIVE 03/02/2008 1624   KETONESUR NEGATIVE 03/02/2008 1624   PROTEINUR NEGATIVE 03/02/2008 1624   UROBILINOGEN 0.2 03/02/2008 1624   NITRITE NEGATIVE 03/02/2008 1624   LEUKOCYTESUR  03/02/2008 1624    NEGATIVE MICROSCOPIC NOT DONE ON URINES WITH NEGATIVE PROTEIN, BLOOD, LEUKOCYTES, NITRITE, OR GLUCOSE <1000 mg/dL.   Sepsis Labs: '@LABRCNTIP'$ (procalcitonin:4,lacticidven:4)  ) Recent Results (from the past 240 hour(s))  Resp panel by RT-PCR (RSV, Flu A&B, Covid) Anterior Nasal Swab     Status: None   Collection Time: 06/04/22  6:25 PM   Specimen: Anterior Nasal Swab  Result Value Ref Range Status   SARS Coronavirus 2 by RT PCR NEGATIVE NEGATIVE Final    Comment: (NOTE) SARS-CoV-2 target nucleic acids are NOT DETECTED.  The SARS-CoV-2 RNA is generally detectable in upper respiratory specimens during the acute phase of infection. The lowest concentration of SARS-CoV-2 viral copies this assay can detect is 138 copies/mL. A negative result does not preclude SARS-Cov-2 infection and should not be used as the sole  basis for treatment or other patient management decisions. A negative result may occur with  improper specimen collection/handling, submission of specimen other than nasopharyngeal swab, presence of viral mutation(s) within the areas targeted by this assay, and inadequate number of viral copies(<138 copies/mL). A negative result must be combined with clinical observations, patient history, and epidemiological information. The expected result is Negative.  Fact Sheet for Patients:  EntrepreneurPulse.com.au  Fact Sheet for Healthcare Providers:  IncredibleEmployment.be  This test is no t yet approved or cleared by the [place] FDA and  has been authorized for detection and/or diagnosis of SARS-CoV-2 by FDA under an Emergency Use Authorization (EUA). This EUA will remain  in effect (meaning this test can be used) for the duration of the COVID-19 declaration under Section 564(b)(1) of the Act, 21 U.S.C.section 360bbb-3(b)(1), unless the authorization is terminated  or revoked sooner.       Influenza A by PCR NEGATIVE NEGATIVE Final   Influenza B by PCR  NEGATIVE NEGATIVE Final    Comment: (NOTE) The Xpert Xpress SARS-CoV-2/FLU/RSV plus assay is intended as an aid in the diagnosis of influenza from Nasopharyngeal swab specimens and should not be used as a sole basis for treatment. Nasal washings and aspirates are unacceptable for Xpert Xpress SARS-CoV-2/FLU/RSV testing.  Fact Sheet for Patients: EntrepreneurPulse.com.au  Fact Sheet for Healthcare Providers: IncredibleEmployment.be  This test is not yet approved or cleared by the [place] FDA and has been authorized for detection and/or diagnosis of SARS-CoV-2 by FDA under an Emergency Use Authorization (EUA). This EUA will remain in effect (meaning this test can be used) for the duration of the COVID-19 declaration under Section 564(b)(1) of the Act,  21 U.S.C. section 360bbb-3(b)(1), unless the authorization is terminated or revoked.     Resp Syncytial Virus by PCR NEGATIVE NEGATIVE Final    Comment: (NOTE) Fact Sheet for Patients: EntrepreneurPulse.com.au  Fact Sheet for Healthcare Providers: IncredibleEmployment.be  This test is not yet approved or cleared by the [place] FDA and has been authorized for detection and/or diagnosis of SARS-CoV-2 by FDA under an Emergency Use Authorization (EUA). This EUA will remain in effect (meaning this test can be used) for the duration of the COVID-19 declaration under Section 564(b)(1) of the Act, 21 U.S.C. section 360bbb-3(b)(1), unless the authorization is terminated or revoked.  Performed at [facility], [place] [address] [place],  [address]      Radiology Studies: ECHOCARDIOGRAM COMPLETE  Result Date: 06/05/2022    ECHOCARDIOGRAM REPORT   Patient Name:   Deborah Benson Date of Exam: 06/05/2022 Medical Rec #:  [id number]      Height:       63.5 in Accession #:    [id number]     Weight:       423.0 lb Date of Birth:  07-25-1960       BSA:          2.674 m Patient Age:    38 years       BP:           158/82 mmHg Patient Gender: F              HR:           50 bpm. Exam Location:  Inpatient Procedure: 2D Echo and Intracardiac Opacification Agent Indications:    Dyspnea  History:        Patient has no prior history of Echocardiogram examinations.                 Risk Factors:Current Smoker, Diabetes and Hypertension.  Sonographer:    Harvie Junior Referring Phys: [id number] ANASTASSIA DOUTOVA  Sonographer Comments: Technically difficult study due to poor echo windows and patient is obese. Image acquisition challenging due to patient body habitus. IMPRESSIONS  1. Nondiagnostic study due to poor windows.  2. Left ventricular ejection fraction, by estimation, is 55 to 60%. The left ventricle has normal function. Left ventricular endocardial  border not optimally defined to evaluate regional wall motion. There is mild concentric left ventricular hypertrophy. Left ventricular diastolic parameters were normal.  3. Right ventricular systolic function was not well visualized. The right ventricular size is not well visualized. Tricuspid regurgitation signal is inadequate for assessing PA pressure.  4. The mitral valve is grossly normal. No evidence of mitral valve regurgitation. No evidence of mitral stenosis.  5. The aortic valve was not well visualized. Aortic valve regurgitation is not visualized.  6. The inferior vena cava is  dilated in size with <50% respiratory variability, suggesting right atrial pressure of 15 mmHg. Comparison(s): No prior Echocardiogram. FINDINGS  Left Ventricle: Left ventricular ejection fraction, by estimation, is 55 to 60%. The left ventricle has normal function. Left ventricular endocardial border not optimally defined to evaluate regional wall motion. Definity contrast agent was given IV to delineate the left ventricular endocardial borders. The left ventricular internal cavity size was normal in size. There is mild concentric left ventricular hypertrophy. Left ventricular diastolic parameters were normal. Right Ventricle: The right ventricular size is not well visualized. Right vetricular wall thickness was not well visualized. Right ventricular systolic function was not well visualized. Tricuspid regurgitation signal is inadequate for assessing PA pressure. Left Atrium: Left atrial size was not well visualized. Right Atrium: Right atrial size was not well visualized. Pericardium: There is no evidence of pericardial effusion. Mitral Valve: The mitral valve is grossly normal. No evidence of mitral valve regurgitation. No evidence of mitral valve stenosis. Tricuspid Valve: The tricuspid valve is normal in structure. Tricuspid valve regurgitation is trivial. Aortic Valve: The aortic valve was not well visualized. Aortic valve  regurgitation is not visualized. Aortic valve mean gradient measures 8.0 mmHg. Aortic valve peak gradient measures 15.0 mmHg. Aortic valve area, by VTI measures 2.27 cm. Pulmonic Valve: The pulmonic valve was not well visualized. Pulmonic valve regurgitation is not visualized. Aorta: The aortic root is normal in size and structure. Venous: The inferior vena cava is dilated in size with less than 50% respiratory variability, suggesting right atrial pressure of 15 mmHg. IAS/Shunts: The atrial septum is grossly normal.  LEFT VENTRICLE PLAX 2D LVIDd:         4.10 cm   Diastology LVIDs:         3.20 cm   LV e' medial:    10.30 cm/s LV PW:         1.10 cm   LV E/e' medial:  13.2 LV IVS:        1.35 cm   LV e' lateral:   11.10 cm/s LVOT diam:     2.00 cm   LV E/e' lateral: 12.3 LV SV:         87 LV SV Index:   33 LVOT Area:     3.14 cm  RIGHT VENTRICLE RV S prime:     11.00 cm/s LEFT ATRIUM           Index LA diam:      3.05 cm 1.14 cm/m LA Vol (A4C): 64.6 ml 24.16 ml/m  AORTIC VALVE AV Area (Vmax):    2.01 cm AV Area (Vmean):   1.90 cm AV Area (VTI):     2.27 cm AV Vmax:           193.50 cm/s AV Vmean:          132.500 cm/s AV VTI:            0.384 m AV Peak Grad:      15.0 mmHg AV Mean Grad:      8.0 mmHg LVOT Vmax:         124.00 cm/s LVOT Vmean:        80.300 cm/s LVOT VTI:          0.277 m LVOT/AV VTI ratio: 0.72  AORTA Ao Root diam: 3.00 cm Ao Asc diam:  3.00 cm MITRAL VALVE MV Area (PHT): 3.27 cm     SHUNTS MV Decel Time: 232 msec     Systemic VTI:  0.28 m MV E velocity: 136.00 cm/s  Systemic Diam: 2.00 cm MV A velocity: 67.90 cm/s MV E/A ratio:  2.00 Gwyndolyn Kaufman MD Electronically signed by Gwyndolyn Kaufman MD Signature Date/Time: 06/05/2022/5:32:37 PM    Final      Scheduled Meds:  arformoterol  15 mcg Nebulization BID   benzonatate  100 mg Oral BID   budesonide (PULMICORT) nebulizer solution  0.5 mg Nebulization BID   enoxaparin (LOVENOX) injection  100 mg Subcutaneous Q24H   gabapentin  100  mg Oral BID   guaiFENesin  1,200 mg Oral BID   insulin aspart  0-9 Units Subcutaneous Q4H   insulin aspart  5 Units Subcutaneous TID WC   insulin detemir  30 Units Subcutaneous Daily   medroxyPROGESTERone  10 mg Oral Daily   predniSONE  30 mg Oral Q breakfast   revefenacin  175 mcg Nebulization Daily   scopolamine  1 patch Transdermal Q72H   sodium chloride flush  3 mL Intravenous Q12H   sodium zirconium cyclosilicate  10 g Oral BID   Continuous Infusions:  sodium chloride Stopped (06/05/22 1930)     LOS: 3 days    Time spent: 2mn    PDomenic Polite MD [facility]   06/07/2022, 1:05 PM

## 2022-06-07 NOTE — Plan of Care (Signed)
  Problem: Education: Goal: Ability to describe self-care measures that may prevent or decrease complications (Diabetes Survival Skills Education) will improve Outcome: Progressing   Problem: Fluid Volume: Goal: Ability to maintain a balanced intake and output will improve Outcome: Progressing   Problem: Metabolic: Goal: Ability to maintain appropriate glucose levels will improve Outcome: Progressing

## 2022-06-08 DIAGNOSIS — J4541 Moderate persistent asthma with (acute) exacerbation: Secondary | ICD-10-CM | POA: Diagnosis not present

## 2022-06-08 LAB — CBC
HCT: 30.7 % — ABNORMAL LOW (ref 36.0–46.0)
Hemoglobin: 8.6 g/dL — ABNORMAL LOW (ref 12.0–15.0)
MCH: 23.2 pg — ABNORMAL LOW (ref 26.0–34.0)
MCHC: 28 g/dL — ABNORMAL LOW (ref 30.0–36.0)
MCV: 82.7 fL (ref 80.0–100.0)
Platelets: 259 10*3/uL (ref 150–400)
RBC: 3.71 MIL/uL — ABNORMAL LOW (ref 3.87–5.11)
RDW: 19.4 % — ABNORMAL HIGH (ref 11.5–15.5)
WBC: 11.3 10*3/uL — ABNORMAL HIGH (ref 4.0–10.5)
nRBC: 0.2 % (ref 0.0–0.2)

## 2022-06-08 LAB — GLUCOSE, CAPILLARY
Glucose-Capillary: 104 mg/dL — ABNORMAL HIGH (ref 70–99)
Glucose-Capillary: 117 mg/dL — ABNORMAL HIGH (ref 70–99)
Glucose-Capillary: 138 mg/dL — ABNORMAL HIGH (ref 70–99)

## 2022-06-08 LAB — BASIC METABOLIC PANEL
Anion gap: 4 — ABNORMAL LOW (ref 5–15)
BUN: 21 mg/dL — ABNORMAL HIGH (ref 6–20)
CO2: 34 mmol/L — ABNORMAL HIGH (ref 22–32)
Calcium: 8.7 mg/dL — ABNORMAL LOW (ref 8.9–10.3)
Chloride: 100 mmol/L (ref 98–111)
Creatinine, Ser: 0.85 mg/dL (ref 0.44–1.00)
GFR, Estimated: >60 mL/min (ref >60)
Glucose, Bld: 108 mg/dL — ABNORMAL HIGH (ref 70–99)
Potassium: 4.3 mmol/L (ref 3.5–5.1)
Sodium: 138 mmol/L (ref 135–145)

## 2022-06-08 MED ORDER — BENZONATATE 100 MG PO CAPS: 100.0000 mg | ORAL_CAPSULE | Freq: Two times a day (BID) | ORAL | 0 refills | Status: AC

## 2022-06-08 MED ORDER — PREDNISONE 10 MG PO TABS: 20.0000 mg | ORAL_TABLET | Freq: Every day | ORAL | 0 refills | Status: AC

## 2022-06-08 MED ORDER — PREDNISONE 10 MG PO TABS: 20.0000 mg | ORAL_TABLET | Freq: Every day | ORAL | 0 refills | Status: DC

## 2022-06-08 NOTE — Progress Notes (Signed)
4 Days Post-Op Procedure(s) (LRB): PELVIC EXAM UNDER ANESTHESIA (N/A) DILATATION AND CURETTAGE (N/A) MIRENA INTRAUTERINE DEVICE (IUD) INSERTION (N/A)  Subjective: Patient reports breathing mildly improved. Still has choking sensation sometimes when she coughs. Continued pelvic cramping. Bleeding has decreased some. Voiding without difficulty. + bowel movement yesterday.  Objective: Vital signs in last 24 hours: Temp:  [98.2 F (36.8 C)-98.6 F (37 C)] 98.6 F (37 C) (12/30 0434) Pulse Rate:  [52-64] 64 (12/30 0434) Resp:  [16-17] 17 (12/30 0434) BP: (138-142)/(65-72) 142/65 (12/30 0434) SpO2:  [95 %-100 %] 100 % (12/30 0434) Last BM Date : 06/07/22  Intake/Output from previous day: 12/29 0701 - 12/30 0700 In: -  Out: 2050 [Urine:2050]  Physical Examination: General: Alert, oriented, no acute distress.  HEENT: Normocephalic, atraumatic. Sclera anicteric.  Chest: Significant inspiratory and expiratory wheezing, breathing appears more comfortable than yesterday on 2L of oxygen by n.c. Cardiovascular: regular rate and rhythm, no murmurs, rubs, or gallops.  Abdomen: soft, nondistended, obese, + bowel sounds Extremities: Grossly normal range of motion. Warm, well perfused. Trace edema bilaterally.   Labs: Pathology: A. 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  See comment.   COMMENT:   Dr. Saralyn Pilar has reviewed the case and concurs with the diagnosis.  Dr.  Romona Curls office was telephoned at 12:45 p.m. on 06/07/2022.      Latest Ref Rng & Units 06/08/2022    5:35 AM 06/07/2022    4:47 AM 06/06/2022    5:11 AM  CBC  WBC 4.0 - 10.5 K/uL 11.3  12.5  10.8   Hemoglobin 12.0 - 15.0 g/dL 8.6  9.4  9.4   Hematocrit 36.0 - 46.0 % 30.7  33.9  33.7   Platelets 150 - 400 K/uL 259  293  308       Latest Ref Rng & Units 06/08/2022    5:35  AM 06/07/2022    4:47 AM 06/06/2022    5:11 AM  BMP  Glucose 70 - 99 mg/dL 108  160  215   BUN 6 - 20 mg/dL '21  22  24   '$ Creatinine 0.44 - 1.00 mg/dL 0.85  0.90  0.85   Sodium 135 - 145 mmol/L 138  135  137   Potassium 3.5 - 5.1 mmol/L 4.3  5.5  5.7   Chloride 98 - 111 mmol/L 100  100  104   CO2 22 - 32 mmol/L 34  31  29   Calcium 8.9 - 10.3 mg/dL 8.7  8.8  8.6    Assessment:  61 y.o. s/p Procedure(s): PELVIC EXAM UNDER ANESTHESIA DILATATION AND CURETTAGE MIRENA INTRAUTERINE DEVICE (IUD) INSERTION: developed acute hypoxia in the setting of underlying lung disease after D&C procedure.  Post-op: meeting milestones. Discussed cramping related to procedure which should continue to improve. Bleeding improving after procedure, continue to monitor H&H while inpatient.  Endometrial cancer: pathology from recent procedure shows low grade endometrioid adenocarcinoma in background of extensive EIN. Discussed this with the patient today and how progesterone secreting IUD acts as treatment for this disease. The patient has follow-up with Dr. Ernestina Patches in mid-January.  Acute exacerbation of underlying asthma/COPD: appreciate medicine and pulmonary recommendations and management. Patient was able to be weaned off oxygen for portion of yesterday.     LOS: 4 days    Lafonda Mosses 06/08/2022, 7:05 AM

## 2022-06-08 NOTE — Discharge Summary (Signed)
Physician Discharge Summary  Marquerite Forsman ZOX:[id number] DOB: 1961/02/04 DOA: 06/04/2022  PCP: Lind Covert, MD  Admit date: 06/04/2022 Discharge date: 06/08/2022  Time spent:45 minutes  Recommendations for Outpatient Follow-up:  Pulmonary follow-up arranged, needs sleep study PCP in 1 week GYN in 1 month   Discharge Diagnoses:  Principal Problem:   Asthma exacerbation Active Problems:   DM2 (diabetes mellitus, type 2) ([place])   TOBACCO USE   BMI 70 and over, adult ([place])   EIN (endometrial intraepithelial neoplasia)   COPD with acute exacerbation ([place])   Acute respiratory failure with hypoxia and hypercapnia (H[facility])   Anemia   Hypoxia   Discharge Condition: Stable  Diet recommendation: Low-sodium, diabetic  Filed Weights   06/04/22 1047  Weight: (!) 191.9 kg    History of present illness:  61 y/o F who presented to [facility] on 12/26 for planned DIC in the setting of endometrial lesion.   -h/o pneumonia a month prior to admit. She notes she has been having wheezing, cough and shortness of breath since that time.  She is a known smoker.    She underwent planned dilation and curettage, with Mirena intrauterine device insertion for endometrial intraepithelial neoplasia. Intra-op she had respiratory congestion and required oxygen. She was placed on bipap post-operatively for mild hypercarbia / acute respiratory acidosis.  Hospital Course:   Acute Hypoxic and hypercarbic Respiratory Failure  OSA/OHS Tobacco Abuse  Atelectasis, postop -She presented to [facility] for GYN surgery, postop course complicated by hypoxia and she was subsequently admitted from PACU  -appreciate pulmonary input, treated with IV steroids, Brovana, Pulmicort, DuoNebs, Mucinex, incentive spirometry and flutter valve -Weaned off of 2 addressed -Discussed with pulmonary they will arrange follow-up for sleep study -Transitioned to prednisone taper at discharge -Needs to lose a lot of  weight   Endometrial intraepithelial neoplasia Pelvic exam under anesthesia, D&C, IUD insertion -Follow-up with GYN in 2 weeks   Morbid obesity, BMI> 70   Type 2 diabetes mellitus -Resume Trulicity, needs to lose weight   Hyperkalemia -Likely secondary to steroids, resolved   Acute on chronic anemia -Anemia panel with iron deficiency, given IV iron 12/28  Discharge Exam: Vitals:   06/08/22 0800 06/08/22 0841  BP:    Pulse:    Resp:    Temp:    SpO2: 100% 99%    General exam: Morbidly obese chronically ill female sitting up, AAOx3, no distress HEENT: Neck obese unable to assess JVD CVS: S1-S2, regular rhythm Lungs: Decreased distant breath sounds, few scattered rhonchi Abdomen: Soft, obese, nontender, bowel sounds present  Extremities: No edema,  chronic skin changes in both lower legs Psychiatry:  Mood & affect appropriate.   Discharge Instructions   Discharge Instructions     Diet - low sodium heart healthy   Complete by: As directed    Diet Carb Modified   Complete by: As directed    Increase activity slowly   Complete by: As directed    Increase activity slowly   Complete by: As directed       Allergies as of 06/08/2022       Reactions   Penicillins Hives   Denies airway involvement        Medication List     STOP taking these medications    metroNIDAZOLE 500 MG tablet Commonly known as: FLAGYL   naproxen sodium 220 MG tablet Commonly known as: ALEVE       TAKE these medications    benzonatate 100 MG  capsule Commonly known as: TESSALON Take 1 capsule (100 mg total) by mouth 2 (two) times daily for 5 days.   gabapentin 100 MG capsule Commonly known as: NEURONTIN Take 1 capsule (100 mg total) by mouth 3 (three) times daily. What changed: when to take this   medroxyPROGESTERone 10 MG tablet Commonly known as: PROVERA Take 1 tablet (10 mg total) by mouth in the morning and at bedtime.   metFORMIN 500 MG 24 hr tablet Commonly  known as: GLUCOPHAGE-XR TAKE 1 TABLET(500 MG) BY MOUTH IN THE MORNING AND AT BEDTIME What changed: See the new instructions.   nystatin powder Commonly known as: MYCOSTATIN/NYSTOP Apply 1 Application topically 3 (three) times daily as needed. What changed: when to take this   predniSONE 10 MG tablet Commonly known as: DELTASONE Take 2 tablets (20 mg total) by mouth daily with breakfast for 3 days. Start taking on: June 09, 2022   ProAir HFA 108 (90 Base) MCG/ACT inhaler Generic drug: albuterol INHALE 2 PUFFS BY MOUTH EVERY 6 HOURS AS NEEDED FOR SHORTNESS OF BREATH. PLEASE MAKE AN APPOINTMENT   albuterol (2.5 MG/3ML) 0.083% nebulizer solution Commonly known as: PROVENTIL Take 3 mLs (2.5 mg total) by nebulization every 6 (six) hours as needed for wheezing or shortness of breath.   Trulicity 4.13 KG/4.0NU Sopn Generic drug: Dulaglutide ADMINISTER 0.75 MG UNDER THE SKIN 1 TIME A WEEK       Allergies  Allergen Reactions   Penicillins Hives    Denies airway involvement      The results of significant diagnostics from this hospitalization (including imaging, microbiology, ancillary and laboratory) are listed below for reference.    Significant Diagnostic Studies: ECHOCARDIOGRAM COMPLETE  Result Date: 06/05/2022    ECHOCARDIOGRAM REPORT   Patient Name:   TIMIYAH ROMITO Date of Exam: 06/05/2022 Medical Rec #:  [id number]      Height:       63.5 in Accession #:    [id number]     Weight:       423.0 lb Date of Birth:  Jun 12, 1960       BSA:          2.674 m Patient Age:    7 years       BP:           158/82 mmHg Patient Gender: F              HR:           50 bpm. Exam Location:  Inpatient Procedure: 2D Echo and Intracardiac Opacification Agent Indications:    Dyspnea  History:        Patient has no prior history of Echocardiogram examinations.                 Risk Factors:Current Smoker, Diabetes and Hypertension.  Sonographer:    Harvie Junior Referring Phys: [id number] ANASTASSIA  DOUTOVA  Sonographer Comments: Technically difficult study due to poor echo windows and patient is obese. Image acquisition challenging due to patient body habitus. IMPRESSIONS  1. Nondiagnostic study due to poor windows.  2. Left ventricular ejection fraction, by estimation, is 55 to 60%. The left ventricle has normal function. Left ventricular endocardial border not optimally defined to evaluate regional wall motion. There is mild concentric left ventricular hypertrophy. Left ventricular diastolic parameters were normal.  3. Right ventricular systolic function was not well visualized. The right ventricular size is not well visualized. Tricuspid regurgitation signal is inadequate for assessing PA pressure.  4. The  mitral valve is grossly normal. No evidence of mitral valve regurgitation. No evidence of mitral stenosis.  5. The aortic valve was not well visualized. Aortic valve regurgitation is not visualized.  6. The inferior vena cava is dilated in size with <50% respiratory variability, suggesting right atrial pressure of 15 mmHg. Comparison(s): No prior Echocardiogram. FINDINGS  Left Ventricle: Left ventricular ejection fraction, by estimation, is 55 to 60%. The left ventricle has normal function. Left ventricular endocardial border not optimally defined to evaluate regional wall motion. Definity contrast agent was given IV to delineate the left ventricular endocardial borders. The left ventricular internal cavity size was normal in size. There is mild concentric left ventricular hypertrophy. Left ventricular diastolic parameters were normal. Right Ventricle: The right ventricular size is not well visualized. Right vetricular wall thickness was not well visualized. Right ventricular systolic function was not well visualized. Tricuspid regurgitation signal is inadequate for assessing PA pressure. Left Atrium: Left atrial size was not well visualized. Right Atrium: Right atrial size was not well visualized.  Pericardium: There is no evidence of pericardial effusion. Mitral Valve: The mitral valve is grossly normal. No evidence of mitral valve regurgitation. No evidence of mitral valve stenosis. Tricuspid Valve: The tricuspid valve is normal in structure. Tricuspid valve regurgitation is trivial. Aortic Valve: The aortic valve was not well visualized. Aortic valve regurgitation is not visualized. Aortic valve mean gradient measures 8.0 mmHg. Aortic valve peak gradient measures 15.0 mmHg. Aortic valve area, by VTI measures 2.27 cm. Pulmonic Valve: The pulmonic valve was not well visualized. Pulmonic valve regurgitation is not visualized. Aorta: The aortic root is normal in size and structure. Venous: The inferior vena cava is dilated in size with less than 50% respiratory variability, suggesting right atrial pressure of 15 mmHg. IAS/Shunts: The atrial septum is grossly normal.  LEFT VENTRICLE PLAX 2D LVIDd:         4.10 cm   Diastology LVIDs:         3.20 cm   LV e' medial:    10.30 cm/s LV PW:         1.10 cm   LV E/e' medial:  13.2 LV IVS:        1.35 cm   LV e' lateral:   11.10 cm/s LVOT diam:     2.00 cm   LV E/e' lateral: 12.3 LV SV:         87 LV SV Index:   33 LVOT Area:     3.14 cm  RIGHT VENTRICLE RV S prime:     11.00 cm/s LEFT ATRIUM           Index LA diam:      3.05 cm 1.14 cm/m LA Vol (A4C): 64.6 ml 24.16 ml/m  AORTIC VALVE AV Area (Vmax):    2.01 cm AV Area (Vmean):   1.90 cm AV Area (VTI):     2.27 cm AV Vmax:           193.50 cm/s AV Vmean:          132.500 cm/s AV VTI:            0.384 m AV Peak Grad:      15.0 mmHg AV Mean Grad:      8.0 mmHg LVOT Vmax:         124.00 cm/s LVOT Vmean:        80.300 cm/s LVOT VTI:          0.277 m LVOT/AV VTI ratio:  0.72  AORTA Ao Root diam: 3.00 cm Ao Asc diam:  3.00 cm MITRAL VALVE MV Area (PHT): 3.27 cm     SHUNTS MV Decel Time: 232 msec     Systemic VTI:  0.28 m MV E velocity: 136.00 cm/s  Systemic Diam: 2.00 cm MV A velocity: 67.90 cm/s MV E/A ratio:  2.00  Gwyndolyn Kaufman MD Electronically signed by Gwyndolyn Kaufman MD Signature Date/Time: 06/05/2022/5:32:37 PM    Final    X-ray chest PA or AP  Result Date: 06/04/2022 CLINICAL DATA:  Hypoxia EXAM: CHEST  1 VIEW COMPARISON:  07/17/2021 FINDINGS: Single frontal view of the chest demonstrates a stable cardiac silhouette. No acute airspace disease, effusion, or pneumothorax. No acute bony abnormalities. IMPRESSION: 1. No acute intrathoracic process. Electronically Signed   By: Randa Ngo M.D.   On: 06/04/2022 17:16   MM 3D SCREEN BREAST BILATERAL  Result Date: 05/15/2022 CLINICAL DATA:  Screening. EXAM: DIGITAL SCREENING BILATERAL MAMMOGRAM WITH TOMOSYNTHESIS AND CAD TECHNIQUE: Bilateral screening digital craniocaudal and mediolateral oblique mammograms were obtained. Bilateral screening digital breast tomosynthesis was performed. The images were evaluated with computer-aided detection. COMPARISON:  Previous exam(s). ACR Breast Density Category b: There are scattered areas of fibroglandular density. FINDINGS: There are no findings suspicious for malignancy. IMPRESSION: No mammographic evidence of malignancy. A result letter of this screening mammogram will be mailed directly to the patient. RECOMMENDATION: Screening mammogram in one year. (Code:SM-B-01Y) BI-RADS CATEGORY  1: Negative. Electronically Signed   By: Ileana Roup M.D.   On: 05/15/2022 09:27    Microbiology: Recent Results (from the past 240 hour(s))  Resp panel by RT-PCR (RSV, Flu A&B, Covid) Anterior Nasal Swab     Status: None   Collection Time: 06/04/22  6:25 PM   Specimen: Anterior Nasal Swab  Result Value Ref Range Status   SARS Coronavirus 2 by RT PCR NEGATIVE NEGATIVE Final    Comment: (NOTE) SARS-CoV-2 target nucleic acids are NOT DETECTED.  The SARS-CoV-2 RNA is generally detectable in upper respiratory specimens during the acute phase of infection. The lowest concentration of SARS-CoV-2 viral copies this assay can detect  is 138 copies/mL. A negative result does not preclude SARS-Cov-2 infection and should not be used as the sole basis for treatment or other patient management decisions. A negative result may occur with  improper specimen collection/handling, submission of specimen other than nasopharyngeal swab, presence of viral mutation(s) within the areas targeted by this assay, and inadequate number of viral copies(<138 copies/mL). A negative result must be combined with clinical observations, patient history, and epidemiological information. The expected result is Negative.  Fact Sheet for Patients:  EntrepreneurPulse.com.au  Fact Sheet for Healthcare Providers:  IncredibleEmployment.be  This test is no t yet approved or cleared by the [place] FDA and  has been authorized for detection and/or diagnosis of SARS-CoV-2 by FDA under an Emergency Use Authorization (EUA). This EUA will remain  in effect (meaning this test can be used) for the duration of the COVID-19 declaration under Section 564(b)(1) of the Act, 21 U.S.C.section 360bbb-3(b)(1), unless the authorization is terminated  or revoked sooner.       Influenza A by PCR NEGATIVE NEGATIVE Final   Influenza B by PCR NEGATIVE NEGATIVE Final    Comment: (NOTE) The Xpert Xpress SARS-CoV-2/FLU/RSV plus assay is intended as an aid in the diagnosis of influenza from Nasopharyngeal swab specimens and should not be used as a sole basis for treatment. Nasal washings and aspirates are unacceptable for Xpert Xpress  SARS-CoV-2/FLU/RSV testing.  Fact Sheet for Patients: EntrepreneurPulse.com.au  Fact Sheet for Healthcare Providers: IncredibleEmployment.be  This test is not yet approved or cleared by the [place] FDA and has been authorized for detection and/or diagnosis of SARS-CoV-2 by FDA under an Emergency Use Authorization (EUA). This EUA will remain in effect  (meaning this test can be used) for the duration of the COVID-19 declaration under Section 564(b)(1) of the Act, 21 U.S.C. section 360bbb-3(b)(1), unless the authorization is terminated or revoked.     Resp Syncytial Virus by PCR NEGATIVE NEGATIVE Final    Comment: (NOTE) Fact Sheet for Patients: EntrepreneurPulse.com.au  Fact Sheet for Healthcare Providers: IncredibleEmployment.be  This test is not yet approved or cleared by the [place] FDA and has been authorized for detection and/or diagnosis of SARS-CoV-2 by FDA under an Emergency Use Authorization (EUA). This EUA will remain in effect (meaning this test can be used) for the duration of the COVID-19 declaration under Section 564(b)(1) of the Act, 21 U.S.C. section 360bbb-3(b)(1), unless the authorization is terminated or revoked.  Performed at [facility], [place] [address], [place], [place] [address]      Labs: Basic Metabolic Panel: Recent Labs  Lab 06/04/22 1854 06/05/22 0607 06/06/22 0511 06/07/22 0447 06/08/22 0535  NA 135 137 137 135 138  K 5.0 5.5* 5.7* 5.5* 4.3  CL 100 104 104 100 100  CO2 '27 29 29 31 '$ 34*  GLUCOSE 197* 230* 215* 160* 108*  BUN 8 12 24* 22* 21*  CREATININE 0.82 0.77 0.85 0.90 0.85  CALCIUM 8.3* 8.5* 8.6* 8.8* 8.7*  MG 2.3 2.7*  --   --   --   PHOS 3.6 3.3  --   --   --    Liver Function Tests: Recent Labs  Lab 06/04/22 1854 06/05/22 0607  AST 22 16  ALT 17 14  ALKPHOS 75 74  BILITOT 0.5 0.3  PROT 9.0* 8.6*  ALBUMIN 3.3* 3.2*   No results for input(s): "LIPASE", "AMYLASE" in the last 168 hours. No results for input(s): "AMMONIA" in the last 168 hours. CBC: Recent Labs  Lab 06/04/22 1854 06/05/22 0607 06/06/22 0511 06/07/22 0447 06/08/22 0535  WBC 11.4* 9.1 10.8* 12.5* 11.3*  NEUTROABS 10.7*  --   --   --   --   HGB 10.5* 9.7* 9.4* 9.4* 8.6*  HCT 36.8 34.8* 33.7* 33.9* 30.7*  MCV 82.3 83.5 83.8 83.9 82.7  PLT  267 276 308 293 259   Cardiac Enzymes: Recent Labs  Lab 06/04/22 1854  CKTOTAL 128   BNP: BNP (last 3 results) No results for input(s): "BNP" in the last 8760 hours.  ProBNP (last 3 results) No results for input(s): "PROBNP" in the last 8760 hours.  CBG: Recent Labs  Lab 06/07/22 1641 06/07/22 2030 06/08/22 0010 06/08/22 0429 06/08/22 1132  GLUCAP 215* 166* 117* 104* 138*       Signed:  Domenic Polite MD.  [facility] 06/08/2022, 2:46 PM

## 2022-06-08 NOTE — TOC CM/SW Note (Signed)
Transition of Care [facility]) Screening Note  Patient Details  Name: Deborah Benson Date of Birth: [birth year]  Transition of Care [facility]) CM/SW Contact:    Sherie Don, LCSW Phone Number: 06/08/2022, 3:28 PM  Transition of Care Department [facility]) has reviewed patient and no TOC needs have been identified at this time. We will continue to monitor patient advancement through interdisciplinary progression rounds. If new patient transition needs arise, please place a TOC consult.

## 2022-06-08 NOTE — Plan of Care (Signed)
  Problem: Education: Goal: Ability to describe self-care measures that may prevent or decrease complications (Diabetes Survival Skills Education) will improve Outcome: Adequate for Discharge Goal: Individualized Educational Video(s) Outcome: Adequate for Discharge

## 2022-06-12 ENCOUNTER — Telehealth: Payer: Self-pay | Admitting: Surgery

## 2022-06-12 NOTE — Telephone Encounter (Signed)
Reached out to patient for post-op call and LVM.

## 2022-06-12 NOTE — Telephone Encounter (Signed)
Spoke with Deborah Benson this morning. She states she is eating, drinking and urinating well. She has not had a BM yet but is passing gas. She is taking senokot as prescribed and encouraged her to drink plenty of water. She denies fever or chills. Incisions are dry and intact. She rates her pain 7/10. Her pain is controlled with Aleve.    Instructed to call office with any fever, chills, purulent drainage, uncontrolled pain or any other questions or concerns. Patient verbalizes understanding.   Pt aware of post op appointments as well as the office number [phone] and after hours number [phone] to call if she has any questions or concerns

## 2022-06-24 ENCOUNTER — Inpatient Hospital Stay: Payer: Medicaid Other | Admitting: Psychiatry

## 2022-06-24 ENCOUNTER — Ambulatory Visit: Payer: Medicaid Other

## 2022-06-24 DIAGNOSIS — N8502 Endometrial intraepithelial neoplasia [EIN]: Secondary | ICD-10-CM

## 2022-06-24 NOTE — Progress Notes (Signed)
Pt rescheduled. This encounter was created in error - please disregard.

## 2022-07-01 ENCOUNTER — Inpatient Hospital Stay: Payer: Medicaid Other | Attending: Psychiatry | Admitting: Psychiatry

## 2022-07-01 ENCOUNTER — Telehealth: Payer: Self-pay

## 2022-07-01 ENCOUNTER — Encounter: Payer: Self-pay | Admitting: Oncology

## 2022-07-01 ENCOUNTER — Other Ambulatory Visit: Payer: Self-pay

## 2022-07-01 VITALS — BP 167/71 | HR 84 | Temp 98.7°F | Resp 14 | Wt >= 6400 oz

## 2022-07-01 DIAGNOSIS — Z6841 Body Mass Index (BMI) 40.0 and over, adult: Secondary | ICD-10-CM

## 2022-07-01 DIAGNOSIS — J9601 Acute respiratory failure with hypoxia: Secondary | ICD-10-CM

## 2022-07-01 DIAGNOSIS — Z9889 Other specified postprocedural states: Secondary | ICD-10-CM | POA: Insufficient documentation

## 2022-07-01 DIAGNOSIS — J441 Chronic obstructive pulmonary disease with (acute) exacerbation: Secondary | ICD-10-CM

## 2022-07-01 DIAGNOSIS — J4541 Moderate persistent asthma with (acute) exacerbation: Secondary | ICD-10-CM

## 2022-07-01 DIAGNOSIS — Z7189 Other specified counseling: Secondary | ICD-10-CM

## 2022-07-01 DIAGNOSIS — N8502 Endometrial intraepithelial neoplasia [EIN]: Secondary | ICD-10-CM

## 2022-07-01 DIAGNOSIS — C541 Malignant neoplasm of endometrium: Secondary | ICD-10-CM

## 2022-07-01 DIAGNOSIS — Z79890 Hormone replacement therapy (postmenopausal): Secondary | ICD-10-CM | POA: Diagnosis not present

## 2022-07-01 MED ORDER — LORAZEPAM 0.5 MG PO TABS: 0.5000 mg | ORAL_TABLET | Freq: Three times a day (TID) | ORAL | 0 refills | Status: DC

## 2022-07-01 NOTE — Progress Notes (Unsigned)
Gynecologic Oncology Return Clinic Visit  Date of Service: 07/01/2022 Referring Provider: Lind Covert, [address] [place],  [place] [address]***  Assessment & Plan: Deborah Benson is a 62 y.o. woman with Stage *** who is *** weeks s/p *** on ***.  Postop: - Pt recovering well from surgery and healing appropriately postoperatively - Intraoperative findings and pathology results reviewed. - Ongoing postoperative expectations and precautions reviewed. Continue with no lifting >10lbs through 6 weeks postoperatively - Pt works ***. Okay to return to work at [employer] - Given that uterus is in situ, pt advised that she should continue with pap smear screening per routine until age 56 if she continues with negative/low grade paps.  - Reviewed that after 12 months without menstrual cycles, she should not have any spotting or bleeding.  If this were to occur, she should be evaluated for postmenopausal bleeding.  ***MRI, refer to genetics Needs MMR, p53  ***VTE Prophylaxis: - Khorana score = ***  RTC ***.  Bernadene Bell, MD Gynecologic Oncology   Medical Decision Making I personally spent  TOTAL *** minutes face-to-face and non-face-to-face in the care of this patient, which includes all pre, intra, and post visit time on the date of service.  *** minutes spent reviewing records prior to the visit *** Minutes in patient contact      *** minutes in other billable services *** minutes charting , conferring with consultants etc.   ----------------------- Reason for Visit: Postop***  Treatment History: Oncology History   No history exists.    Interval History: Pt reports that she is recovering well from surgery. She is using *** for pain. She is eating and drinking well. She is voiding without issue and having regular bowel movements.   ***no bleeding Light cramp Ibuprofen - helps some, tired  Past Medical/Surgical History: Past Medical History:  Diagnosis  Date   Arthritis    Asthma    Cancer ([place])    ovarian cancer   Depression    Diabetes mellitus without complication ([place])    oral meds only   Gout    HTN (hypertension)    "borderline" med was stopped by pt- MD aware.   Neuromuscular disorder (HCC)    neuropathy -hands/ feet   Neuropathy    Obesity    BMI >76.6   Pneumonia     Past Surgical History:  Procedure Laterality Date   FOOT SURGERY     due to needle break in foot   HYSTEROSCOPY WITH D & C N/A 06/04/2022   Procedure: DILATATION AND CURETTAGE;  Surgeon: Bernadene Bell, MD;  Location: [facility];  Service: Gynecology;  Laterality: N/A;   INTRAUTERINE DEVICE (IUD) INSERTION N/A 06/04/2022   Procedure: MIRENA INTRAUTERINE DEVICE (IUD) INSERTION;  Surgeon: Bernadene Bell, MD;  Location: [facility];  Service: Gynecology;  Laterality: N/A;   MULTIPLE TOOTH EXTRACTIONS     TONSILLECTOMY      Family History  Problem Relation Age of Onset   Heart attack Mother    Stroke Mother    Gout Mother    Hypertension Mother    Lung cancer Father    Glaucoma Father    Heart disease Sister        fluid    Other Sister        spinal disease   Diabetes Brother    Cancer - Ovarian Maternal Grandmother        declined treatment   Dementia Maternal Grandfather    Glaucoma Other  nephew   Seizures Other        niece   Colon cancer Neg Hx    Breast cancer Neg Hx    Endometrial cancer Neg Hx    Prostate cancer Neg Hx    Pancreatic cancer Neg Hx     Social History   Socioeconomic History   Marital status: Single    Spouse name: Not on file   Number of children: 0   Years of education: Not on file   Highest education level: Not on file  Occupational History   Occupation: disabled  Tobacco Use   Smoking status: Every Day    Packs/day: 0.40    Types: Cigarettes    Start date: 06/10/1978   Smokeless tobacco: Never   Tobacco comments:    down to 5-7 per day - Previous 7-10  Vaping Use   Vaping Use: Never used   Substance and Sexual Activity   Alcohol use: No    Alcohol/week: 0.0 standard drinks of alcohol   Drug use: No   Sexual activity: Not Currently  Other Topics Concern   Not on file  Social History Narrative         Past Medical History:      Irregular menstrual periods      Asthma         Social History:      Disabled?,  Weight and OA; Moved to [place] 6 yrs ago.  Single         Social Determinants of Health   Financial Resource Strain: Not on file  Food Insecurity: No Food Insecurity (06/05/2022)   Hunger Vital Sign    Worried About Running Out of Food in the Last Year: Never true    Ran Out of Food in the Last Year: Never true  Transportation Needs: No Transportation Needs (06/05/2022)   PRAPARE - Hydrologist (Medical): No    Lack of Transportation (Non-Medical): No  Physical Activity: Not on file  Stress: Not on file  Social Connections: Not on file    Current Medications:  Current Outpatient Medications:    albuterol (PROVENTIL) (2.5 MG/3ML) 0.083% nebulizer solution, Take 3 mLs (2.5 mg total) by nebulization every 6 (six) hours as needed for wheezing or shortness of breath., Disp: 150 mL, Rfl: 1   gabapentin (NEURONTIN) 100 MG capsule, Take 1 capsule (100 mg total) by mouth 3 (three) times daily. (Patient taking differently: Take 100 mg by mouth daily.), Disp: 90 capsule, Rfl: 0   medroxyPROGESTERone (PROVERA) 10 MG tablet, Take 1 tablet (10 mg total) by mouth in the morning and at bedtime., Disp: 60 tablet, Rfl: 0   metFORMIN (GLUCOPHAGE-XR) 500 MG 24 hr tablet, TAKE 1 TABLET(500 MG) BY MOUTH IN THE MORNING AND AT BEDTIME (Patient taking differently: Take 500 mg by mouth daily.), Disp: 180 tablet, Rfl: 0   nystatin (MYCOSTATIN/NYSTOP) powder, Apply 1 Application topically 3 (three) times daily as needed. (Patient taking differently: Apply 1 Application topically daily.), Disp: 60 g, Rfl: 5   PROAIR HFA 108 (90 Base) MCG/ACT inhaler, INHALE 2  PUFFS BY MOUTH EVERY 6 HOURS AS NEEDED FOR SHORTNESS OF BREATH. PLEASE MAKE AN APPOINTMENT, Disp: 17 g, Rfl: 0   TRULICITY 0.26 VZ/8.5YI SOPN, ADMINISTER 0.75 MG UNDER THE SKIN 1 TIME A WEEK, Disp: 2 mL, Rfl: 0  Review of Symptoms: Complete 10-system review is negative except ***as above in Interval History.  Physical Exam: There were no vitals taken for this  visit. General: ***Alert, oriented, no acute distress. HEENT: ***Normocephalic, atraumatic. Neck symmetric without masses. Sclera anicteric.  Chest: ***Normal work of breathing. ***Clear to auscultation bilaterally.   Cardiovascular: ***Regular rate and rhythm, no murmurs. Abdomen: ***Soft, nontender.  Normoactive bowel sounds.  No masses or hepatosplenomegaly appreciated.  ***Well-healing incision***s. Extremities: ***Grossly normal range of motion.  Warm, well perfused.  No edema bilaterally. Skin: ***No rashes or lesions noted. GU: Normal appearing external genitalia without erythema, excoriation, or lesions.  Speculum exam reveals ***.  Bimanual exam reveals ***. Exam chaperoned by ***  Laboratory & Radiologic Studies: ***

## 2022-07-01 NOTE — Patient Instructions (Addendum)
It was a pleasure to see you in clinic today. - Let us know if you think the IUD fell out or increase in heavy bleeding. - Referral placed to pulmonology. -Plan for MRI in the next couple weeks. We have sent in a one time dose of Ativan to take 30-45 minutes before your scan. Do not take and drive - Return visit planned for 3 months  Thank you very much for allowing me to provide care for you today.  I appreciate your confidence in choosing our Gynecologic Oncology team at [facility].  If you have any questions about your visit today please call our office or send [place] a MyChart message and we will get back to you as soon as possible.

## 2022-07-01 NOTE — Progress Notes (Signed)
Requested P53 and MMR testing on accession [id number] with [facility] via email.

## 2022-07-01 NOTE — Telephone Encounter (Signed)
Pt was seen today by Dr.Newton, she states she forgot to mention she needs a refill on the Provera '10mg'$ .

## 2022-07-02 ENCOUNTER — Other Ambulatory Visit: Payer: Self-pay

## 2022-07-02 NOTE — Telephone Encounter (Signed)
LVM for [name] to call office regarding medication management

## 2022-07-02 NOTE — Telephone Encounter (Signed)
Pt is aware to stop the Provera d/t having the IUD

## 2022-07-03 ENCOUNTER — Encounter: Payer: Self-pay | Admitting: Psychiatry

## 2022-07-03 ENCOUNTER — Other Ambulatory Visit: Payer: Self-pay | Admitting: Family Medicine

## 2022-07-09 ENCOUNTER — Encounter: Payer: Self-pay | Admitting: Pulmonary Disease

## 2022-07-09 ENCOUNTER — Ambulatory Visit (INDEPENDENT_AMBULATORY_CARE_PROVIDER_SITE_OTHER): Payer: Medicaid Other | Admitting: Pulmonary Disease

## 2022-07-09 VITALS — BP 142/76 | HR 71 | Ht 64.0 in | Wt >= 6400 oz

## 2022-07-09 DIAGNOSIS — G4719 Other hypersomnia: Secondary | ICD-10-CM | POA: Diagnosis not present

## 2022-07-09 MED ORDER — PREDNISONE 20 MG PO TABS: 20.0000 mg | ORAL_TABLET | Freq: Every day | ORAL | 0 refills | Status: DC

## 2022-07-09 MED ORDER — DOXYCYCLINE HYCLATE 100 MG PO TABS: 100.0000 mg | ORAL_TABLET | Freq: Two times a day (BID) | ORAL | 0 refills | Status: DC

## 2022-07-09 NOTE — Progress Notes (Addendum)
Deborah Benson    [id number]    1960/10/15  Primary Care Physician:Chambliss, Jeb Levering, MD  Referring Physician: Lind Covert, [address] [place],  [place] [address]  Chief complaint:   Patient being seen for cough and shortness of breath, congestion, history of snoring  HPI:  Had surgery in December for endometrial cancer, ended up staying in the hospital for about 4 days Required BiPAP post operatively for hypoxemic/hypercapnic respiratory failure  Prior to her procedure she had had a pneumonia a month prior  She has been coughing with sputum production General congestion  During the hospitalization she was treated with steroids, bronchodilators flutter device She did stabilize enough to be discharged home safely  She continues to cough with sputum production Does not have any fevers or chills  Usually goes to bed about 9 PM Takes about 30 to 40 minutes to fall asleep 3-4 awakenings Final wake up time about 10 AM  Admits to dryness of her mouth in the morning, no headaches She wakes up feeling like she is at a decent nights rest Has no recent driving concerns  Dad snored She is down to about 5 cigarettes a day and working on quitting  She was recently started on Trulicity which is held at present following her surgery  She does have a history of diabetes, chronic anemia  She does have some wheezing and sputum production  Outpatient Encounter Medications as of 07/09/2022  Medication Sig   albuterol (PROVENTIL) (2.5 MG/3ML) 0.083% nebulizer solution Take 3 mLs (2.5 mg total) by nebulization every 6 (six) hours as needed for wheezing or shortness of breath.   gabapentin (NEURONTIN) 100 MG capsule Take 1 capsule (100 mg total) by mouth 3 (three) times daily. (Patient taking differently: Take 100 mg by mouth daily.)   LORazepam (ATIVAN) 0.5 MG tablet Take 1 tablet (0.5 mg total) by mouth every 8 (eight) hours.   medroxyPROGESTERone  (PROVERA) 10 MG tablet Take 1 tablet (10 mg total) by mouth in the morning and at bedtime.   metFORMIN (GLUCOPHAGE-XR) 500 MG 24 hr tablet TAKE 1 TABLET(500 MG) BY MOUTH IN THE MORNING AND AT BEDTIME (Patient taking differently: Take 500 mg by mouth daily.)   nystatin (MYCOSTATIN/NYSTOP) powder Apply 1 Application topically 3 (three) times daily as needed. (Patient taking differently: Apply 1 Application topically daily.)   PROAIR HFA 108 (90 Base) MCG/ACT inhaler INHALE 2 PUFFS BY MOUTH EVERY 6 HOURS AS NEEDED FOR SHORTNESS OF BREATH. PLEASE MAKE AN APPOINTMENT   TRULICITY 0.24 OX/7.3ZH SOPN ADMINISTER 0.75 MG UNDER THE SKIN 1 TIME A WEEK   No facility-administered encounter medications on file as of 07/09/2022.    Allergies as of 07/09/2022 - Review Complete 07/09/2022  Allergen Reaction Noted   Penicillins Hives 10/01/2010    Past Medical History:  Diagnosis Date   Arthritis    Asthma    Cancer ([place])    ovarian cancer   Depression    Diabetes mellitus without complication ([place])    oral meds only   Gout    HTN (hypertension)    "borderline" med was stopped by pt- MD aware.   Neuromuscular disorder (HCC)    neuropathy -hands/ feet   Neuropathy    Obesity    BMI >76.6   Pneumonia     Past Surgical History:  Procedure Laterality Date   FOOT SURGERY     due to needle break in foot   HYSTEROSCOPY  WITH D & C N/A 06/04/2022   Procedure: DILATATION AND CURETTAGE;  Surgeon: Bernadene Bell, MD;  Location: [facility];  Service: Gynecology;  Laterality: N/A;   INTRAUTERINE DEVICE (IUD) INSERTION N/A 06/04/2022   Procedure: MIRENA INTRAUTERINE DEVICE (IUD) INSERTION;  Surgeon: Bernadene Bell, MD;  Location: [facility];  Service: Gynecology;  Laterality: N/A;   MULTIPLE TOOTH EXTRACTIONS     TONSILLECTOMY      Family History  Problem Relation Age of Onset   Heart attack Mother    Stroke Mother    Gout Mother    Hypertension Mother    Lung cancer Father    Glaucoma Father    Heart  disease Sister        fluid    Other Sister        spinal disease   Diabetes Brother    Cancer - Ovarian Maternal Grandmother        declined treatment   Dementia Maternal Grandfather    Glaucoma Other        nephew   Seizures Other        niece   Colon cancer Neg Hx    Breast cancer Neg Hx    Endometrial cancer Neg Hx    Prostate cancer Neg Hx    Pancreatic cancer Neg Hx     Social History   Socioeconomic History   Marital status: Single    Spouse name: Not on file   Number of children: 0   Years of education: Not on file   Highest education level: Not on file  Occupational History   Occupation: disabled  Tobacco Use   Smoking status: Every Day    Packs/day: 0.40    Types: Cigarettes    Start date: 06/10/1978   Smokeless tobacco: Never   Tobacco comments:    down to 5 per day - Previous 7-10  Vaping Use   Vaping Use: Never used  Substance and Sexual Activity   Alcohol use: No    Alcohol/week: 0.0 standard drinks of alcohol   Drug use: No   Sexual activity: Not Currently  Other Topics Concern   Not on file  Social History Narrative         Past Medical History:      Irregular menstrual periods      Asthma         Social History:      Disabled?,  Weight and OA; Moved to [place] 6 yrs ago.  Single         Social Determinants of Health   Financial Resource Strain: Not on file  Food Insecurity: No Food Insecurity (06/05/2022)   Hunger Vital Sign    Worried About Running Out of Food in the Last Year: Never true    Ran Out of Food in the Last Year: Never true  Transportation Needs: No Transportation Needs (06/05/2022)   PRAPARE - Hydrologist (Medical): No    Lack of Transportation (Non-Medical): No  Physical Activity: Not on file  Stress: Not on file  Social Connections: Not on file  Intimate Partner Violence: Not At Risk (06/05/2022)   Humiliation, Afraid, Rape, and Kick questionnaire    Fear of Current or Ex-Partner: No     Emotionally Abused: No    Physically Abused: No    Sexually Abused: No    Review of Systems  Respiratory:  Positive for cough, shortness of breath and wheezing.   Psychiatric/Behavioral:  Positive for  sleep disturbance.     Vitals:   07/09/22 1512  BP: (!) 142/76  Pulse: 71  SpO2: 96%     Physical Exam Constitutional:      Appearance: She is obese.  HENT:     Head: Normocephalic.     Nose: Nose normal.     Mouth/Throat:     Mouth: Mucous membranes are moist.     Comments: Mallampati 3, crowded oropharynx, macroglossia Eyes:     General: No scleral icterus.    Pupils: Pupils are equal, round, and reactive to light.  Cardiovascular:     Rate and Rhythm: Normal rate and regular rhythm.     Heart sounds: No murmur heard.    No friction rub.  Pulmonary:     Effort: No respiratory distress.     Breath sounds: No stridor. No wheezing or rhonchi.  Musculoskeletal:     Cervical back: No rigidity or tenderness.  Neurological:     Mental Status: She is alert.  Psychiatric:        Mood and Affect: Mood normal.       07/09/2022    3:00 PM  Results of the Epworth flowsheet  Sitting and reading 2  Watching TV 2  Sitting, inactive in a public place (e.g. a theatre or a meeting) 0  As a passenger in a car for an hour without a break 1  Lying down to rest in the afternoon when circumstances permit 3  Sitting and talking to someone 0  Sitting quietly after a lunch without alcohol 1  In a car, while stopped for a few minutes in traffic 0  Total score 9    Data Reviewed: Hospital records reviewed  Assessment:  Recent hypercapnic respiratory failure  Excessive daytime sleepiness  Possible underlying obstructive lung disease with exacerbation Underlying history of asthma  Obesity hypoventilation syndrome  Class III obesity  Active smoker  Bronchitis  Plan/Recommendations: Smoking cessation counseling  Schedule for in lab polysomnogram  Encouraged to  continue working on weight loss efforts  Importance of quitting smoking discussed with the patient  I will see you back in 3 months     Sherrilyn Rist MD [facility] 07/09/2022, 3:24 PM  CC: Lind Covert, *

## 2022-07-09 NOTE — Patient Instructions (Signed)
Schedule for in lab split-night study  Will send in a prescription for antibiotics and steroids to help with the cough and congestion  Continue weight loss efforts  Continue efforts to quit smoking  Call us with significant concerns  Whenever you are more stable, we will get a breathing study to assess lung functions  Living With Sleep Apnea Sleep apnea is a condition in which breathing pauses or becomes shallow during sleep. Sleep apnea is most commonly caused by a collapsed or blocked airway. People with sleep apnea usually snore loudly. They may have times when they gasp and stop breathing for 10 seconds or more during sleep. This may happen many times during the night. The breaks in breathing also interrupt the deep sleep that you need to feel rested. Even if you do not completely wake up from the gaps in breathing, your sleep may not be restful and you feel tired during the day. You may also have a headache in the morning and low energy during the day, and you may feel anxious or depressed. How can sleep apnea affect me? Sleep apnea increases your chances of extreme tiredness during the day (daytime fatigue). It can also increase your risk for health conditions, such as: Heart attack. Stroke. Obesity. Type 2 diabetes. Heart failure. Irregular heartbeat. High blood pressure. If you have daytime fatigue as a result of sleep apnea, you may be more likely to: Perform poorly at school or work. Fall asleep while driving. Have difficulty with attention. Develop depression or anxiety. Have sexual dysfunction. What actions can I take to manage sleep apnea? Sleep apnea treatment  If you were given a device to open your airway while you sleep, use it only as told by your health care provider. You may be given: An oral appliance. This is a custom-made mouthpiece that shifts your lower jaw forward. A continuous positive airway pressure (CPAP) device. This device blows air through a mask  when you breathe out (exhale). A nasal expiratory positive airway pressure (EPAP) device. This device has valves that you put into each nostril. A bi-level positive airway pressure (BIPAP) device. This device blows air through a mask when you breathe in (inhale) and breathe out (exhale). You may need surgery if other treatments do not work for you. Sleep habits Go to sleep and wake up at the same time every day. This helps set your internal clock (circadian rhythm) for sleeping. If you stay up later than usual, such as on weekends, try to get up in the morning within 2 hours of your normal wake time. Try to get at least 7-9 hours of sleep each night. Stop using a computer, tablet, and mobile phone a few hours before bedtime. Do not take long naps during the day. If you nap, limit it to 30 minutes. Have a relaxing bedtime routine. Reading or listening to music may relax you and help you sleep. Use your bedroom only for sleep. Keep your television and computer out of your bedroom. Keep your bedroom cool, dark, and quiet. Use a supportive mattress and pillows. Follow your health care provider's instructions for other changes to sleep habits. Nutrition Do not eat heavy meals in the evening. Do not have caffeine in the later part of the day. The effects of caffeine can last for more than 5 hours. Follow your health care provider's or dietitian's instructions for any diet changes. Lifestyle     Do not drink alcohol before bedtime. Alcohol can cause you to fall asleep at  first, but then it can cause you to wake up in the middle of the night and have trouble getting back to sleep. Do not use any products that contain nicotine or tobacco. These products include cigarettes, chewing tobacco, and vaping devices, such as e-cigarettes. If you need help quitting, ask your health care provider. Medicines Take over-the-counter and prescription medicines only as told by your health care provider. Do not  use over-the-counter sleep medicine. You can become dependent on this medicine, and it can make sleep apnea worse. Do not use medicines, such as sedatives and narcotics, unless told by your health care provider. Activity Exercise on most days, but avoid exercising in the evening. Exercising near bedtime can interfere with sleeping. If possible, spend time outside every day. Natural light helps regulate your circadian rhythm. General information Lose weight if you need to, and maintain a healthy weight. Keep all follow-up visits. This is important. If you are having surgery, make sure to tell your health care provider that you have sleep apnea. You may need to bring your device with you. Where to find more information Learn more about sleep apnea and daytime fatigue from: American Sleep Association: sleepassociation.[place]: sleepfoundation.org National Heart, Lung, and Blood Institute: https://www.hartman-hill.biz/ Summary Sleep apnea is a condition in which breathing pauses or becomes shallow during sleep. Sleep apnea can cause daytime fatigue and other serious health conditions. You may need to wear a device while sleeping to help keep your airway open. If you are having surgery, make sure to tell your health care provider that you have sleep apnea. You may need to bring your device with you. Making changes to sleep habits, diet, lifestyle, and activity can help you manage sleep apnea. This information is not intended to replace advice given to you by your health care provider. Make sure you discuss any questions you have with your health care provider. Document Revised: 01/03/2021 Document Reviewed: 05/05/2020 Elsevier Patient Education  [place].

## 2022-07-17 ENCOUNTER — Ambulatory Visit
Admission: RE | Admit: 2022-07-17 | Discharge: 2022-07-17 | Disposition: A | Discharge status: Home / Self Care | Payer: Medicaid Other | Source: Ambulatory Visit | Attending: Psychiatry | Admitting: Psychiatry

## 2022-07-17 DIAGNOSIS — C541 Malignant neoplasm of endometrium: Secondary | ICD-10-CM

## 2022-07-17 MED ORDER — GADOPICLENOL 0.5 MMOL/ML IV SOLN
10.0000 mL | Freq: Once | INTRAVENOUS | Status: AC | PRN
  Administered 2022-07-17: 10 mL via INTRAVENOUS

## 2022-07-22 ENCOUNTER — Telehealth: Payer: Self-pay | Admitting: Oncology

## 2022-07-22 ENCOUNTER — Other Ambulatory Visit: Payer: Self-pay | Admitting: Hematology and Oncology

## 2022-07-22 ENCOUNTER — Telehealth: Payer: Self-pay | Admitting: Psychiatry

## 2022-07-22 ENCOUNTER — Other Ambulatory Visit: Payer: Self-pay | Admitting: Oncology

## 2022-07-22 DIAGNOSIS — C55 Malignant neoplasm of uterus, part unspecified: Secondary | ICD-10-CM | POA: Insufficient documentation

## 2022-07-22 DIAGNOSIS — C549 Malignant neoplasm of corpus uteri, unspecified: Secondary | ICD-10-CM

## 2022-07-22 NOTE — Telephone Encounter (Signed)
Called pt regarding her MRI results. Concern for 76m right pelvic lymph node, suspicious for cancer spread. Discussed staging scans with CT c/a/p and referral to Dr. GAlvy Bimlerfor systemic treatment. Discussed that radiation would be an alternative treatment but would require laying lfat and a procedure under anesthesia. Pt reports SOB and cough are improving with course of prednisone and doxycycline but still using albuterol daily. She is encouraged to keep her pulm follow-up and continue with follow-up with her PCP. She is reporting light vaginal bleeding but not requiring changing of pad throughout day. Bleeding precautions reviewed.   Pt reports that she has a funeral 2/19 and cannot make a CT scan on that day but will make another day work. Will let KElmo Puttknow to help with coordination.

## 2022-07-22 NOTE — Progress Notes (Addendum)
Gynecologic Oncology Multi-Disciplinary Disposition Conference Note  Date of the Conference: 07/22/2022  Patient Name: Deborah Benson  Referring Provider: Dr. Sabra Heck Primary GYN Oncologist: Dr. Ernestina Patches   Stage/Disposition: At least clinical stage 1, grade 1 endometrioid endometrial cancer. Concern for stage IIIC1 based on MRI imaging of right pelvic lymph nodes. Disposition is for a CT of the chest, abdomen and pelvis followed by systemic chemotherapy followed by consideration for external beam radiation..   This Multidisciplinary conference took place involving physicians from [place], Medical Oncology, Radiation Oncology, Pathology, Radiology along with the Gynecologic Oncology Nurse Practitioner and Gynecologic Oncology Nurse Navigator.  Comprehensive assessment of the patient's malignancy, staging, need for surgery, chemotherapy, radiation therapy, and need for further testing were reviewed. Supportive measures, both inpatient and following discharge were also discussed. The recommended plan of care is documented. Greater than 35 minutes were spent correlating and coordinating this patient's care.

## 2022-07-22 NOTE — Telephone Encounter (Signed)
Called Deborah Benson and advised her of CT scan appointment at [facility] on 08/02/22.  Advised her to arrive at 2:15 and gave instructions for NPO 4 hours prior to 4:30 pm scan.  Also discussed that she will need to drink contrast for the scan and that will be provided by radiology.  Also scheduled new patient appointment with Dr. Alvy Bimler on 08/06/22 at 1:00 with 12:30 arrival at the [facility].  She verbalized understanding and agreement of all appointments and instructions.

## 2022-07-29 ENCOUNTER — Ambulatory Visit (HOSPITAL_COMMUNITY): Payer: Medicaid Other

## 2022-08-02 ENCOUNTER — Ambulatory Visit (HOSPITAL_COMMUNITY): Admission: RE | Admit: 2022-08-02 | Payer: Medicaid Other | Source: Ambulatory Visit

## 2022-08-05 ENCOUNTER — Telehealth: Payer: Self-pay | Admitting: *Deleted

## 2022-08-05 NOTE — Telephone Encounter (Signed)
Per Dr Alvy Bimler moved the patient's appt from 2/27 to 3/14. Patient aware of new date and time; also to arrive 30 minutes early

## 2022-08-06 ENCOUNTER — Inpatient Hospital Stay: Payer: Medicaid Other | Admitting: Hematology and Oncology

## 2022-08-06 ENCOUNTER — Telehealth: Payer: Self-pay | Admitting: Hematology and Oncology

## 2022-08-06 NOTE — Telephone Encounter (Signed)
Per 2/27 IB reached out to answer questions about appointments, left voicemail.

## 2022-08-07 ENCOUNTER — Telehealth: Payer: Self-pay | Admitting: Hematology and Oncology

## 2022-08-07 NOTE — Telephone Encounter (Signed)
Patient called to go over appointments coming up, mailed paperwork.

## 2022-08-08 ENCOUNTER — Other Ambulatory Visit: Payer: Self-pay | Admitting: Family Medicine

## 2022-08-08 DIAGNOSIS — E119 Type 2 diabetes mellitus without complications: Secondary | ICD-10-CM

## 2022-08-14 NOTE — Progress Notes (Unsigned)
REFERRING PROVIDER: Heath Lark, MD  PRIMARY PROVIDER:  Lind Covert, MD  PRIMARY REASON FOR VISIT:  Encounter Diagnoses  Name Primary?   Malignant neoplasm of body of uterus, unspecified site ([place]) Yes   Family history of ovarian cancer     HISTORY OF PRESENT ILLNESS:   Deborah Benson, a 62 y.o. female, was seen for a [facility] cancer genetics consultation at the request of Dr. Alvy Bimler due to a personal and family history of cancer. Deborah Benson presents to clinic today to discuss the possibility of a hereditary predisposition to cancer, to discuss genetic testing, and to further clarify her future cancer risks, as well as potential cancer risks for family members.   Deborah Benson was diagnosed with endometrial cancer at age 72. The pathology showed MSI stable and normal MMR.    CANCER HISTORY:  Oncology History Overview Note  MSI stable   Uterine cancer ([place])  04/17/2022 Imaging   US pelvis 1. Prominent abnormal endometrial thickening (41 mm). In the setting of post-menopausal bleeding, endometrial sampling is indicated to exclude carcinoma. If results are benign, sonohysterogram should be considered for focal lesion work-up.  2. Nonvisualization of the ovaries. No adnexal masses.   06/04/2022 Pathology Results   A. 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  See comment.    07/17/2022 Imaging   MRI pelvis 1. Maximal thickness of the endometrial canal approaches 2.5 cm, compared to gross thickening up to 4 cm on the previous exam. Presence is suspicious for residual tumor in the endometrium but shows improvement since previous imaging. Exam quality is compromised by patient's body habitus. Depth of myometrial invasion is difficult to assess on the current study but there is certainly no gross invasion beyond 50%. If additional MRI  follow-up is desired direct involvement from a radiologist for protocol prescription may be helpful to mitigate artifacts and maximize image quality. 2. IUD in place 3. 11 mm RIGHT pelvic sidewall lymph node other smaller lymph nodes in the pelvis. This finding is nonspecific but suspicious in light of gynecologic neoplasm. PET imaging could be considered for further evaluation.   07/22/2022 Initial Diagnosis   Uterine cancer (HCC)      RISK FACTORS:  Menarche was at age 60.  First live birth at age 41.  OCP use for approximately 1-2 years.  Ovaries intact: yes.  Uterus intact: yes.  HRT use: 0 years. Colonoscopy: no Mammogram within the last year: yes. Number of breast biopsies: 0. Any excessive radiation exposure in the past: no  Past Medical History:  Diagnosis Date   Arthritis    Asthma    Cancer ([place])    ovarian cancer   Depression    Diabetes mellitus without complication ([place])    oral meds only   Gout    HTN (hypertension)    "borderline" med was stopped by pt- MD aware.   Neuromuscular disorder (HCC)    neuropathy -hands/ feet   Neuropathy    Obesity    BMI >76.6   Pneumonia     Past Surgical History:  Procedure Laterality Date   FOOT SURGERY     due to needle break in foot   HYSTEROSCOPY WITH D & C N/A 06/04/2022   Procedure: DILATATION AND CURETTAGE;  Surgeon: Bernadene Bell, MD;  Location: [facility];  Service: Gynecology;  Laterality: N/A;   INTRAUTERINE DEVICE (IUD) INSERTION N/A 06/04/2022   Procedure: MIRENA INTRAUTERINE DEVICE (  IUD) INSERTION;  Surgeon: Bernadene Bell, MD;  Location: [facility];  Service: Gynecology;  Laterality: N/A;   MULTIPLE TOOTH EXTRACTIONS     TONSILLECTOMY      Social History   Socioeconomic History   Marital status: Single    Spouse name: Not on file   Number of children: 0   Years of education: Not on file   Highest education level: Not on file  Occupational History   Occupation: disabled  Tobacco Use   Smoking  status: Every Day    Packs/day: 0.40    Types: Cigarettes    Start date: 06/10/1978   Smokeless tobacco: Never   Tobacco comments:    down to 5 per day - Previous 7-10  Vaping Use   Vaping Use: Never used  Substance and Sexual Activity   Alcohol use: No    Alcohol/week: 0.0 standard drinks of alcohol   Drug use: No   Sexual activity: Not Currently  Other Topics Concern   Not on file  Social History Narrative         Past Medical History:      Irregular menstrual periods      Asthma         Social History:      Disabled?,  Weight and OA; Moved to [place] 6 yrs ago.  Single         Social Determinants of Health   Financial Resource Strain: Not on file  Food Insecurity: No Food Insecurity (06/05/2022)   Hunger Vital Sign    Worried About Running Out of Food in the Last Year: Never true    Ran Out of Food in the Last Year: Never true  Transportation Needs: No Transportation Needs (06/05/2022)   PRAPARE - Hydrologist (Medical): No    Lack of Transportation (Non-Medical): No  Physical Activity: Not on file  Stress: Not on file  Social Connections: Not on file     FAMILY HISTORY:  We obtained a detailed, 4-generation family history.  Significant diagnoses are listed below: Family History  Problem Relation Age of Onset   Heart attack Mother    Stroke Mother    Gout Mother    Hypertension Mother    Lung cancer Father    Glaucoma Father    Heart disease Sister        fluid    Other Sister        spinal disease   Diabetes Brother    Cancer - Ovarian Maternal Grandmother        declined treatment   Dementia Maternal Grandfather    Glaucoma Other        nephew   Seizures Other        niece   Colon cancer Neg Hx    Breast cancer Neg Hx    Endometrial cancer Neg Hx    Prostate cancer Neg Hx    Pancreatic cancer Neg Hx      Deborah Benson's maternal half-sister was diagnosed with throat cancer, she smoked and died at age 56. Her maternal  uncle was diagnosed with prostate cancer. Her maternal grandmother was diagnosed with ovarian cancer in her late 89s, she died shortly after her diagnosis. Deborah Benson's father was diagnosed with lung cancer in his 41s, he was a heavy smoker and died in his 41s. Deborah Benson is unaware of previous family history of genetic testing for hereditary cancer risks. There is no reported Ashkenazi Jewish ancestry.  GENETIC COUNSELING ASSESSMENT: Ms. Dudeck is a 62 y.o. female with a personal and family history of cancer which is somewhat suggestive of a hereditary predisposition to cancer. We, therefore, discussed and recommended the following at today's visit.   DISCUSSION: We discussed that 5 - 10% of cancer is hereditary, with most cases of uterine cancer associated with Lynch Syndrome.  There are other genes that can be associated with hereditary uterine cancer syndromes.  We discussed that testing is beneficial for several reasons including knowing how to follow individuals after completing their treatment, identifying whether potential treatment options would be beneficial, and understanding if other family members could be at risk for cancer and allowing them to undergo genetic testing.   We reviewed the characteristics, features and inheritance patterns of hereditary cancer syndromes. We also discussed genetic testing, including the appropriate family members to test, the process of testing, insurance coverage and turn-around-time for results. We discussed the implications of a negative, positive, carrier and/or variant of uncertain significant result. We recommended Ms. Dansby pursue genetic testing for a panel that includes genes associated with uterine, ovarian, and prostate cancer.   Ms. Chynoweth  was offered a common hereditary cancer panel (34 genes) and an expanded pan-cancer panel (71 genes). Ms. Brinley was informed of the benefits and limitations of each panel, including that expanded  pan-cancer panels contain genes that do not have clear management guidelines at this point in time.  We also discussed that as the number of genes included on a panel increases, the chances of variants of uncertain significance increases. After considering the benefits and limitations of each gene panel, Ms. Harrigan elected to have [facility] CancerNext-Expanded Panel.  The CancerNext-Expanded gene panel offered by [facility] and includes sequencing, rearrangement, and RNA analysis for the following 71 genes: AIP, ALK, APC, ATM, AXIN2, BAP1, BARD1, BMPR1A, BRCA1, BRCA2, BRIP1, CDC73, CDH1, CDK4, CDKN1B, CDKN2A, CHEK2, CTNNA1, DICER1, FH, FLCN, KIF1B, LZTR1, MAX, MEN1, MET, MLH1, MSH2, MSH3, MSH6, MUTYH, NF1, NF2, NTHL1, PALB2, PHOX2B, PMS2, POT1, PRKAR1A, PTCH1, PTEN, RAD51C, RAD51D, RB1, RET, SDHA, SDHAF2, SDHB, SDHC, SDHD, SMAD4, SMARCA4, SMARCB1, SMARCE1, STK11, SUFU, TMEM127, TP53, TSC1, TSC2, and VHL (sequencing and deletion/duplication); EGFR, EGLN1, HOXB13, KIT, MITF, PDGFRA, POLD1, and POLE (sequencing only); EPCAM and GREM1 (deletion/duplication only).   Based on Ms. Wayne's personal and family history of cancer, she meets medical criteria for genetic testing. Despite that she meets criteria, she may still have an out of pocket cost. We discussed that if her out of pocket cost for testing is over $100, the laboratory will call and confirm whether she wants to proceed with testing.  If the out of pocket cost of testing is less than $100 she will be billed by the genetic testing laboratory.   PLAN: After considering the risks, benefits, and limitations, Ms. Ley provided informed consent to pursue genetic testing and the blood sample was sent to [facility] for analysis of the CancerNext-Expanded Panel. Results should be available within approximately 2-3 weeks' time, at which point they will be disclosed by telephone to Ms. Vertiz, as will any additional recommendations warranted by  these results. Ms. Lapointe will receive a summary of her genetic counseling visit and a copy of her results once available. This information will also be available in Epic.   Ms. Zebrowski questions were answered to her satisfaction today. Our contact information was provided should additional questions or concerns arise. Thank you for the referral and allowing [place] to share in the care of your  patient.   Lucille Passy, MS, [facility] Genetic Counselor [email or web address] (P) [phone]  The patient was seen for a total of 35 minutes in face-to-face genetic counseling. The patient was seen alone.  Drs. Lindi Adie and/or Burr Medico were available to discuss this case as needed.   _______________________________________________________________________ For Office Staff:  Number of people involved in session: 1 Was an Intern/ student involved with case: no

## 2022-08-15 ENCOUNTER — Inpatient Hospital Stay: Payer: Medicaid Other

## 2022-08-15 ENCOUNTER — Other Ambulatory Visit: Payer: Self-pay | Admitting: Genetic Counselor

## 2022-08-15 ENCOUNTER — Encounter: Payer: Self-pay | Admitting: Genetic Counselor

## 2022-08-15 ENCOUNTER — Inpatient Hospital Stay: Payer: Medicaid Other | Attending: Psychiatry | Admitting: Genetic Counselor

## 2022-08-15 ENCOUNTER — Other Ambulatory Visit: Payer: Self-pay

## 2022-08-15 DIAGNOSIS — Z8041 Family history of malignant neoplasm of ovary: Secondary | ICD-10-CM | POA: Diagnosis not present

## 2022-08-15 DIAGNOSIS — C539 Malignant neoplasm of cervix uteri, unspecified: Secondary | ICD-10-CM | POA: Insufficient documentation

## 2022-08-15 DIAGNOSIS — C549 Malignant neoplasm of corpus uteri, unspecified: Secondary | ICD-10-CM

## 2022-08-15 DIAGNOSIS — Z1379 Encounter for other screening for genetic and chromosomal anomalies: Secondary | ICD-10-CM

## 2022-08-15 DIAGNOSIS — E041 Nontoxic single thyroid nodule: Secondary | ICD-10-CM | POA: Insufficient documentation

## 2022-08-15 DIAGNOSIS — F17210 Nicotine dependence, cigarettes, uncomplicated: Secondary | ICD-10-CM | POA: Insufficient documentation

## 2022-08-15 DIAGNOSIS — Z801 Family history of malignant neoplasm of trachea, bronchus and lung: Secondary | ICD-10-CM | POA: Insufficient documentation

## 2022-08-15 DIAGNOSIS — Z6841 Body Mass Index (BMI) 40.0 and over, adult: Secondary | ICD-10-CM | POA: Insufficient documentation

## 2022-08-15 LAB — GENETIC SCREENING ORDER

## 2022-08-19 ENCOUNTER — Other Ambulatory Visit: Payer: Self-pay | Admitting: Hematology and Oncology

## 2022-08-19 ENCOUNTER — Ambulatory Visit (HOSPITAL_COMMUNITY)
Admission: RE | Admit: 2022-08-19 | Discharge: 2022-08-19 | Disposition: A | Discharge status: Home / Self Care | Payer: Medicaid Other | Source: Ambulatory Visit | Attending: Hematology and Oncology | Admitting: Hematology and Oncology

## 2022-08-19 DIAGNOSIS — C549 Malignant neoplasm of corpus uteri, unspecified: Secondary | ICD-10-CM

## 2022-08-19 MED ORDER — IOHEXOL 9 MG/ML PO SOLN
500.0000 mL | ORAL | Status: AC
  Administered 2022-08-19 (×2): 500 mL via ORAL

## 2022-08-19 MED ORDER — IOHEXOL 9 MG/ML PO SOLN
ORAL | Status: AC
  Filled 2022-08-19: qty 1000

## 2022-08-19 MED ORDER — IOHEXOL 300 MG/ML  SOLN: 100.0000 mL | Freq: Once | INTRAMUSCULAR | Status: DC | PRN

## 2022-08-22 ENCOUNTER — Telehealth: Payer: Self-pay | Admitting: Oncology

## 2022-08-22 ENCOUNTER — Inpatient Hospital Stay (HOSPITAL_BASED_OUTPATIENT_CLINIC_OR_DEPARTMENT_OTHER): Payer: Medicaid Other | Admitting: Hematology and Oncology

## 2022-08-22 ENCOUNTER — Encounter: Payer: Self-pay | Admitting: Oncology

## 2022-08-22 ENCOUNTER — Encounter: Payer: Self-pay | Admitting: Hematology and Oncology

## 2022-08-22 ENCOUNTER — Other Ambulatory Visit: Payer: Self-pay

## 2022-08-22 VITALS — BP 154/75 | HR 77 | Temp 98.5°F | Resp 20 | Ht 64.0 in | Wt >= 6400 oz

## 2022-08-22 DIAGNOSIS — Z801 Family history of malignant neoplasm of trachea, bronchus and lung: Secondary | ICD-10-CM | POA: Diagnosis not present

## 2022-08-22 DIAGNOSIS — E041 Nontoxic single thyroid nodule: Secondary | ICD-10-CM

## 2022-08-22 DIAGNOSIS — F17210 Nicotine dependence, cigarettes, uncomplicated: Secondary | ICD-10-CM

## 2022-08-22 DIAGNOSIS — C549 Malignant neoplasm of corpus uteri, unspecified: Secondary | ICD-10-CM

## 2022-08-22 DIAGNOSIS — Z8041 Family history of malignant neoplasm of ovary: Secondary | ICD-10-CM | POA: Diagnosis not present

## 2022-08-22 DIAGNOSIS — Z6841 Body Mass Index (BMI) 40.0 and over, adult: Secondary | ICD-10-CM | POA: Diagnosis not present

## 2022-08-22 DIAGNOSIS — C539 Malignant neoplasm of cervix uteri, unspecified: Secondary | ICD-10-CM

## 2022-08-22 MED ORDER — LETROZOLE 2.5 MG PO TABS: 2.5000 mg | ORAL_TABLET | Freq: Every day | ORAL | 1 refills | Status: DC

## 2022-08-22 NOTE — Progress Notes (Signed)
[place] CONSULT NOTE  Patient Care Team: Lind Covert, MD as PCP - General  ASSESSMENT & PLAN:  Uterine cancer [facility]) I have reviewed CT imaging with the patient Due to her significant health issues, I expressed major concern regarding the role of chemotherapy The patient understood why she is not a candidate for surgery or radiation treatment We discussed treatment options She has no recent vaginal bleeding since insertion of IUD I recommend antiestrogen therapy with letrozole We discussed the risk, benefits, side effects of therapy including hot flashes and weight gain We will get pathologist to add an additional testing to her tissue sample to quantify receptor positivity for estrogen and progesterone I recommend calcium and vitamin D to prevent risk of osteoporosis I plan to see her again in a month for further follow-up Recommend repeat CT imaging after 3 months of treatment  BMI 70 and over, adult [facility]) We discussed risk of weight gain with antiestrogen therapy The patient is going to discuss weight management with her primary care doctor  Left thyroid nodule This is an incidental finding from recent CT imaging I recommend thyroid ultrasound plus minus biopsy Her TSH from December was normal  Orders Placed This Encounter  Procedures   US THYROID    Standing Status:   Future    Standing Expiration Date:   08/22/2023    Order Specific Question:   Reason for Exam (SYMPTOM  OR DIAGNOSIS REQUIRED)    Answer:   abnormality of thyroid glad seen on CT    Order Specific Question:   Preferred imaging location?    Answer:   [facility]   CBC with Differential (Cancer Center Only)    Standing Status:   Future    Standing Expiration Date:   08/22/2023   Iron and Iron Binding Capacity ([facility] only)    Standing Status:   Future    Standing Expiration Date:   08/22/2023   Ferritin    Standing Status:   Future    Standing Expiration Date:    08/22/2023   Vitamin B12    Standing Status:   Future    Standing Expiration Date:   08/22/2023   CMP ([place] only)    Standing Status:   Future    Standing Expiration Date:   08/22/2023   CA 125    Standing Status:   Standing    Number of Occurrences:   11    Standing Expiration Date:   08/22/2023    The total time spent in the appointment was 80 minutes encounter with patients including review of chart and various tests results, discussions about plan of care and coordination of care plan   All questions were answered. The patient knows to call the clinic with any problems, questions or concerns. No barriers to learning was detected.  Heath Lark, MD 3/14/20243:49 PM  CHIEF COMPLAINTS/PURPOSE OF CONSULTATION:  Uterine cancer, for further management  HISTORY OF PRESENTING ILLNESS:  Deborah Benson 61 y.o. female is here because of unresectable uterine cancer Her case was discussed at the tumor board several weeks ago It took [place] a while to get CT imaging scheduled; initially, she wanted to delay CT imaging due to a funeral.  Subsequently, she have to reschedule CT imaging once due to difficulties with transportation and the need to take oral contrast.  Ultimately, CT imaging was performed without IV contrast due to difficulties with IV access She was discovered to have uterine cancer after abnormal  postmenopausal bleeding Since IUD placement, she denies further vaginal bleeding. She denies pelvic pain or discharge  I have reviewed her chart and materials related to her cancer extensively and collaborated history with the patient. Summary of oncologic history is as follows: Oncology History Overview Note  MSI stable   Uterine cancer ([place])  04/17/2022 Imaging   US pelvis 1. Prominent abnormal endometrial thickening (41 mm). In the setting of post-menopausal bleeding, endometrial sampling is indicated to exclude carcinoma. If results are benign, sonohysterogram should be considered  for focal lesion work-up.  2. Nonvisualization of the ovaries. No adnexal masses.   06/04/2022 Pathology Results   A. 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  See comment.    07/17/2022 Imaging   MRI pelvis 1. Maximal thickness of the endometrial canal approaches 2.5 cm, compared to gross thickening up to 4 cm on the previous exam. Presence is suspicious for residual tumor in the endometrium but shows improvement since previous imaging. Exam quality is compromised by patient's body habitus. Depth of myometrial invasion is difficult to assess on the current study but there is certainly no gross invasion beyond 50%. If additional MRI follow-up is desired direct involvement from a radiologist for protocol prescription may be helpful to mitigate artifacts and maximize image quality. 2. IUD in place 3. 11 mm RIGHT pelvic sidewall lymph node other smaller lymph nodes in the pelvis. This finding is nonspecific but suspicious in light of gynecologic neoplasm. PET imaging could be considered for further evaluation.   07/22/2022 Initial Diagnosis   Uterine cancer ([place])   08/21/2022 Imaging   Lack of intravenous contrast administration and photon attenuation related to patient habitus limit evaluation of the thoracic inlet and pelvis. Within this context:   1. IUD in place in the uterus. Pelvic structures not well evaluated on this examination. 2. Pelvic sidewall lymph node seen on prior MRI is not identified on this examination, likely secondary to artifact from photon attenuation. 3. Enlarged periportal lymph nodes measure up to 16 mm in short axis, nonspecific and commonly reactive. However metastatic nodal disease while not favored is not excluded. Consider attention on follow-up imaging. 4. Lobular enlargement of the thyroid gland measuring up to 2.7 cm, suggest  further evaluation with dedicated thyroid ultrasound. 5. Soft tissue nodule anterior to the great vessels and subjacent to the thyroid gland measuring 2.7 x 1.8 cm, is nonspecific possibly reflecting an enlarged lymph node or parathyroid adenoma. Consider further evaluation with contrast enhanced CT of the neck. 6. Stranding in the anterior abdominal wall, nonspecific but possibly reflecting cellulitis. 7.  Aortic Atherosclerosis (ICD10-I70.0).   08/21/2022 Cancer Staging   Staging form: Corpus Uteri - Carcinoma and Carcinosarcoma, AJCC 8th Edition - Clinical stage from 08/21/2022: Stage IIIC2 (cT1, cN2a, cM0) - Signed by Heath Lark, MD on 08/21/2022 Stage prefix: Initial diagnosis     MEDICAL HISTORY:  Past Medical History:  Diagnosis Date   Arthritis    Asthma    Cancer ([employer])    ovarian cancer   Depression    Diabetes mellitus without complication ([place])    oral meds only   Gout    HTN (hypertension)    "borderline" med was stopped by pt- MD aware.   Neuromuscular disorder (H[facility])    neuropathy -hands/ feet   Neuropathy    Obesity    BMI >76.6   Pneumonia     SURGICAL HISTORY: Past Surgical History:  Procedure Laterality Date   FOOT SURGERY     due to needle break in foot   HYSTEROSCOPY WITH D & C N/A 06/04/2022   Procedure: DILATATION AND CURETTAGE;  Surgeon: Bernadene Bell, MD;  Location: [facility];  Service: Gynecology;  Laterality: N/A;   INTRAUTERINE DEVICE (IUD) INSERTION N/A 06/04/2022   Procedure: MIRENA INTRAUTERINE DEVICE (IUD) INSERTION;  Surgeon: Bernadene Bell, MD;  Location: [facility];  Service: Gynecology;  Laterality: N/A;   MULTIPLE TOOTH EXTRACTIONS     TONSILLECTOMY      SOCIAL HISTORY: Social History   Socioeconomic History   Marital status: Single    Spouse name: Not on file   Number of children: 0   Years of education: Not on file   Highest education level: Not on file  Occupational History   Occupation: disabled  Tobacco Use   Smoking  status: Every Day    Packs/day: .4    Types: Cigarettes    Start date: 06/10/1978   Smokeless tobacco: Never   Tobacco comments:    down to 5 per day - Previous 7-10  Vaping Use   Vaping Use: Never used  Substance and Sexual Activity   Alcohol use: No    Alcohol/week: 0.0 standard drinks of alcohol   Drug use: No   Sexual activity: Not Currently  Other Topics Concern   Not on file  Social History Narrative         Past Medical History:      Irregular menstrual periods      Asthma         Social History:      Disabled?,  Weight and OA; Moved to [place] 6 yrs ago.  Single         Social Determinants of Health   Financial Resource Strain: Not on file  Food Insecurity: No Food Insecurity (06/05/2022)   Hunger Vital Sign    Worried About Running Out of Food in the Last Year: Never true    Ran Out of Food in the Last Year: Never true  Transportation Needs: No Transportation Needs (06/05/2022)   PRAPARE - Hydrologist (Medical): No    Lack of Transportation (Non-Medical): No  Physical Activity: Not on file  Stress: Not on file  Social Connections: Not on file  Intimate Partner Violence: Not At Risk (06/05/2022)   Humiliation, Afraid, Rape, and Kick questionnaire    Fear of Current or Ex-Partner: No    Emotionally Abused: No    Physically Abused: No    Sexually Abused: No    FAMILY HISTORY: Family History  Problem Relation Age of Onset   Heart attack Mother    Stroke Mother    Gout Mother    Hypertension Mother    Lung cancer Father 60 - 39       smoked   Glaucoma Father    Heart disease Sister        fluid    Other Sister        spinal disease   Throat cancer Sister    Diabetes Brother    Prostate cancer Maternal Uncle    Ovarian cancer Maternal Grandmother    Dementia Maternal Grandfather    Glaucoma Other        nephew   Seizures Other        niece   Colon cancer Neg Hx    Breast cancer Neg Hx    Endometrial cancer Neg Hx  Pancreatic cancer Neg Hx     ALLERGIES:  is allergic to penicillins.  MEDICATIONS:  Current Outpatient Medications  Medication Sig Dispense Refill   letrozole (FEMARA) 2.5 MG tablet Take 1 tablet (2.5 mg total) by mouth daily. 30 tablet 1   TRULICITY [id number] SOPN ADMINISTER 0.75 MG UNDER THE SKIN 1 TIME A WEEK 2 mL 0   albuterol (PROVENTIL) (2.5 MG/3ML) 0.083% nebulizer solution Take 3 mLs (2.5 mg total) by nebulization every 6 (six) hours as needed for wheezing or shortness of breath. 150 mL 1   gabapentin (NEURONTIN) 100 MG capsule Take 1 capsule (100 mg total) by mouth 3 (three) times daily. (Patient taking differently: Take 100 mg by mouth daily.) 90 capsule 0   LORazepam (ATIVAN) 0.5 MG tablet Take 1 tablet (0.5 mg total) by mouth every 8 (eight) hours. 30 tablet 0   metFORMIN (GLUCOPHAGE-XR) 500 MG 24 hr tablet TAKE 1 TABLET(500 MG) BY MOUTH IN THE MORNING AND AT BEDTIME 60 tablet 0   nystatin (MYCOSTATIN/NYSTOP) powder Apply 1 Application topically 3 (three) times daily as needed. (Patient taking differently: Apply 1 Application topically daily.) 60 g 5   PROAIR HFA 108 (90 Base) MCG/ACT inhaler INHALE 2 PUFFS BY MOUTH EVERY 6 HOURS AS NEEDED FOR SHORTNESS OF BREATH. PLEASE MAKE AN APPOINTMENT 17 g 0   TRULICITY 1.5 0000000 SOPN ADMINISTER 1.5 MG UNDER THE SKIN 1 TIME A WEEK 2 mL 0   No current facility-administered medications for this visit.    REVIEW OF SYSTEMS:   Constitutional: Denies fevers, chills or abnormal night sweats Eyes: Denies blurriness of vision, double vision or watery eyes Ears, nose, mouth, throat, and face: Denies mucositis or sore throat Respiratory: Denies cough, dyspnea or wheezes Cardiovascular: Denies palpitation, chest discomfort or lower extremity swelling Gastrointestinal:  Denies nausea, heartburn or change in bowel habits Skin: Denies abnormal skin rashes Lymphatics: Denies new lymphadenopathy or easy bruising Neurological:Denies numbness,  tingling or new weaknesses Behavioral/Psych: Mood is stable, no new changes  All other systems were reviewed with the patient and are negative.  PHYSICAL EXAMINATION: ECOG PERFORMANCE STATUS: 2 - Symptomatic, <50% confined to bed  Vitals:   08/22/22 1311  BP: (!) 154/75  Pulse: 77  Resp: 20  Temp: 98.5 F (36.9 C)  SpO2: 94%   Filed Weights   08/22/22 1311  Weight: (!) 422 lb 1.6 oz (191.5 kg)    GENERAL:alert, no distress and comfortable  NEURO: no focal motor/sensory deficits  LABORATORY DATA:  I have reviewed the data as listed Lab Results  Component Value Date   WBC 11.3 (H) 06/08/2022   HGB 8.6 (L) 06/08/2022   HCT 30.7 (L) 06/08/2022   MCV 82.7 06/08/2022   PLT 259 06/08/2022   Recent Labs    01/02/22 1239 05/22/22 1344 06/04/22 1854 06/05/22 0607 06/06/22 0511 06/07/22 0447 06/08/22 0535  NA 138   < > 135 137 137 135 138  K 4.1   < > 5.0 5.5* 5.7* 5.5* 4.3  CL 98   < > 100 104 104 100 100  CO2 28   < > '27 29 29 31 '$ 34*  GLUCOSE 162*   < > 197* 230* 215* 160* 108*  BUN 6*   < > 8 12 24* 22* 21*  CREATININE 0.71   < > 0.82 0.77 0.85 0.90 0.85  CALCIUM 8.9   < > 8.3* 8.5* 8.6* 8.8* 8.7*  GFRNONAA  --    < > >60 >60 >60 >60 >  60  PROT 7.8  --  9.0* 8.6*  --   --   --   ALBUMIN 4.0  --  3.3* 3.2*  --   --   --   AST 19  --  22 16  --   --   --   ALT 16  --  17 14  --   --   --   ALKPHOS 131*  --  75 74  --   --   --   BILITOT 0.2  --  0.5 0.3  --   --   --    < > = values in this interval not displayed.    RADIOGRAPHIC STUDIES: I have personally reviewed the radiological images as listed and agreed with the findings in the report. CT CHEST ABDOMEN PELVIS WO CONTRAST  Result Date: 08/21/2022 CLINICAL DATA:  Cervical cancer, staging.  * Tracking Code: BO * EXAM: CT CHEST, ABDOMEN AND PELVIS WITHOUT CONTRAST TECHNIQUE: Multidetector CT imaging of the chest, abdomen and pelvis was performed following the standard protocol without IV contrast. RADIATION  DOSE REDUCTION: This exam was performed according to the departmental dose-optimization program which includes automated exposure control, adjustment of the mA and/or kV according to patient size and/or use of iterative reconstruction technique. COMPARISON:  MRI pelvis July 17, 2022. FINDINGS: CT CHEST FINDINGS Cardiovascular: Normal caliber thoracic aorta. Normal size heart. No significant pericardial effusion/thickening. Mediastinum/Nodes: Lack of intravenous contrast material and artifact related to patient habitus limit evaluation of the thoracic inlet. Within this context: Lobular enlargement of the thyroid gland measuring up to 2.7 cm on image 9/2. Soft tissue nodule anterior to the great vessels and subjacent to the thyroid measures 2.7 x 1.8 cm on image 13/2. Additional prominent mediastinal lymph nodes measure up to 8 mm in short axis on image 20/2. Prominent bilateral axillary lymph nodes measure up to 12 mm in short axis on image 20/2. Esophagus is grossly unremarkable. Lungs/Pleura: No suspicious pulmonary nodules or masses. Scattered linear bands of subsegmental atelectasis/scarring. No pleural effusion. No pneumothorax. Musculoskeletal: No aggressive lytic or blastic lesion of bone. Multilevel degenerative changes spine. CT ABDOMEN PELVIS FINDINGS Hepatobiliary: Unremarkable noncontrast enhanced appearance of the hepatic parenchyma. Gallbladder is unremarkable. No biliary ductal dilation. Pancreas: No pancreatic ductal dilation or evidence of acute inflammation. Spleen: No splenomegaly. Adrenals/Urinary Tract: Bilateral adrenal glands appear normal. No hydronephrosis. No renal, ureteral or bladder calculi. Evaluation of the urinary bladder is limited by photon attenuation secondary to patient habitus. Stomach/Bowel: Radiopaque enteric contrast material traverses the mid transverse colon. Stomach is unremarkable for degree of distension. No evidence of acute bowel inflammation. Vascular/Lymphatic:  Aortic atherosclerosis. Enlarged periportal lymph nodes measure up to 16 mm in short axis on image 66/2. Evaluation for pelvic adenopathy is limited by photon attenuation related to patient habitus Reproductive: Intrauterine device in place. Evaluation of the pelvic reproductive structures is limited by photon attenuation to patient habitus. Other: No significant abdominal free fluid. Stranding in the anterior abdominal wall. Musculoskeletal: No aggressive lytic or blastic lesion of bone although evaluation of the lower lumbar spine and sacrum are limited by photon attenuation. IMPRESSION: Lack of intravenous contrast administration and photon attenuation related to patient habitus limit evaluation of the thoracic inlet and pelvis. Within this context: 1. IUD in place in the uterus. Pelvic structures not well evaluated on this examination. 2. Pelvic sidewall lymph node seen on prior MRI is not identified on this examination, likely secondary to artifact from photon attenuation. 3. Enlarged  periportal lymph nodes measure up to 16 mm in short axis, nonspecific and commonly reactive. However metastatic nodal disease while not favored is not excluded. Consider attention on follow-up imaging. 4. Lobular enlargement of the thyroid gland measuring up to 2.7 cm, suggest further evaluation with dedicated thyroid ultrasound. 5. Soft tissue nodule anterior to the great vessels and subjacent to the thyroid gland measuring 2.7 x 1.8 cm, is nonspecific possibly reflecting an enlarged lymph node or parathyroid adenoma. Consider further evaluation with contrast enhanced CT of the neck. 6. Stranding in the anterior abdominal wall, nonspecific but possibly reflecting cellulitis. 7.  Aortic Atherosclerosis (ICD10-I70.0). Electronically Signed   By: Dahlia Bailiff M.D.   On: 08/21/2022 10:34

## 2022-08-22 NOTE — Progress Notes (Signed)
Requested ER/PR on accession [id number] with [facility] via email.

## 2022-08-22 NOTE — Assessment & Plan Note (Signed)
We discussed risk of weight gain with antiestrogen therapy The patient is going to discuss weight management with her primary care doctor

## 2022-08-22 NOTE — Assessment & Plan Note (Signed)
This is an incidental finding from recent CT imaging I recommend thyroid ultrasound plus minus biopsy Her TSH from December was normal

## 2022-08-22 NOTE — Telephone Encounter (Signed)
Called Deborah Benson and advised her of the thyroid US scheduled on 08/29/22 with arrival at [facility] at 11:15.  She verbalized understanding and agreement of the appointment date and time.

## 2022-08-22 NOTE — Assessment & Plan Note (Signed)
I have reviewed CT imaging with the patient Due to her significant health issues, I expressed major concern regarding the role of chemotherapy The patient understood why she is not a candidate for surgery or radiation treatment We discussed treatment options She has no recent vaginal bleeding since insertion of IUD I recommend antiestrogen therapy with letrozole We discussed the risk, benefits, side effects of therapy including hot flashes and weight gain We will get pathologist to add an additional testing to her tissue sample to quantify receptor positivity for estrogen and progesterone I recommend calcium and vitamin D to prevent risk of osteoporosis I plan to see her again in a month for further follow-up Recommend repeat CT imaging after 3 months of treatment

## 2022-08-26 ENCOUNTER — Encounter: Payer: Self-pay | Admitting: Genetic Counselor

## 2022-08-26 ENCOUNTER — Telehealth: Payer: Self-pay | Admitting: Genetic Counselor

## 2022-08-26 DIAGNOSIS — Z1379 Encounter for other screening for genetic and chromosomal anomalies: Secondary | ICD-10-CM | POA: Insufficient documentation

## 2022-08-26 NOTE — Telephone Encounter (Signed)
I contacted Ms. Pais to discuss her genetic testing results. No pathogenic variants were identified in the 71 genes analyzed. Detailed clinic note to follow.  The test report has been scanned into EPIC and is located under the Molecular Pathology section of the Results Review tab.  A portion of the result report is included below for reference.   Lucille Passy, MS, [facility] Genetic Counselor [email or web address] (P) [phone]

## 2022-08-27 LAB — SURGICAL PATHOLOGY

## 2022-08-28 ENCOUNTER — Inpatient Hospital Stay: Payer: Medicaid Other | Admitting: Licensed Clinical Social Worker

## 2022-08-28 NOTE — Progress Notes (Signed)
[place] Work  Initial Assessment   Deborah Benson is a 62 y.o. year old female contacted by phone. Clinical Social Work was referred by new patient protocol for assessment of psychosocial needs.   SDOH (Social Determinants of Health) assessments performed: Yes SDOH Interventions    Flowsheet Row Clinical Support from 08/28/2022 in [place] at [facility]  SDOH Interventions   Food Insecurity Interventions Intervention Not Indicated  Transportation Interventions Intervention Not Indicated  Utilities Interventions Intervention Not Indicated       SDOH Screenings   Food Insecurity: No Food Insecurity (08/28/2022)  Housing: Low Risk  (06/05/2022)  Transportation Needs: No Transportation Needs (08/28/2022)  Utilities: Not At Risk (08/28/2022)  Depression (PHQ2-9): Low Risk  (04/19/2022)  Tobacco Use: High Risk (08/22/2022)     Distress Screen completed: No     No data to display            Family/Social Information:  Family members/support persons in your life? Family and Investment banker, operational concerns: no  Financial concerns: No Type of concern: None Food access concerns: no Religious or spiritual practice: Yes-speaking with her pastor Services Currently in place:  Medicaid  Coping/ Adjustment to diagnosis: Patient understands treatment plan and what happens next? yes, had a hard time initially but feels she is doing better since speaking with her pastor and family and letting them know to be positive and not doom & gloom Patient reported stressors: Adjusting to my illness Current coping skills/ strengths: Ability for insight , Capable of independent living , Communication skills , and Supportive family/friends     SUMMARY: Current SDOH Barriers:  No major barriers noted today  Clinical Social Work Clinical Goal(s):  No clinical social work goals at this time  Interventions: Discussed common feeling and emotions when being  diagnosed with cancer, and the importance of support during treatment Informed patient of the support team roles and support services at [facility] Provided [place] contact information and encouraged patient to call with any questions or concerns   Follow Up Plan: Patient will contact CSW with any support or resource needs Patient verbalizes understanding of plan: Yes    Deborah Benson E Jonavan Vanhorn, LCSW   Patient is participating in a Managed Medicaid Plan:  Yes

## 2022-08-29 ENCOUNTER — Other Ambulatory Visit: Payer: Self-pay | Admitting: Hematology and Oncology

## 2022-08-29 ENCOUNTER — Telehealth: Payer: Self-pay | Admitting: Oncology

## 2022-08-29 ENCOUNTER — Ambulatory Visit (HOSPITAL_COMMUNITY)
Admission: RE | Admit: 2022-08-29 | Discharge: 2022-08-29 | Disposition: A | Discharge status: Home / Self Care | Payer: Medicaid Other | Source: Ambulatory Visit | Attending: Hematology and Oncology | Admitting: Hematology and Oncology

## 2022-08-29 DIAGNOSIS — E041 Nontoxic single thyroid nodule: Secondary | ICD-10-CM | POA: Diagnosis not present

## 2022-08-29 NOTE — Telephone Encounter (Signed)
Called Deborah Benson and let her know the thyroid ultrasound results are back and radiology is recommending more tests.  Briefly reviewed the report with her and advised that Dr. Alvy Bimler is referring her to an endocrinologist for further management.  She verbalized understanding and agreement.

## 2022-09-02 ENCOUNTER — Encounter: Payer: Self-pay | Admitting: Genetic Counselor

## 2022-09-02 ENCOUNTER — Ambulatory Visit: Payer: Self-pay | Admitting: Genetic Counselor

## 2022-09-02 DIAGNOSIS — Z1379 Encounter for other screening for genetic and chromosomal anomalies: Secondary | ICD-10-CM

## 2022-09-02 NOTE — Progress Notes (Signed)
HPI:   Deborah Benson was previously seen in the [place] clinic due to a personal and family history of cancer and concerns regarding a hereditary predisposition to cancer. Please refer to our prior cancer genetics clinic note for more information regarding our discussion, assessment and recommendations, at the time. Ms. Grall recent genetic test results were disclosed to her, as were recommendations warranted by these results. These results and recommendations are discussed in more detail below.  CANCER HISTORY:  Oncology History Overview Note  MSI stable, ER 95%, PR 100%   Uterine cancer ([place])  04/17/2022 Imaging   US pelvis 1. Prominent abnormal endometrial thickening (41 mm). In the setting of post-menopausal bleeding, endometrial sampling is indicated to exclude carcinoma. If results are benign, sonohysterogram should be considered for focal lesion work-up.  2. Nonvisualization of the ovaries. No adnexal masses.   06/04/2022 Pathology Results   A. 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  See comment.    07/17/2022 Imaging   MRI pelvis 1. Maximal thickness of the endometrial canal approaches 2.5 cm, compared to gross thickening up to 4 cm on the previous exam. Presence is suspicious for residual tumor in the endometrium but shows improvement since previous imaging. Exam quality is compromised by patient's body habitus. Depth of myometrial invasion is difficult to assess on the current study but there is certainly no gross invasion beyond 50%. If additional MRI follow-up is desired direct involvement from a radiologist for protocol prescription may be helpful to mitigate artifacts and maximize image quality. 2. IUD in place 3. 11 mm RIGHT pelvic sidewall lymph node other smaller lymph nodes in the pelvis. This finding is  nonspecific but suspicious in light of gynecologic neoplasm. PET imaging could be considered for further evaluation.   07/22/2022 Initial Diagnosis   Uterine cancer ([place])   08/21/2022 Imaging   Lack of intravenous contrast administration and photon attenuation related to patient habitus limit evaluation of the thoracic inlet and pelvis. Within this context:   1. IUD in place in the uterus. Pelvic structures not well evaluated on this examination. 2. Pelvic sidewall lymph node seen on prior MRI is not identified on this examination, likely secondary to artifact from photon attenuation. 3. Enlarged periportal lymph nodes measure up to 16 mm in short axis, nonspecific and commonly reactive. However metastatic nodal disease while not favored is not excluded. Consider attention on follow-up imaging. 4. Lobular enlargement of the thyroid gland measuring up to 2.7 cm, suggest further evaluation with dedicated thyroid ultrasound. 5. Soft tissue nodule anterior to the great vessels and subjacent to the thyroid gland measuring 2.7 x 1.8 cm, is nonspecific possibly reflecting an enlarged lymph node or parathyroid adenoma. Consider further evaluation with contrast enhanced CT of the neck. 6. Stranding in the anterior abdominal wall, nonspecific but possibly reflecting cellulitis. 7.  Aortic Atherosclerosis (ICD10-I70.0).   08/21/2022 Cancer Staging   Staging form: Corpus Uteri - Carcinoma and Carcinosarcoma, AJCC 8th Edition - Clinical stage from 08/21/2022: Stage IIIC2 (cT1, cN2a, cM0) - Signed by Heath Lark, MD on 08/21/2022 Stage prefix: Initial diagnosis    Genetic Testing   [facility] CancerNext-Expanded Panel+RNA was Negative. Report date is 08/23/2022.  The CancerNext-Expanded gene panel offered by [facility] and includes sequencing, rearrangement, and RNA analysis for the following 71 genes: AIP, ALK, APC, ATM, AXIN2, BAP1, BARD1, BMPR1A, BRCA1, BRCA2, BRIP1, CDC73, CDH1, CDK4, CDKN1B, CDKN2A, CHEK2,  CTNNA1, DICER1,  FH, FLCN, KIF1B, LZTR1, MAX, MEN1, MET, MLH1, MSH2, MSH3, MSH6, MUTYH, NF1, NF2, NTHL1, PALB2, PHOX2B, PMS2, POT1, PRKAR1A, PTCH1, PTEN, RAD51C, RAD51D, RB1, RET, SDHA, SDHAF2, SDHB, SDHC, SDHD, SMAD4, SMARCA4, SMARCB1, SMARCE1, STK11, SUFU, TMEM127, TP53, TSC1, TSC2, and VHL (sequencing and deletion/duplication); EGFR, EGLN1, HOXB13, KIT, MITF, PDGFRA, POLD1, and POLE (sequencing only); EPCAM and GREM1 (deletion/duplication only).    08/29/2022 Imaging   1. Thyromegaly with findings suggestive of multinodular goiter. 2. Nodules labeled #1 and #4 both meet imaging criteria to recommend percutaneous sampling as indicated. 3. Nodule #3 meets imaging criteria to recommend a 1 year follow-up.  4. Note, the substernal nodule demonstrated on recent chest CT was not visualized sonographically due to substernal location. Further evaluation could be performed with nuclear medicine thyroid uptake and scan (to evaluate if this substernal tissue represents exophytic or ectopic thyroid parenchyma) versus contrast-enhanced neck CT as indicated.     FAMILY HISTORY:  We obtained a detailed, 4-generation family history.  Significant diagnoses are listed below:      Family History  Problem Relation Age of Onset   Heart attack Mother     Stroke Mother     Gout Mother     Hypertension Mother     Lung cancer Father     Glaucoma Father     Heart disease Sister          fluid    Other Sister          spinal disease   Diabetes Brother     Cancer - Ovarian Maternal Grandmother          declined treatment   Dementia Maternal Grandfather     Glaucoma Other          nephew   Seizures Other          niece   Colon cancer Neg Hx     Breast cancer Neg Hx     Endometrial cancer Neg Hx     Prostate cancer Neg Hx     Pancreatic cancer Neg Hx         Ms. Kerins's maternal half-sister was diagnosed with throat cancer, she smoked and died at age 5. Her maternal uncle was diagnosed with prostate  cancer. Her maternal grandmother was diagnosed with ovarian cancer in her late 69s, she died shortly after her diagnosis. Ms. Frenz's father was diagnosed with lung cancer in his 81s, he was a heavy smoker and died in his 60s. Ms. Hersey is unaware of previous family history of genetic testing for hereditary cancer risks. There is no reported Ashkenazi Jewish ancestry.    GENETIC TEST RESULTS:  The [facility] CancerNext-Expanded Panel found no pathogenic mutations.  The CancerNext-Expanded gene panel offered by [facility] and includes sequencing, rearrangement, and RNA analysis for the following 71 genes: AIP, ALK, APC, ATM, AXIN2, BAP1, BARD1, BMPR1A, BRCA1, BRCA2, BRIP1, CDC73, CDH1, CDK4, CDKN1B, CDKN2A, CHEK2, CTNNA1, DICER1, FH, FLCN, KIF1B, LZTR1, MAX, MEN1, MET, MLH1, MSH2, MSH3, MSH6, MUTYH, NF1, NF2, NTHL1, PALB2, PHOX2B, PMS2, POT1, PRKAR1A, PTCH1, PTEN, RAD51C, RAD51D, RB1, RET, SDHA, SDHAF2, SDHB, SDHC, SDHD, SMAD4, SMARCA4, SMARCB1, SMARCE1, STK11, SUFU, TMEM127, TP53, TSC1, TSC2, and VHL (sequencing and deletion/duplication); EGFR, EGLN1, HOXB13, KIT, MITF, PDGFRA, POLD1, and POLE (sequencing only); EPCAM and GREM1 (deletion/duplication only).   The test report has been scanned into EPIC and is located under the Molecular Pathology section of the Results Review tab.  A portion of the result report is included below for reference. Genetic  testing reported out on 08/23/2022.       Even though a pathogenic variant was not identified, possible explanations for the cancer in the family may include: There may be no hereditary risk for cancer in the family. The cancers in Ms. Zales and/or her family may be due to other genetic or environmental factors. There may be a gene mutation in one of these genes that current testing methods cannot detect, but that chance is small. There could be another gene that has not yet been discovered, or that we have not yet tested, that is responsible for  the cancer diagnoses in the family.  It is also possible there is a hereditary cause for the cancer in the family that Ms. Terrio did not inherit.  Therefore, it is important to remain in touch with cancer genetics in the future so that we can continue to offer Ms. Dembek the most up to date genetic testing.   ADDITIONAL GENETIC TESTING:  We discussed with Ms. Hemmingway that her genetic testing was fairly extensive.  If there are genes identified to increase cancer risk that can be analyzed in the future, we would be happy to discuss and coordinate this testing at that time.    CANCER SCREENING RECOMMENDATIONS:  Ms. Hires test result is considered negative (normal).  This means that we have not identified a hereditary cause for her personal and family history of cancer at this time. Most cancers happen by chance and this negative test suggests that her cancer may fall into this category.    An individual's cancer risk and medical management are not determined by genetic test results alone. Overall cancer risk assessment incorporates additional factors, including personal medical history, family history, and any available genetic information that may result in a personalized plan for cancer prevention and surveillance. Therefore, it is recommended she continue to follow the cancer management and screening guidelines provided by her oncology and primary healthcare provider.  RECOMMENDATIONS FOR FAMILY MEMBERS:   Other members of the family may still carry a pathogenic variant in one of these genes that Ms. Hills did not inherit. Based on the family history, we recommend her mother and maternal uncle who has prostate cancer consider genetic counseling and testing.   FOLLOW-UP:  Cancer genetics is a rapidly advancing field and it is possible that new genetic tests will be appropriate for her and/or her family members in the future. We encouraged her to remain in contact with cancer genetics  on an annual basis so we can update her personal and family histories and let her know of advances in cancer genetics that may benefit this family.   Our contact number was provided. Ms. Wrzesinski questions were answered to her satisfaction, and she knows she is welcome to call us at anytime with additional questions or concerns.   Lucille Passy, MS, [facility] Genetic Counselor [email or web address] (P) [phone]

## 2022-09-13 ENCOUNTER — Other Ambulatory Visit: Payer: Self-pay | Admitting: Family Medicine

## 2022-09-13 DIAGNOSIS — E119 Type 2 diabetes mellitus without complications: Secondary | ICD-10-CM

## 2022-09-16 ENCOUNTER — Telehealth: Payer: Self-pay | Admitting: Oncology

## 2022-09-16 NOTE — Telephone Encounter (Signed)
[facility] regarding referral status.  They said they are not accepting new patients at this time.  They will send the referral to Dr. Shawnee Knapp at [facility] [place] office.

## 2022-09-19 ENCOUNTER — Inpatient Hospital Stay: Payer: Medicaid Other | Admitting: Hematology and Oncology

## 2022-09-19 ENCOUNTER — Telehealth: Payer: Self-pay | Admitting: Hematology and Oncology

## 2022-09-19 NOTE — Telephone Encounter (Signed)
Patient called to r/s 4/11 appointment due to pain. Patient rescheduled and notified.

## 2022-09-26 ENCOUNTER — Encounter: Payer: Self-pay | Admitting: Oncology

## 2022-09-26 ENCOUNTER — Other Ambulatory Visit: Payer: Self-pay

## 2022-09-26 ENCOUNTER — Inpatient Hospital Stay: Payer: Medicaid Other | Attending: Psychiatry | Admitting: Hematology and Oncology

## 2022-09-26 ENCOUNTER — Encounter: Payer: Self-pay | Admitting: Psychiatry

## 2022-09-26 ENCOUNTER — Encounter: Payer: Self-pay | Admitting: Hematology and Oncology

## 2022-09-26 VITALS — BP 139/75 | HR 80 | Temp 97.6°F | Wt >= 6400 oz

## 2022-09-26 DIAGNOSIS — E041 Nontoxic single thyroid nodule: Secondary | ICD-10-CM | POA: Diagnosis not present

## 2022-09-26 DIAGNOSIS — C541 Malignant neoplasm of endometrium: Secondary | ICD-10-CM | POA: Insufficient documentation

## 2022-09-26 DIAGNOSIS — J45909 Unspecified asthma, uncomplicated: Secondary | ICD-10-CM | POA: Diagnosis not present

## 2022-09-26 DIAGNOSIS — Z6841 Body Mass Index (BMI) 40.0 and over, adult: Secondary | ICD-10-CM | POA: Insufficient documentation

## 2022-09-26 DIAGNOSIS — Z79811 Long term (current) use of aromatase inhibitors: Secondary | ICD-10-CM | POA: Insufficient documentation

## 2022-09-26 DIAGNOSIS — Z79890 Hormone replacement therapy (postmenopausal): Secondary | ICD-10-CM | POA: Insufficient documentation

## 2022-09-26 DIAGNOSIS — C549 Malignant neoplasm of corpus uteri, unspecified: Secondary | ICD-10-CM | POA: Diagnosis not present

## 2022-09-26 DIAGNOSIS — Z975 Presence of (intrauterine) contraceptive device: Secondary | ICD-10-CM | POA: Diagnosis not present

## 2022-09-26 NOTE — Progress Notes (Signed)
Called Dr. Lacie Draft office with [facility] regarding referral.  They have the referral and are going to call the patient to schedule today.

## 2022-09-26 NOTE — Addendum Note (Signed)
Addended by: Morrell Riddle on: 09/26/2022 10:30 AM   Modules accepted: Orders

## 2022-09-26 NOTE — Assessment & Plan Note (Signed)
We will double check on status of endocrinology referral

## 2022-09-26 NOTE — Assessment & Plan Note (Signed)
Overall, she tolerated treatment well except for hot flashes She has not gained weight while on Femara I recommend CT imaging in June for objective assessment of response to therapy

## 2022-09-26 NOTE — Progress Notes (Signed)
[facility] OFFICE PROGRESS NOTE  Patient Care Team: Carney Living, MD as PCP - General  ASSESSMENT & PLAN:  Uterine cancer (HCC) Overall, she tolerated treatment well except for hot flashes She has not gained weight while on Femara I recommend CT imaging in June for objective assessment of response to therapy  Left thyroid nodule We will double check on status of endocrinology referral  BMI 70 and over, adult [facility]) She will continue on her weight loss journey with medication assistance  Orders Placed This Encounter  Procedures   CT CHEST ABDOMEN PELVIS W CONTRAST    Standing Status:   Future    Standing Expiration Date:   09/26/2023    Order Specific Question:   If indicated for the ordered procedure, I authorize the administration of contrast media per Radiology protocol    Answer:   Yes    Order Specific Question:   Does the patient have a contrast media/X-ray dye allergy?    Answer:   No    Order Specific Question:   Is patient pregnant?    Answer:   No    Order Specific Question:   Preferred imaging location?    Answer:   [facility]    Order Specific Question:   If indicated for the ordered procedure, I authorize the administration of oral contrast media per Radiology protocol    Answer:   Yes    All questions were answered. The patient knows to call the clinic with any problems, questions or concerns. The total time spent in the appointment was 25 minutes encounter with patients including review of chart and various tests results, discussions about plan of care and coordination of care plan   Artis Delay, MD 09/26/2022 10:20 AM  INTERVAL HISTORY: Please see below for problem oriented charting. she returns for treatment follow-up on Femara She complains of hot flashes She is losing weight on weight loss program Otherwise, she tolerated treatment well Denies recent vaginal bleeding  REVIEW OF SYSTEMS:   Constitutional: Denies fevers,  chills or abnormal weight loss Eyes: Denies blurriness of vision Ears, nose, mouth, throat, and face: Denies mucositis or sore throat Respiratory: Denies cough, dyspnea or wheezes Cardiovascular: Denies palpitation, chest discomfort or lower extremity swelling Gastrointestinal:  Denies nausea, heartburn or change in bowel habits Skin: Denies abnormal skin rashes Lymphatics: Denies new lymphadenopathy or easy bruising Neurological:Denies numbness, tingling or new weaknesses Behavioral/Psych: Mood is stable, no new changes  All other systems were reviewed with the patient and are negative.  I have reviewed the past medical history, past surgical history, social history and family history with the patient and they are unchanged from previous note.  ALLERGIES:  is allergic to penicillins.  MEDICATIONS:  Current Outpatient Medications  Medication Sig Dispense Refill   cholecalciferol (VITAMIN D3) 25 MCG (1000 UNIT) tablet Take 1,000 Units by mouth daily.     TRULICITY 0.75 MG/0.5ML SOPN ADMINISTER 0.75 MG UNDER THE SKIN 1 TIME A WEEK 2 mL 0   albuterol (PROVENTIL) (2.5 MG/3ML) 0.083% nebulizer solution Take 3 mLs (2.5 mg total) by nebulization every 6 (six) hours as needed for wheezing or shortness of breath. 150 mL 1   gabapentin (NEURONTIN) 100 MG capsule Take 1 capsule (100 mg total) by mouth 3 (three) times daily. (Patient taking differently: Take 100 mg by mouth daily.) 90 capsule 0   letrozole (FEMARA) 2.5 MG tablet Take 1 tablet (2.5 mg total) by mouth daily. 30 tablet 1  LORazepam (ATIVAN) 0.5 MG tablet Take 1 tablet (0.5 mg total) by mouth every 8 (eight) hours. 30 tablet 0   metFORMIN (GLUCOPHAGE-XR) 500 MG 24 hr tablet TAKE 1 TABLET(500 MG) BY MOUTH IN THE MORNING AND AT BEDTIME 60 tablet 0   nystatin (MYCOSTATIN/NYSTOP) powder Apply 1 Application topically 3 (three) times daily as needed. (Patient taking differently: Apply 1 Application topically daily.) 60 g 5   PROAIR HFA 108 (90  Base) MCG/ACT inhaler INHALE 2 PUFFS BY MOUTH EVERY 6 HOURS AS NEEDED FOR SHORTNESS OF BREATH. PLEASE MAKE AN APPOINTMENT 17 g 0   TRULICITY 1.5 MG/0.5ML SOPN ADMINISTER 1.5 MG UNDER THE SKIN 1 TIME A WEEK 2 mL 0   No current facility-administered medications for this visit.    SUMMARY OF ONCOLOGIC HISTORY: Oncology History Overview Note  MSI stable, ER 95%, PR 100% Genetics neg   Uterine cancer  04/17/2022 Imaging   US pelvis 1. Prominent abnormal endometrial thickening (41 mm). In the setting of post-menopausal bleeding, endometrial sampling is indicated to exclude carcinoma. If results are benign, sonohysterogram should be considered for focal lesion work-up.  2. Nonvisualization of the ovaries. No adnexal masses.   06/04/2022 Pathology Results   A. 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  See comment.    07/17/2022 Imaging   MRI pelvis 1. Maximal thickness of the endometrial canal approaches 2.5 cm, compared to gross thickening up to 4 cm on the previous exam. Presence is suspicious for residual tumor in the endometrium but shows improvement since previous imaging. Exam quality is compromised by patient's body habitus. Depth of myometrial invasion is difficult to assess on the current study but there is certainly no gross invasion beyond 50%. If additional MRI follow-up is desired direct involvement from a radiologist for protocol prescription may be helpful to mitigate artifacts and maximize image quality. 2. IUD in place 3. 11 mm RIGHT pelvic sidewall lymph node other smaller lymph nodes in the pelvis. This finding is nonspecific but suspicious in light of gynecologic neoplasm. PET imaging could be considered for further evaluation.   07/22/2022 Initial Diagnosis   Uterine cancer (HCC)   08/21/2022 Imaging   Lack of intravenous contrast  administration and photon attenuation related to patient habitus limit evaluation of the thoracic inlet and pelvis. Within this context:   1. IUD in place in the uterus. Pelvic structures not well evaluated on this examination. 2. Pelvic sidewall lymph node seen on prior MRI is not identified on this examination, likely secondary to artifact from photon attenuation. 3. Enlarged periportal lymph nodes measure up to 16 mm in short axis, nonspecific and commonly reactive. However metastatic nodal disease while not favored is not excluded. Consider attention on follow-up imaging. 4. Lobular enlargement of the thyroid gland measuring up to 2.7 cm, suggest further evaluation with dedicated thyroid ultrasound. 5. Soft tissue nodule anterior to the great vessels and subjacent to the thyroid gland measuring 2.7 x 1.8 cm, is nonspecific possibly reflecting an enlarged lymph node or parathyroid adenoma. Consider further evaluation with contrast enhanced CT of the neck. 6. Stranding in the anterior abdominal wall, nonspecific but possibly reflecting cellulitis. 7.  Aortic Atherosclerosis (ICD10-I70.0).   08/21/2022 Cancer Staging   Staging form: Corpus Uteri - Carcinoma and Carcinosarcoma, AJCC 8th Edition - Clinical stage from 08/21/2022: Stage IIIC2 (cT1, cN2a, cM0) - Signed by Artis Delay, MD on 08/21/2022 Stage prefix: Initial diagnosis    Genetic  Testing   [facility] CancerNext-Expanded Panel+RNA was Negative. Report date is 08/23/2022.  The CancerNext-Expanded gene panel offered by [facility] and includes sequencing, rearrangement, and RNA analysis for the following 71 genes: AIP, ALK, APC, ATM, AXIN2, BAP1, BARD1, BMPR1A, BRCA1, BRCA2, BRIP1, CDC73, CDH1, CDK4, CDKN1B, CDKN2A, CHEK2, CTNNA1, DICER1, FH, FLCN, KIF1B, LZTR1, MAX, MEN1, MET, MLH1, MSH2, MSH3, MSH6, MUTYH, NF1, NF2, NTHL1, PALB2, PHOX2B, PMS2, POT1, PRKAR1A, PTCH1, PTEN, RAD51C, RAD51D, RB1, RET, SDHA, SDHAF2, SDHB, SDHC, SDHD, SMAD4, SMARCA4,  SMARCB1, SMARCE1, STK11, SUFU, TMEM127, TP53, TSC1, TSC2, and VHL (sequencing and deletion/duplication); EGFR, EGLN1, HOXB13, KIT, MITF, PDGFRA, POLD1, and POLE (sequencing only); EPCAM and GREM1 (deletion/duplication only).    08/23/2022 -  Anti-estrogen oral therapy   She is started on Femara   08/29/2022 Imaging   1. Thyromegaly with findings suggestive of multinodular goiter. 2. Nodules labeled #1 and #4 both meet imaging criteria to recommend percutaneous sampling as indicated. 3. Nodule #3 meets imaging criteria to recommend a 1 year follow-up.  4. Note, the substernal nodule demonstrated on recent chest CT was not visualized sonographically due to substernal location. Further evaluation could be performed with nuclear medicine thyroid uptake and scan (to evaluate if this substernal tissue represents exophytic or ectopic thyroid parenchyma) versus contrast-enhanced neck CT as indicated.     PHYSICAL EXAMINATION: ECOG PERFORMANCE STATUS: 2 - Symptomatic, <50% confined to bed  Vitals:   09/26/22 1006  BP: 139/75  Pulse: 80  Temp: 97.6 F (36.4 C)  SpO2: 96%   Filed Weights   09/26/22 1006  Weight: (!) 414 lb (187.8 kg)    GENERAL:alert, no distress and comfortable  NEURO: alert & oriented x 3 with fluent speech, no focal motor/sensory deficits  LABORATORY DATA:  I have reviewed the data as listed    Component Value Date/Time   NA 138 06/08/2022 0535   NA 138 01/02/2022 1239   K 4.3 06/08/2022 0535   CL 100 06/08/2022 0535   CO2 34 (H) 06/08/2022 0535   GLUCOSE 108 (H) 06/08/2022 0535   BUN 21 (H) 06/08/2022 0535   BUN 6 (L) 01/02/2022 1239   CREATININE 0.85 06/08/2022 0535   CREATININE 0.87 10/25/2015 1223   CALCIUM 8.7 (L) 06/08/2022 0535   PROT 8.6 (H) 06/05/2022 0607   PROT 7.8 01/02/2022 1239   ALBUMIN 3.2 (L) 06/05/2022 0607   ALBUMIN 4.0 01/02/2022 1239   AST 16 06/05/2022 0607   ALT 14 06/05/2022 0607   ALKPHOS 74 06/05/2022 0607   BILITOT 0.3  06/05/2022 0607   BILITOT 0.2 01/02/2022 1239   GFRNONAA >60 06/08/2022 0535   GFRAA 104 12/07/2019 1553    No results found for: "SPEP", "UPEP"  Lab Results  Component Value Date   WBC 11.3 (H) 06/08/2022   NEUTROABS 10.7 (H) 06/04/2022   HGB 8.6 (L) 06/08/2022   HCT 30.7 (L) 06/08/2022   MCV 82.7 06/08/2022   PLT 259 06/08/2022      Chemistry      Component Value Date/Time   NA 138 06/08/2022 0535   NA 138 01/02/2022 1239   K 4.3 06/08/2022 0535   CL 100 06/08/2022 0535   CO2 34 (H) 06/08/2022 0535   BUN 21 (H) 06/08/2022 0535   BUN 6 (L) 01/02/2022 1239   CREATININE 0.85 06/08/2022 0535   CREATININE 0.87 10/25/2015 1223      Component Value Date/Time   CALCIUM 8.7 (L) 06/08/2022 0535   ALKPHOS 74 06/05/2022 0607   AST 16 06/05/2022  1610   ALT 14 06/05/2022 0607   BILITOT 0.3 06/05/2022 0607   BILITOT 0.2 01/02/2022 1239       RADIOGRAPHIC STUDIES: I have personally reviewed the radiological images as listed and agreed with the findings in the report. US THYROID  Result Date: 08/29/2022 CLINICAL DATA:  Incidental on CT. Questioned left-sided thyroid nodule on preceding chest CT EXAM: THYROID ULTRASOUND TECHNIQUE: Ultrasound examination of the thyroid gland and adjacent soft tissues was performed. COMPARISON:  CT the chest, abdomen and pelvis-08/19/2022 FINDINGS: Examination is degraded due to patient body habitus and poor sonographic window. Parenchymal Echotexture: Moderately heterogenous Isthmus: Enlarged measuring 1.1 cm in diameter Right lobe: Enlarged measuring 7.3 x 3.0 x 2.3 cm Left lobe: Enlarged measuring 7.7 x 3.4 x 2.4 cm _________________________________________________________ Estimated total number of nodules >/= 1 cm: 3 Number of spongiform nodules >/=  2 cm not described below (TR1): 0 Number of mixed cystic and solid nodules >/= 1.5 cm not described below (TR2): 0 _________________________________________________________ Nodule # 1: Location: Right;  Mid Maximum size: 3.0 cm; Other 2 dimensions: 2.6 x 1.7 cm Composition: solid/almost completely solid (2) Echogenicity: hypoechoic (2) Shape: not taller-than-wide (0) Margins: ill-defined (0) Echogenic foci: none (0) ACR TI-RADS total points: 4. ACR TI-RADS risk category: TR4 (4-6 points). ACR TI-RADS recommendations: **Given size (>/= 1.5 cm) and appearance, fine needle aspiration of this moderately suspicious nodule should be considered based on TI-RADS criteria. _________________________________________________________ There is a punctate (approximately 0.9 x 0.8 x 0.5 cm cyst within the right lobe of the thyroid (labeled 2), which contains an internal echogenic foci ring artifact compatible with benign colloid. This benign colloid containing cyst does not meet criteria to recommend percutaneous sampling or continued dedicated follow-up. _________________________________________________________ Nodule # 3: Location: Left; Inferior Maximum size: 1.4 cm; Other 2 dimensions: 1.3 x 1.0 cm Composition: solid/almost completely solid (2) Echogenicity: hypoechoic (2) Shape: not taller-than-wide (0) Margins: smooth (0) Echogenic foci: none (0) ACR TI-RADS total points: 4. ACR TI-RADS risk category: TR4 (4-6 points). ACR TI-RADS recommendations: *Given size (>/= 1 - 1.4 cm) and appearance, a follow-up ultrasound in 1 year should be considered based on TI-RADS criteria. _________________________________________________________ Nodule # 4: Location: Left; Inferior - correlates with the nodule seen on preceding chest CT Maximum size: 4.4 cm; Other 2 dimensions: 3.6 x 2.2 cm Composition: solid/almost completely solid (2) Echogenicity: isoechoic (1) Shape: not taller-than-wide (0) Margins: ill-defined (0) Echogenic foci: none (0) ACR TI-RADS total points: 3. ACR TI-RADS risk category: TR3 (3 points). ACR TI-RADS recommendations: **Given size (>/= 2.5 cm) and appearance, fine needle aspiration of this mildly suspicious nodule  should be considered based on TI-RADS criteria. _________________________________________________________ Note, the substernal nodule demonstrated on recent chest CT was not visualized sonographically due to substernal location. IMPRESSION: 1. Thyromegaly with findings suggestive of multinodular goiter. 2. Nodules labeled #1 and #4 both meet imaging criteria to recommend percutaneous sampling as indicated. 3. Nodule #3 meets imaging criteria to recommend a 1 year follow-up. 4. Note, the substernal nodule demonstrated on recent chest CT was not visualized sonographically due to substernal location. Further evaluation could be performed with nuclear medicine thyroid uptake and scan (to evaluate if this substernal tissue represents exophytic or ectopic thyroid parenchyma) versus contrast-enhanced neck CT as indicated. The above is in keeping with the ACR TI-RADS recommendations - J Am Coll Radiol 2017;14:587-595. Electronically Signed   By: Simonne Come M.D.   On: 08/29/2022 12:13

## 2022-09-26 NOTE — Assessment & Plan Note (Signed)
She will continue on her weight loss journey with medication assistance

## 2022-09-30 ENCOUNTER — Inpatient Hospital Stay (HOSPITAL_BASED_OUTPATIENT_CLINIC_OR_DEPARTMENT_OTHER): Payer: Medicaid Other | Admitting: Psychiatry

## 2022-09-30 ENCOUNTER — Other Ambulatory Visit: Payer: Self-pay

## 2022-09-30 ENCOUNTER — Encounter: Payer: Self-pay | Admitting: Oncology

## 2022-09-30 VITALS — BP 160/79 | HR 77 | Temp 98.6°F | Wt >= 6400 oz

## 2022-09-30 DIAGNOSIS — J4541 Moderate persistent asthma with (acute) exacerbation: Secondary | ICD-10-CM

## 2022-09-30 DIAGNOSIS — Z6841 Body Mass Index (BMI) 40.0 and over, adult: Secondary | ICD-10-CM | POA: Diagnosis not present

## 2022-09-30 DIAGNOSIS — C541 Malignant neoplasm of endometrium: Secondary | ICD-10-CM | POA: Diagnosis not present

## 2022-09-30 DIAGNOSIS — N8502 Endometrial intraepithelial neoplasia [EIN]: Secondary | ICD-10-CM

## 2022-09-30 NOTE — Patient Instructions (Signed)
It was a pleasure to see you in clinic today. - Return visit planned for beginning of July. - We will see what your scans show in June.  Thank you very much for allowing me to provide care for you today.  I appreciate your confidence in choosing our Gynecologic Oncology team at [facility].  If you have any questions about your visit today please call our office or send [place] a MyChart message and we will get back to you as soon as possible.

## 2022-09-30 NOTE — Progress Notes (Signed)
Leslye Peer the phone number for Dr. Lacie Draft office to schedule an appointment.

## 2022-09-30 NOTE — Progress Notes (Unsigned)
Gynecologic Oncology Return Clinic Visit  Date of Service: 09/30/2022 Referring Provider: Valentina Shaggy, MD   Assessment & Plan: Deborah Benson is a 62 y.o. 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  Endometrial cancer: - Pt not a surgical candidate due to obesity and pulmonary status. Pt was admitted to the hospital for 5 days after her D&C due to hypoxia. Was previously unable to lay flat which limited her option for radiation treatment. Pt reports that she is now able to lay flat, but still suboptimal control of lung disease. - Currently being treated with IUD and letrozole. - Attempt at EMBx today, aborted due to pt discomfort and unable to pass the pipelle past the internal cervical os.  - Pt has follow-up with Dr. Bertis Ruddy in June with plan for repeat imaging around that time. - Can reattempt EMB at next visit pending her repeat imaging in June.  - Germline genetic testing negative. - Plan return visit in 3 months.   Morbid obesity: - Reviewed importance for weight loss both for her overall health as well as potential for additional treatment in the future for her endometrial cancer. - Discussed options for referral to nutrition, medical weight management clinic. Pt would like to proceed. Referrals place.   Poorly controlled asthma: - Last saw Pulmonology 07/09/22. At that time, recommendation was for follow-up in 3 months. Does not appear to follow-up scheduled. - Recommended that pt call to set-up follow-up as continued optimization will help pt with potential future treatment options for her cancer.    RTC 3 months.  Clide Cliff, MD Gynecologic Oncology   Medical Decision Making I personally spent  TOTAL 30 minutes face-to-face and non-face-to-face in the care of this patient, which includes all pre, intra, and post visit time on the date of  service.  ----------------------- Reason for Visit: Surveillance  Treatment History: Oncology History Overview Note  MSI stable, ER 95%, PR 100% Genetics neg   Uterine cancer  04/17/2022 Imaging   US pelvis 1. Prominent abnormal endometrial thickening (41 mm). In the setting of post-menopausal bleeding, endometrial sampling is indicated to exclude carcinoma. If results are benign, sonohysterogram should be considered for focal lesion work-up.  2. Nonvisualization of the ovaries. No adnexal masses.   06/04/2022 Pathology Results   A. 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  See comment.    07/17/2022 Imaging   MRI pelvis 1. Maximal thickness of the endometrial canal approaches 2.5 cm, compared to gross thickening up to 4 cm on the previous exam. Presence is suspicious for residual tumor in the endometrium but shows improvement since previous imaging. Exam quality is compromised by patient's body habitus. Depth of myometrial invasion is difficult to assess on the current study but there is certainly no gross invasion beyond 50%. If additional MRI follow-up is desired direct involvement from a radiologist for protocol prescription may be helpful to mitigate artifacts and maximize image quality. 2. IUD in place 3. 11 mm RIGHT pelvic sidewall lymph node other smaller lymph nodes in the pelvis. This finding is nonspecific but suspicious in light of gynecologic neoplasm. PET imaging could be considered for further evaluation.   07/22/2022 Initial Diagnosis   Uterine cancer (HCC)   08/21/2022 Imaging   Lack of intravenous contrast administration and photon attenuation related to patient habitus limit evaluation of the  thoracic inlet and pelvis. Within this context:   1. IUD in place in the uterus. Pelvic structures not well evaluated on this  examination. 2. Pelvic sidewall lymph node seen on prior MRI is not identified on this examination, likely secondary to artifact from photon attenuation. 3. Enlarged periportal lymph nodes measure up to 16 mm in short axis, nonspecific and commonly reactive. However metastatic nodal disease while not favored is not excluded. Consider attention on follow-up imaging. 4. Lobular enlargement of the thyroid gland measuring up to 2.7 cm, suggest further evaluation with dedicated thyroid ultrasound. 5. Soft tissue nodule anterior to the great vessels and subjacent to the thyroid gland measuring 2.7 x 1.8 cm, is nonspecific possibly reflecting an enlarged lymph node or parathyroid adenoma. Consider further evaluation with contrast enhanced CT of the neck. 6. Stranding in the anterior abdominal wall, nonspecific but possibly reflecting cellulitis. 7.  Aortic Atherosclerosis (ICD10-I70.0).   08/21/2022 Cancer Staging   Staging form: Corpus Uteri - Carcinoma and Carcinosarcoma, AJCC 8th Edition - Clinical stage from 08/21/2022: Stage IIIC2 (cT1, cN2a, cM0) - Signed by Artis Delay, MD on 08/21/2022 Stage prefix: Initial diagnosis    Genetic Testing   [facility] CancerNext-Expanded Panel+RNA was Negative. Report date is 08/23/2022.  The CancerNext-Expanded gene panel offered by [facility] and includes sequencing, rearrangement, and RNA analysis for the following 71 genes: AIP, ALK, APC, ATM, AXIN2, BAP1, BARD1, BMPR1A, BRCA1, BRCA2, BRIP1, CDC73, CDH1, CDK4, CDKN1B, CDKN2A, CHEK2, CTNNA1, DICER1, FH, FLCN, KIF1B, LZTR1, MAX, MEN1, MET, MLH1, MSH2, MSH3, MSH6, MUTYH, NF1, NF2, NTHL1, PALB2, PHOX2B, PMS2, POT1, PRKAR1A, PTCH1, PTEN, RAD51C, RAD51D, RB1, RET, SDHA, SDHAF2, SDHB, SDHC, SDHD, SMAD4, SMARCA4, SMARCB1, SMARCE1, STK11, SUFU, TMEM127, TP53, TSC1, TSC2, and VHL (sequencing and deletion/duplication); EGFR, EGLN1, HOXB13, KIT, MITF, PDGFRA, POLD1, and POLE (sequencing only); EPCAM and GREM1 (deletion/duplication  only).    08/23/2022 -  Anti-estrogen oral therapy   She is started on Femara   08/29/2022 Imaging   1. Thyromegaly with findings suggestive of multinodular goiter. 2. Nodules labeled #1 and #4 both meet imaging criteria to recommend percutaneous sampling as indicated. 3. Nodule #3 meets imaging criteria to recommend a 1 year follow-up.  4. Note, the substernal nodule demonstrated on recent chest CT was not visualized sonographically due to substernal location. Further evaluation could be performed with nuclear medicine thyroid uptake and scan (to evaluate if this substernal tissue represents exophytic or ectopic thyroid parenchyma) versus contrast-enhanced neck CT as indicated.     Interval History: Pt presents today for endometrial cancer surveillance. She reports that her lungs are still "not great." She was last seen by Pulm in January. She isn't sure about follow-up. She also reports that she plans to see Dr. Shawnee Knapp with endocrinology for her thyroid.  She denies vaginal bleeding. She reports occasional pelvic cramping that is not everyday, only once in a while. When it occurs it only last for a few seconds and manageable. She reports that her appetite is decreased but she thinks this is due to the trulicity. She has been experiencing some hot flashes and decreased energy on femara.    Past Medical/Surgical History: Past Medical History:  Diagnosis Date   Arthritis    Asthma    Cancer    ovarian cancer   Depression    Diabetes mellitus without complication    oral meds only   Gout    HTN (hypertension)    "borderline" med was stopped by pt- MD aware.   Neuromuscular disorder  neuropathy -hands/ feet   Neuropathy    Obesity    BMI >76.6   Pneumonia     Past Surgical History:  Procedure Laterality Date   FOOT SURGERY     due to needle break in foot   HYSTEROSCOPY WITH D & C N/A 06/04/2022   Procedure: DILATATION AND CURETTAGE;  Surgeon: Clide Cliff, MD;  Location:  [facility];  Service: Gynecology;  Laterality: N/A;   INTRAUTERINE DEVICE (IUD) INSERTION N/A 06/04/2022   Procedure: MIRENA INTRAUTERINE DEVICE (IUD) INSERTION;  Surgeon: Clide Cliff, MD;  Location: [facility];  Service: Gynecology;  Laterality: N/A;   MULTIPLE TOOTH EXTRACTIONS     TONSILLECTOMY      Family History  Problem Relation Age of Onset   Heart attack Mother    Stroke Mother    Gout Mother    Hypertension Mother    Lung cancer Father 44 - 51       smoked   Glaucoma Father    Heart disease Sister        fluid    Other Sister        spinal disease   Throat cancer Sister    Diabetes Brother    Prostate cancer Maternal Uncle    Ovarian cancer Maternal Grandmother    Dementia Maternal Grandfather    Glaucoma Other        nephew   Seizures Other        niece   Colon cancer Neg Hx    Breast cancer Neg Hx    Endometrial cancer Neg Hx    Pancreatic cancer Neg Hx     Social History   Socioeconomic History   Marital status: Single    Spouse name: Not on file   Number of children: 0   Years of education: Not on file   Highest education level: Not on file  Occupational History   Occupation: disabled  Tobacco Use   Smoking status: Every Day    Packs/day: .4    Types: Cigarettes    Start date: 06/10/1978   Smokeless tobacco: Never   Tobacco comments:    down to 5 per day - Previous 7-10  Vaping Use   Vaping Use: Never used  Substance and Sexual Activity   Alcohol use: No    Alcohol/week: 0.0 standard drinks of alcohol   Drug use: No   Sexual activity: Not Currently  Other Topics Concern   Not on file  Social History Narrative         Past Medical History:      Irregular menstrual periods      Asthma         Social History:      Disabled?,  Weight and OA; Moved to [place] 6 yrs ago.  Single         Social Determinants of Health   Financial Resource Strain: Not on file  Food Insecurity: No Food Insecurity (08/28/2022)   Hunger Vital Sign    Worried  About Running Out of Food in the Last Year: Never true    Ran Out of Food in the Last Year: Never true  Transportation Needs: No Transportation Needs (08/28/2022)   PRAPARE - Administrator, Civil Service (Medical): No    Lack of Transportation (Non-Medical): No  Physical Activity: Not on file  Stress: Not on file  Social Connections: Not on file    Current Medications:  Current Outpatient Medications:    albuterol (PROVENTIL) (  2.5 MG/3ML) 0.083% nebulizer solution, Take 3 mLs (2.5 mg total) by nebulization every 6 (six) hours as needed for wheezing or shortness of breath., Disp: 150 mL, Rfl: 1   cholecalciferol (VITAMIN D3) 25 MCG (1000 UNIT) tablet, Take 1,000 Units by mouth daily., Disp: , Rfl:    gabapentin (NEURONTIN) 100 MG capsule, Take 1 capsule (100 mg total) by mouth 3 (three) times daily. (Patient taking differently: Take 100 mg by mouth daily.), Disp: 90 capsule, Rfl: 0   letrozole (FEMARA) 2.5 MG tablet, Take 1 tablet (2.5 mg total) by mouth daily., Disp: 30 tablet, Rfl: 1   LORazepam (ATIVAN) 0.5 MG tablet, Take 1 tablet (0.5 mg total) by mouth every 8 (eight) hours., Disp: 30 tablet, Rfl: 0   metFORMIN (GLUCOPHAGE-XR) 500 MG 24 hr tablet, TAKE 1 TABLET(500 MG) BY MOUTH IN THE MORNING AND AT BEDTIME, Disp: 60 tablet, Rfl: 0   nystatin (MYCOSTATIN/NYSTOP) powder, Apply 1 Application topically 3 (three) times daily as needed. (Patient taking differently: Apply 1 Application topically daily.), Disp: 60 g, Rfl: 5   PROAIR HFA 108 (90 Base) MCG/ACT inhaler, INHALE 2 PUFFS BY MOUTH EVERY 6 HOURS AS NEEDED FOR SHORTNESS OF BREATH. PLEASE MAKE AN APPOINTMENT, Disp: 17 g, Rfl: 0   TRULICITY 1.5 MG/0.5ML SOPN, ADMINISTER 1.5 MG UNDER THE SKIN 1 TIME A WEEK, Disp: 2 mL, Rfl: 0  Review of Symptoms: Complete 10-system review is positive for: Shortness of breath, urinary frequency, wheezing, numbness, abdominal pain, depression, hot flashes  Physical Exam: BP (!) 160/79 Comment:  recheck, Dr.Kindell Strada aware  Pulse 77   Temp 98.6 F (37 C) (Oral)   Wt (!) 416 lb (188.7 kg)   SpO2 96%   BMI 71.41 kg/m  General: Alert, oriented, no acute distress. HEENT: Normocephalic, atraumatic. Sclera anicteric.  Chest: Normal work of breathing. Rhonchi in LLL. Cardiovascular: Regular rate and rhythm, no murmurs. Abdomen: Soft, nontender.  Normoactive bowel sounds.  No masses appreciated.   Extremities: Grossly normal range of motion.  Warm, well perfused.  No edema bilaterally. Skin: No rashes or lesions noted. Lymphatics: No cervical, supraclavicular, or inguinal adenopathy. GU: Normal appearing external genitalia without erythema, excoriation, or lesions.  Speculum exam reveals normal cervix with IUD strings visualized. Endometrial biopsy attempted but unable to pass the internal cervical os; attempt discontinued due to patient discomfort. Bimanual exam reveals normal cervix. Uterus and adnexa unable to be palpated due to body habitus.  Rectovaginal exam negative but limited by body habitus. Exam chaperoned by Kimberly [place], CMA   Laboratory & Radiologic Studies: none

## 2022-10-02 ENCOUNTER — Encounter: Payer: Self-pay | Admitting: Psychiatry

## 2022-10-03 ENCOUNTER — Other Ambulatory Visit: Payer: Self-pay | Admitting: Family Medicine

## 2022-10-03 DIAGNOSIS — E119 Type 2 diabetes mellitus without complications: Secondary | ICD-10-CM

## 2022-10-09 ENCOUNTER — Ambulatory Visit: Payer: Medicaid Other | Admitting: Family Medicine

## 2022-10-09 ENCOUNTER — Other Ambulatory Visit: Payer: Self-pay

## 2022-10-09 ENCOUNTER — Encounter: Payer: Self-pay | Admitting: Family Medicine

## 2022-10-09 VITALS — BP 161/89 | HR 81 | Ht 64.0 in | Wt >= 6400 oz

## 2022-10-09 DIAGNOSIS — J441 Chronic obstructive pulmonary disease with (acute) exacerbation: Secondary | ICD-10-CM

## 2022-10-09 DIAGNOSIS — I10 Essential (primary) hypertension: Secondary | ICD-10-CM | POA: Diagnosis not present

## 2022-10-09 DIAGNOSIS — E119 Type 2 diabetes mellitus without complications: Secondary | ICD-10-CM

## 2022-10-09 DIAGNOSIS — D649 Anemia, unspecified: Secondary | ICD-10-CM

## 2022-10-09 LAB — POCT GLYCOSYLATED HEMOGLOBIN (HGB A1C): Hemoglobin A1C: 8.7 % — AB (ref 4.0–5.6)

## 2022-10-09 MED ORDER — PREDNISONE 20 MG PO TABS: 40.0000 mg | ORAL_TABLET | Freq: Every day | ORAL | 0 refills | Status: DC

## 2022-10-09 MED ORDER — TRULICITY 1.5 MG/0.5ML ~~LOC~~ SOAJ: SUBCUTANEOUS | 0 refills | Status: DC

## 2022-10-09 MED ORDER — TRULICITY 3 MG/0.5ML ~~LOC~~ SOAJ: 3.0000 mg | SUBCUTANEOUS | 1 refills | Status: DC

## 2022-10-09 NOTE — Progress Notes (Signed)
SUBJECTIVE:   CHIEF COMPLAINT / HPI:   DIABETES Stopped metformin felt it interfered with her vision Out of trulicity for several weeks due to supply problems Urinating frequently  ENDOMETRIAL CANCER Under treatment.  BREATHING COPD? Saw Dr Sherri Rad in Jan 2024 Only using albuterol.  For last weeks or so having cough and shortness of breath with using albuterol every 4 hours.  No fever or chest pain   Tobacco Down to 4 cigs per day.     OBJECTIVE:   BP (!) 161/89   Pulse 81   Ht 5\' 4"  (1.626 m)   Wt (!) 409 lb (185.5 kg)   SpO2 96%   BMI 70.20 kg/m   No distress Lungs - distant breath sounds with mild diffuse exp wheeze Heart - Regular rate and rhythm.  No murmurs, gallops or rubs.      ASSESSMENT/PLAN:   COPD with acute exacerbation [facility]) Assessment & Plan: Seems to have another exacerbation probably due to recent virus.  Unsure of underlying diagnosis.  Recommend she follow up with her pulmonologist.  Likely need controller medication   Orders: -     predniSONE; Take 2 tablets (40 mg total) by mouth daily with breakfast.  Dispense: 10 tablet; Refill: 0  Type 2 diabetes mellitus without complication, without long-term current use of insulin (HCC) Assessment & Plan: Not at goal.  Likely due to stopping metformin (she does not want to reconsider) and being out of Trulicity.  Hopefully can restart this and increase dose to achieve better control.    Orders: -     CMP14+EGFR -     POCT glycosylated hemoglobin (Hb A1C) -     Trulicity; ADMINISTER 1.5 MG UNDER THE SKIN 1 TIME A WEEK  Dispense: 2 mL; Refill: 0 -     Trulicity; Inject 3 mg as directed once a week.  Dispense: 2 mL; Refill: 1  Primary hypertension Assessment & Plan: BP Readings from Last 3 Encounters:  10/09/22 (!) 161/89  09/30/22 (!) 160/79  09/26/22 139/75     Not at goal.  Maybe due to copd exacerbation.  Will monitor.  May need ARB if persists   Orders: -     CBC with  Differential/Platelet  Anemia, unspecified type Assessment & Plan: As best can tell developed during chemotherapy for endometrial cancer.  Will check cbc and  labs.   Orders: -     Vitamin B12 -     Ferritin     Patient Instructions  Good to see you today - Thank you for coming in  Things we discussed today:  Lungs - make an appointment with your pulmonologist.  Let him know I prescribed prednisone and if it helped.   Ask if you need another inhaler.  Get the sleep study Let me know if the lungs are not better in 2 weeks  Diabetes - increase trulicity up to 2.5 mg weekly Let me know if any problems taking it Check other pharmacies  I will call you if your tests are not good.  Otherwise, I will send you a message on MyChart (if it is active) or a letter in the mail..  If you do not hear from me with in 2 weeks please call our office.     Congrats on the smoking - keep going  We will talk about colonoscopy next visit   Please always bring your medication bottles  Come back to see me in 3 months    Gaynell Face  Pauline Good, MD [facility]

## 2022-10-09 NOTE — Assessment & Plan Note (Signed)
Not at goal.  Likely due to stopping metformin (she does not want to reconsider) and being out of Trulicity.  Hopefully can restart this and increase dose to achieve better control.

## 2022-10-09 NOTE — Assessment & Plan Note (Signed)
BP Readings from Last 3 Encounters:  10/09/22 (!) 161/89  09/30/22 (!) 160/79  09/26/22 139/75     Not at goal.  Maybe due to copd exacerbation.  Will monitor.  May need ARB if persists

## 2022-10-09 NOTE — Assessment & Plan Note (Signed)
Seems to have another exacerbation probably due to recent virus.  Unsure of underlying diagnosis.  Recommend she follow up with her pulmonologist.  Likely need controller medication

## 2022-10-09 NOTE — Assessment & Plan Note (Signed)
As best can tell developed during chemotherapy for endometrial cancer.  Will check cbc and  labs.

## 2022-10-09 NOTE — Patient Instructions (Addendum)
Good to see you today - Thank you for coming in  Things we discussed today:  Lungs - make an appointment with your pulmonologist.  Let him know I prescribed prednisone and if it helped.   Ask if you need another inhaler.  Get the sleep study Let me know if the lungs are not better in 2 weeks  Diabetes - increase trulicity up to 2.5 mg weekly Let me know if any problems taking it Check other pharmacies  I will call you if your tests are not good.  Otherwise, I will send you a message on MyChart (if it is active) or a letter in the mail..  If you do not hear from me with in 2 weeks please call our office.     Congrats on the smoking - keep going  We will talk about colonoscopy next visit   Please always bring your medication bottles  Come back to see me in 3 months

## 2022-10-10 LAB — CBC WITH DIFFERENTIAL/PLATELET
Basophils Absolute: 0 10*3/uL (ref 0.0–0.2)
Basos: 1 %
EOS (ABSOLUTE): 0.1 10*3/uL (ref 0.0–0.4)
Eos: 2 %
Hematocrit: 40.6 % (ref 34.0–46.6)
Hemoglobin: 11.4 g/dL (ref 11.1–15.9)
Immature Grans (Abs): 0 10*3/uL (ref 0.0–0.1)
Immature Granulocytes: 0 %
Lymphocytes Absolute: 2.1 10*3/uL (ref 0.7–3.1)
Lymphs: 29 %
MCH: 22.2 pg — ABNORMAL LOW (ref 26.6–33.0)
MCHC: 28.1 g/dL — ABNORMAL LOW (ref 31.5–35.7)
MCV: 79 fL (ref 79–97)
Monocytes Absolute: 0.5 10*3/uL (ref 0.1–0.9)
Monocytes: 7 %
Neutrophils Absolute: 4.5 10*3/uL (ref 1.4–7.0)
Neutrophils: 61 %
Platelets: 323 10*3/uL (ref 150–450)
RBC: 5.13 x10E6/uL (ref 3.77–5.28)
RDW: 17.2 % — ABNORMAL HIGH (ref 11.7–15.4)
WBC: 7.3 10*3/uL (ref 3.4–10.8)

## 2022-10-10 LAB — CMP14+EGFR
ALT: 9 IU/L (ref 0–32)
AST: 11 IU/L (ref 0–40)
Albumin/Globulin Ratio: 1 — ABNORMAL LOW (ref 1.2–2.2)
Albumin: 4 g/dL (ref 3.8–4.9)
Alkaline Phosphatase: 116 IU/L (ref 44–121)
BUN/Creatinine Ratio: 9 — ABNORMAL LOW (ref 12–28)
BUN: 7 mg/dL — ABNORMAL LOW (ref 8–27)
Bilirubin Total: 0.3 mg/dL (ref 0.0–1.2)
CO2: 28 mmol/L (ref 20–29)
Calcium: 9.5 mg/dL (ref 8.7–10.3)
Chloride: 98 mmol/L (ref 96–106)
Creatinine, Ser: 0.8 mg/dL (ref 0.57–1.00)
Globulin, Total: 4.1 g/dL (ref 1.5–4.5)
Glucose: 182 mg/dL — ABNORMAL HIGH (ref 70–99)
Potassium: 4.4 mmol/L (ref 3.5–5.2)
Sodium: 141 mmol/L (ref 134–144)
Total Protein: 8.1 g/dL (ref 6.0–8.5)
eGFR: 84 mL/min/{1.73_m2} (ref >59)

## 2022-10-10 LAB — VITAMIN B12: Vitamin B-12: 412 pg/mL (ref 232–1245)

## 2022-10-10 LAB — FERRITIN: Ferritin: 33 ng/mL (ref 15–150)

## 2022-10-15 ENCOUNTER — Encounter: Payer: Self-pay | Admitting: Family Medicine

## 2022-10-20 ENCOUNTER — Other Ambulatory Visit: Payer: Self-pay | Admitting: Hematology and Oncology

## 2022-10-22 ENCOUNTER — Telehealth: Payer: Self-pay | Admitting: Pulmonary Disease

## 2022-10-22 ENCOUNTER — Telehealth: Payer: Self-pay | Admitting: Oncology

## 2022-10-22 NOTE — Telephone Encounter (Signed)
Attempted to call Deborah Benson regarding the endocrine referral and her phone is not connected.  Left a message for her brother to have her call back.

## 2022-10-25 ENCOUNTER — Telehealth: Payer: Self-pay

## 2022-10-25 NOTE — Telephone Encounter (Signed)
Deborah Benson with [employer] calls nurse line in regards to incontinence supplies.  Deborah Benson reports she will be faxing over the paperwork today.   Will forward to PCP.

## 2022-10-28 NOTE — Telephone Encounter (Signed)
Called Deborah Benson regarding the endocrine referral.  Gave her the number to call to schedule an appointment with Dr. Shawnee Knapp with endocrinology at [facility] to discuss her thyroid.  Advised her that her phone was not accepting calls and [facility] was not able to get a hold of her.  Deborah Benson verbalized agreement and stated that she would call Dr. Lacie Draft office to schedule an appointment.

## 2022-10-30 NOTE — Telephone Encounter (Signed)
Signed paperwork Terisa Starr, MD  Family Medicine Teaching Service

## 2022-11-22 ENCOUNTER — Ambulatory Visit (HOSPITAL_COMMUNITY)
Admission: RE | Admit: 2022-11-22 | Discharge: 2022-11-22 | Disposition: A | Discharge status: Home / Self Care | Payer: Medicaid Other | Source: Ambulatory Visit | Attending: Hematology and Oncology | Admitting: Hematology and Oncology

## 2022-11-22 ENCOUNTER — Encounter (HOSPITAL_COMMUNITY): Payer: Self-pay

## 2022-11-22 ENCOUNTER — Other Ambulatory Visit: Payer: Self-pay

## 2022-11-22 ENCOUNTER — Inpatient Hospital Stay: Payer: Medicaid Other | Attending: Psychiatry

## 2022-11-22 DIAGNOSIS — C549 Malignant neoplasm of corpus uteri, unspecified: Secondary | ICD-10-CM

## 2022-11-22 DIAGNOSIS — E041 Nontoxic single thyroid nodule: Secondary | ICD-10-CM

## 2022-11-22 DIAGNOSIS — C541 Malignant neoplasm of endometrium: Secondary | ICD-10-CM | POA: Insufficient documentation

## 2022-11-22 LAB — CMP (CANCER CENTER ONLY)
ALT: 8 U/L (ref 0–44)
AST: 12 U/L — ABNORMAL LOW (ref 15–41)
Albumin: 3.4 g/dL — ABNORMAL LOW (ref 3.5–5.0)
Alkaline Phosphatase: 101 U/L (ref 38–126)
Anion gap: 6 (ref 5–15)
BUN: 8 mg/dL (ref 6–20)
CO2: 30 mmol/L (ref 22–32)
Calcium: 9.3 mg/dL (ref 8.9–10.3)
Chloride: 104 mmol/L (ref 98–111)
Creatinine: 0.83 mg/dL (ref 0.44–1.00)
GFR, Estimated: >60 mL/min (ref >60)
Glucose, Bld: 253 mg/dL — ABNORMAL HIGH (ref 70–99)
Potassium: 4.2 mmol/L (ref 3.5–5.1)
Sodium: 140 mmol/L (ref 135–145)
Total Bilirubin: 0.3 mg/dL (ref 0.3–1.2)
Total Protein: 7.6 g/dL (ref 6.5–8.1)

## 2022-11-22 LAB — CBC WITH DIFFERENTIAL (CANCER CENTER ONLY)
Abs Immature Granulocytes: 0.02 10*3/uL (ref 0.00–0.07)
Basophils Absolute: 0 10*3/uL (ref 0.0–0.1)
Basophils Relative: 1 %
Eosinophils Absolute: 0.1 10*3/uL (ref 0.0–0.5)
Eosinophils Relative: 2 %
HCT: 41 % (ref 36.0–46.0)
Hemoglobin: 12.1 g/dL (ref 12.0–15.0)
Immature Granulocytes: 0 %
Lymphocytes Relative: 30 %
Lymphs Abs: 2.2 10*3/uL (ref 0.7–4.0)
MCH: 24.2 pg — ABNORMAL LOW (ref 26.0–34.0)
MCHC: 29.5 g/dL — ABNORMAL LOW (ref 30.0–36.0)
MCV: 82 fL (ref 80.0–100.0)
Monocytes Absolute: 0.4 10*3/uL (ref 0.1–1.0)
Monocytes Relative: 5 %
Neutro Abs: 4.5 10*3/uL (ref 1.7–7.7)
Neutrophils Relative %: 62 %
Platelet Count: 296 10*3/uL (ref 150–400)
RBC: 5 MIL/uL (ref 3.87–5.11)
RDW: 19 % — ABNORMAL HIGH (ref 11.5–15.5)
WBC Count: 7.3 10*3/uL (ref 4.0–10.5)
nRBC: 0 % (ref 0.0–0.2)

## 2022-11-22 LAB — IRON AND IRON BINDING CAPACITY (CC-WL,HP ONLY)
Iron: 54 ug/dL (ref 28–170)
Saturation Ratios: 16 % (ref 10.4–31.8)
TIBC: 344 ug/dL (ref 250–450)
UIBC: 290 ug/dL (ref 148–442)

## 2022-11-22 MED ORDER — IOHEXOL 9 MG/ML PO SOLN
1000.0000 mL | Freq: Once | ORAL | Status: AC
  Administered 2022-11-22: 1000 mL via ORAL

## 2022-11-22 MED ORDER — SODIUM CHLORIDE (PF) 0.9 % IJ SOLN
INTRAMUSCULAR | Status: AC
  Filled 2022-11-22: qty 50

## 2022-11-22 MED ORDER — IOHEXOL 300 MG/ML  SOLN
100.0000 mL | Freq: Once | INTRAMUSCULAR | Status: AC | PRN
  Administered 2022-11-22: 100 mL via INTRAVENOUS

## 2022-11-24 LAB — CA 125: Cancer Antigen (CA) 125: 9.2 U/mL (ref 0.0–38.1)

## 2022-11-25 NOTE — Assessment & Plan Note (Deleted)
I have reviewed imaging studies with the patient We discussed limitation of quality of imaging due to body habitus Overall, we have stable disease control Unfortunately, she is not able to make progress with weight loss She will continue current treatment I plan to repeat imaging study again in 3 to 4 months time

## 2022-11-25 NOTE — Assessment & Plan Note (Deleted)
She will continue on her weight loss journey with medication assistance 

## 2022-11-25 NOTE — Progress Notes (Deleted)
[facility] OFFICE PROGRESS NOTE  Patient Care Team: Carney Living, MD as PCP - General  ASSESSMENT & PLAN:  Uterine cancer [facility]) I have reviewed imaging studies with the patient We discussed limitation of quality of imaging due to body habitus Overall, we have stable disease control Unfortunately, she is not able to make progress with weight loss She will continue current treatment I plan to repeat imaging study again in 3 to 4 months time  BMI 70 and over, adult [facility]) She will continue on her weight loss journey with medication assistance  No orders of the defined types were placed in this encounter.   All questions were answered. The patient knows to call the clinic with any problems, questions or concerns. The total time spent in the appointment was {CHL ONC TIME VISIT - [id number]} encounter with patients including review of chart and various tests results, discussions about plan of care and coordination of care plan   Artis Delay, MD 11/25/2022 12:57 PM  INTERVAL HISTORY: Please see below for problem oriented charting. she returns for treatment follow-up and review of imaging study results  REVIEW OF SYSTEMS:   Constitutional: Denies fevers, chills or abnormal weight loss Eyes: Denies blurriness of vision Ears, nose, mouth, throat, and face: Denies mucositis or sore throat Respiratory: Denies cough, dyspnea or wheezes Cardiovascular: Denies palpitation, chest discomfort or lower extremity swelling Gastrointestinal:  Denies nausea, heartburn or change in bowel habits Skin: Denies abnormal skin rashes Lymphatics: Denies new lymphadenopathy or easy bruising Neurological:Denies numbness, tingling or new weaknesses Behavioral/Psych: Mood is stable, no new changes  All other systems were reviewed with the patient and are negative.  I have reviewed the past medical history, past surgical history, social history and family history with the patient  and they are unchanged from previous note.  ALLERGIES:  is allergic to penicillins.  MEDICATIONS:  Current Outpatient Medications  Medication Sig Dispense Refill   albuterol (PROVENTIL) (2.5 MG/3ML) 0.083% nebulizer solution Take 3 mLs (2.5 mg total) by nebulization every 6 (six) hours as needed for wheezing or shortness of breath. 150 mL 1   cholecalciferol (VITAMIN D3) 25 MCG (1000 UNIT) tablet Take 1,000 Units by mouth daily.     Dulaglutide (TRULICITY) 1.5 MG/0.5ML SOPN ADMINISTER 1.5 MG UNDER THE SKIN 1 TIME A WEEK 2 mL 0   Dulaglutide (TRULICITY) 3 MG/0.5ML SOPN Inject 3 mg as directed once a week. 2 mL 1   gabapentin (NEURONTIN) 100 MG capsule Take 1 capsule (100 mg total) by mouth 3 (three) times daily. (Patient taking differently: Take 100 mg by mouth daily.) 90 capsule 0   letrozole (FEMARA) 2.5 MG tablet TAKE 1 TABLET(2.5 MG) BY MOUTH DAILY 90 tablet 0   LORazepam (ATIVAN) 0.5 MG tablet Take 1 tablet (0.5 mg total) by mouth every 8 (eight) hours. 30 tablet 0   metFORMIN (GLUCOPHAGE-XR) 500 MG 24 hr tablet TAKE 1 TABLET(500 MG) BY MOUTH IN THE MORNING AND AT BEDTIME (Patient not taking: Reported on 10/09/2022) 60 tablet 0   nystatin (MYCOSTATIN/NYSTOP) powder Apply 1 Application topically 3 (three) times daily as needed. (Patient taking differently: Apply 1 Application topically daily.) 60 g 5   predniSONE (DELTASONE) 20 MG tablet Take 2 tablets (40 mg total) by mouth daily with breakfast. 10 tablet 0   PROAIR HFA 108 (90 Base) MCG/ACT inhaler INHALE 2 PUFFS BY MOUTH EVERY 6 HOURS AS NEEDED FOR SHORTNESS OF BREATH. PLEASE MAKE AN APPOINTMENT 17 g 0   No current  facility-administered medications for this visit.    SUMMARY OF ONCOLOGIC HISTORY: Oncology History Overview Note  MSI stable, ER 95%, PR 100% Genetics neg   Uterine cancer (HCC)  04/17/2022 Imaging   US pelvis 1. Prominent abnormal endometrial thickening (41 mm). In the setting of post-menopausal bleeding, endometrial  sampling is indicated to exclude carcinoma. If results are benign, sonohysterogram should be considered for focal lesion work-up.  2. Nonvisualization of the ovaries. No adnexal masses.   06/04/2022 Pathology Results   A. 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  See comment.    07/17/2022 Imaging   MRI pelvis 1. Maximal thickness of the endometrial canal approaches 2.5 cm, compared to gross thickening up to 4 cm on the previous exam. Presence is suspicious for residual tumor in the endometrium but shows improvement since previous imaging. Exam quality is compromised by patient's body habitus. Depth of myometrial invasion is difficult to assess on the current study but there is certainly no gross invasion beyond 50%. If additional MRI follow-up is desired direct involvement from a radiologist for protocol prescription may be helpful to mitigate artifacts and maximize image quality. 2. IUD in place 3. 11 mm RIGHT pelvic sidewall lymph node other smaller lymph nodes in the pelvis. This finding is nonspecific but suspicious in light of gynecologic neoplasm. PET imaging could be considered for further evaluation.   07/22/2022 Initial Diagnosis   Uterine cancer (HCC)   08/21/2022 Imaging   Lack of intravenous contrast administration and photon attenuation related to patient habitus limit evaluation of the thoracic inlet and pelvis. Within this context:   1. IUD in place in the uterus. Pelvic structures not well evaluated on this examination. 2. Pelvic sidewall lymph node seen on prior MRI is not identified on this examination, likely secondary to artifact from photon attenuation. 3. Enlarged periportal lymph nodes measure up to 16 mm in short axis, nonspecific and commonly reactive. However metastatic nodal disease while not favored is not excluded. Consider  attention on follow-up imaging. 4. Lobular enlargement of the thyroid gland measuring up to 2.7 cm, suggest further evaluation with dedicated thyroid ultrasound. 5. Soft tissue nodule anterior to the great vessels and subjacent to the thyroid gland measuring 2.7 x 1.8 cm, is nonspecific possibly reflecting an enlarged lymph node or parathyroid adenoma. Consider further evaluation with contrast enhanced CT of the neck. 6. Stranding in the anterior abdominal wall, nonspecific but possibly reflecting cellulitis. 7.  Aortic Atherosclerosis (ICD10-I70.0).   08/21/2022 Cancer Staging   Staging form: Corpus Uteri - Carcinoma and Carcinosarcoma, AJCC 8th Edition - Clinical stage from 08/21/2022: Stage IIIC2 (cT1, cN2a, cM0) - Signed by Artis Delay, MD on 08/21/2022 Stage prefix: Initial diagnosis    Genetic Testing   [facility] CancerNext-Expanded Panel+RNA was Negative. Report date is 08/23/2022.  The CancerNext-Expanded gene panel offered by [facility] and includes sequencing, rearrangement, and RNA analysis for the following 71 genes: AIP, ALK, APC, ATM, AXIN2, BAP1, BARD1, BMPR1A, BRCA1, BRCA2, BRIP1, CDC73, CDH1, CDK4, CDKN1B, CDKN2A, CHEK2, CTNNA1, DICER1, FH, FLCN, KIF1B, LZTR1, MAX, MEN1, MET, MLH1, MSH2, MSH3, MSH6, MUTYH, NF1, NF2, NTHL1, PALB2, PHOX2B, PMS2, POT1, PRKAR1A, PTCH1, PTEN, RAD51C, RAD51D, RB1, RET, SDHA, SDHAF2, SDHB, SDHC, SDHD, SMAD4, SMARCA4, SMARCB1, SMARCE1, STK11, SUFU, TMEM127, TP53, TSC1, TSC2, and VHL (sequencing and deletion/duplication); EGFR, EGLN1, HOXB13, KIT, MITF, PDGFRA, POLD1, and POLE (sequencing only); EPCAM and GREM1 (deletion/duplication only).    08/23/2022 -  Anti-estrogen oral  therapy   She is started on Femara   08/29/2022 Imaging   1. Thyromegaly with findings suggestive of multinodular goiter. 2. Nodules labeled #1 and #4 both meet imaging criteria to recommend percutaneous sampling as indicated. 3. Nodule #3 meets imaging criteria to recommend a 1 year  follow-up.  4. Note, the substernal nodule demonstrated on recent chest CT was not visualized sonographically due to substernal location. Further evaluation could be performed with nuclear medicine thyroid uptake and scan (to evaluate if this substernal tissue represents exophytic or ectopic thyroid parenchyma) versus contrast-enhanced neck CT as indicated.   11/25/2022 Tumor Marker   Patient's tumor was tested for the following markers: CA-125. Results of the tumor marker test revealed 9.2.   11/25/2022 Imaging   CT CHEST ABDOMEN PELVIS W CONTRAST  Result Date: 11/22/2022 CLINICAL DATA:  Cervical cancer, assess treatment response, ongoing oral chemotherapy * Tracking Code: BO * EXAM: CT CHEST, ABDOMEN, AND PELVIS WITH CONTRAST TECHNIQUE: Multidetector CT imaging of the chest, abdomen and pelvis was performed following the standard protocol during bolus administration of intravenous contrast. RADIATION DOSE REDUCTION: This exam was performed according to the departmental dose-optimization program which includes automated exposure control, adjustment of the mA and/or kV according to patient size and/or use of iterative reconstruction technique. CONTRAST:  OMNIPAQUE IOHEXOL 300 MG/ML SOLN additional oral enteric contrast COMPARISON:  CT chest abdomen pelvis, 08/19/2022, thyroid ultrasound, 08/29/2022 FINDINGS: Examination is significantly limited by patient body habitus and related photopenia. CT CHEST FINDINGS Cardiovascular: No significant vascular findings. Mild cardiomegaly. No pericardial effusion. Mediastinum/Nodes: Unchanged anterior mediastinal nodule just below the thoracic inlet measuring 2.9 x 1.5 cm (series 2, image 16). No enlarged mediastinal, hilar, or axillary lymph nodes. Heterogeneously enlarged thyroid gland, previously assessed by dedicated thyroid ultrasound. This has been evaluated on previous imaging. (ref: J Am Coll Radiol. 2015 Feb;12(2): 143-50). Trachea, and esophagus  demonstrate no significant findings. Lungs/Pleura: Unchanged bandlike scarring or atelectasis throughout the lungs. No pleural effusion or pneumothorax. Musculoskeletal: No chest wall abnormality. No acute osseous findings. CT ABDOMEN PELVIS FINDINGS Hepatobiliary: Hepatomegaly, maximum coronal span 20.3 cm. No solid liver abnormality is seen. No gallstones, gallbladder wall thickening, or biliary dilatation. Pancreas: Unremarkable. No pancreatic ductal dilatation or surrounding inflammatory changes. Spleen: Normal in size without significant abnormality. Adrenals/Urinary Tract: Adrenal glands are unremarkable. Kidneys are normal, without renal calculi, solid lesion, or hydronephrosis. Bladder is unremarkable. Stomach/Bowel: Stomach is within normal limits. Appendix appears normal. No evidence of bowel wall thickening, distention, or inflammatory changes. Vascular/Lymphatic: Aortic atherosclerosis. Unchanged enlarged portacaval lymph node measuring up to 2.8 x 1.6 cm (series 2, image 67). Unchanged prominent inguinal lymph nodes (series 2, image 115). Reproductive: IUD present in the fundal endometrial cavity (series 2, image 106). Evaluation of the pelvic structures very limited by body habitus and photopenia; no obvious cervical mass appreciated. Other: No abdominal wall hernia or abnormality. No ascites. Musculoskeletal: No acute osseous findings. IMPRESSION: 1. Examination is significantly limited by patient body habitus and related photopenia. 2. Evaluation of the pelvic structures particular is very limited; no obvious cervical mass appreciated. 3. Unchanged enlarged portacaval lymph node measuring up to 2.8 x 1.6 cm, as previously reported, nonspecific and most likely reactive. Unchanged prominent inguinal lymph nodes. No specific evidence of nodal metastatic disease. Continued attention on follow-up. 4. Unchanged anterior mediastinal nodule just below the thoracic inlet measuring 2.9 x 1.5 cm. This was not  visualized on recent prior thyroid ultrasound, but nonetheless may reflect a substernal thyroid nodule or  ectopic thyroid tissue. Alternately this could reflect an anterior mediastinal mass such as a thymoma. At either rate, this does not likely reflect metastatic disease cervical cancer. Contrast enhanced MR of the chest or nuclear thyroid uptake scan may be helpful to further assess. 5. Hepatomegaly. Aortic Atherosclerosis (ICD10-I70.0). Electronically Signed   By: Jearld Lesch M.D.   On: 11/22/2022 22:31        PHYSICAL EXAMINATION: ECOG PERFORMANCE STATUS: {CHL ONC ECOG [phone]}  There were no vitals filed for this visit. There were no vitals filed for this visit.  GENERAL:alert, no distress and comfortable SKIN: skin color, texture, turgor are normal, no rashes or significant lesions EYES: normal, Conjunctiva are pink and non-injected, sclera clear OROPHARYNX:no exudate, no erythema and lips, buccal mucosa, and tongue normal  NECK: supple, thyroid normal size, non-tender, without nodularity LYMPH:  no palpable lymphadenopathy in the cervical, axillary or inguinal LUNGS: clear to auscultation and percussion with normal breathing effort HEART: regular rate & rhythm and no murmurs and no lower extremity edema ABDOMEN:abdomen soft, non-tender and normal bowel sounds Musculoskeletal:no cyanosis of digits and no clubbing  NEURO: alert & oriented x 3 with fluent speech, no focal motor/sensory deficits  LABORATORY DATA:  I have reviewed the data as listed    Component Value Date/Time   NA 140 11/22/2022 0942   NA 141 10/09/2022 1439   K 4.2 11/22/2022 0942   CL 104 11/22/2022 0942   CO2 30 11/22/2022 0942   GLUCOSE 253 (H) 11/22/2022 0942   BUN 8 11/22/2022 0942   BUN 7 (L) 10/09/2022 1439   CREATININE 0.83 11/22/2022 0942   CREATININE 0.87 10/25/2015 1223   CALCIUM 9.3 11/22/2022 0942   PROT 7.6 11/22/2022 0942   PROT 8.1 10/09/2022 1439   ALBUMIN 3.4 (L) 11/22/2022 0942    ALBUMIN 4.0 10/09/2022 1439   AST 12 (L) 11/22/2022 0942   ALT 8 11/22/2022 0942   ALKPHOS 101 11/22/2022 0942   BILITOT 0.3 11/22/2022 0942   GFRNONAA >60 11/22/2022 0942   GFRAA 104 12/07/2019 1553    No results found for: "SPEP", "UPEP"  Lab Results  Component Value Date   WBC 7.3 11/22/2022   NEUTROABS 4.5 11/22/2022   HGB 12.1 11/22/2022   HCT 41.0 11/22/2022   MCV 82.0 11/22/2022   PLT 296 11/22/2022      Chemistry      Component Value Date/Time   NA 140 11/22/2022 0942   NA 141 10/09/2022 1439   K 4.2 11/22/2022 0942   CL 104 11/22/2022 0942   CO2 30 11/22/2022 0942   BUN 8 11/22/2022 0942   BUN 7 (L) 10/09/2022 1439   CREATININE 0.83 11/22/2022 0942   CREATININE 0.87 10/25/2015 1223      Component Value Date/Time   CALCIUM 9.3 11/22/2022 0942   ALKPHOS 101 11/22/2022 0942   AST 12 (L) 11/22/2022 0942   ALT 8 11/22/2022 0942   BILITOT 0.3 11/22/2022 0942       RADIOGRAPHIC STUDIES: I have personally reviewed the radiological images as listed and agreed with the findings in the report. CT CHEST ABDOMEN PELVIS W CONTRAST  Result Date: 11/22/2022 CLINICAL DATA:  Cervical cancer, assess treatment response, ongoing oral chemotherapy * Tracking Code: BO * EXAM: CT CHEST, ABDOMEN, AND PELVIS WITH CONTRAST TECHNIQUE: Multidetector CT imaging of the chest, abdomen and pelvis was performed following the standard protocol during bolus administration of intravenous contrast. RADIATION DOSE REDUCTION: This exam was performed according to the departmental dose-optimization  program which includes automated exposure control, adjustment of the mA and/or kV according to patient size and/or use of iterative reconstruction technique. CONTRAST:  OMNIPAQUE IOHEXOL 300 MG/ML SOLN additional oral enteric contrast COMPARISON:  CT chest abdomen pelvis, 08/19/2022, thyroid ultrasound, 08/29/2022 FINDINGS: Examination is significantly limited by patient body habitus and related  photopenia. CT CHEST FINDINGS Cardiovascular: No significant vascular findings. Mild cardiomegaly. No pericardial effusion. Mediastinum/Nodes: Unchanged anterior mediastinal nodule just below the thoracic inlet measuring 2.9 x 1.5 cm (series 2, image 16). No enlarged mediastinal, hilar, or axillary lymph nodes. Heterogeneously enlarged thyroid gland, previously assessed by dedicated thyroid ultrasound. This has been evaluated on previous imaging. (ref: J Am Coll Radiol. 2015 Feb;12(2): 143-50). Trachea, and esophagus demonstrate no significant findings. Lungs/Pleura: Unchanged bandlike scarring or atelectasis throughout the lungs. No pleural effusion or pneumothorax. Musculoskeletal: No chest wall abnormality. No acute osseous findings. CT ABDOMEN PELVIS FINDINGS Hepatobiliary: Hepatomegaly, maximum coronal span 20.3 cm. No solid liver abnormality is seen. No gallstones, gallbladder wall thickening, or biliary dilatation. Pancreas: Unremarkable. No pancreatic ductal dilatation or surrounding inflammatory changes. Spleen: Normal in size without significant abnormality. Adrenals/Urinary Tract: Adrenal glands are unremarkable. Kidneys are normal, without renal calculi, solid lesion, or hydronephrosis. Bladder is unremarkable. Stomach/Bowel: Stomach is within normal limits. Appendix appears normal. No evidence of bowel wall thickening, distention, or inflammatory changes. Vascular/Lymphatic: Aortic atherosclerosis. Unchanged enlarged portacaval lymph node measuring up to 2.8 x 1.6 cm (series 2, image 67). Unchanged prominent inguinal lymph nodes (series 2, image 115). Reproductive: IUD present in the fundal endometrial cavity (series 2, image 106). Evaluation of the pelvic structures very limited by body habitus and photopenia; no obvious cervical mass appreciated. Other: No abdominal wall hernia or abnormality. No ascites. Musculoskeletal: No acute osseous findings. IMPRESSION: 1. Examination is significantly limited  by patient body habitus and related photopenia. 2. Evaluation of the pelvic structures particular is very limited; no obvious cervical mass appreciated. 3. Unchanged enlarged portacaval lymph node measuring up to 2.8 x 1.6 cm, as previously reported, nonspecific and most likely reactive. Unchanged prominent inguinal lymph nodes. No specific evidence of nodal metastatic disease. Continued attention on follow-up. 4. Unchanged anterior mediastinal nodule just below the thoracic inlet measuring 2.9 x 1.5 cm. This was not visualized on recent prior thyroid ultrasound, but nonetheless may reflect a substernal thyroid nodule or ectopic thyroid tissue. Alternately this could reflect an anterior mediastinal mass such as a thymoma. At either rate, this does not likely reflect metastatic disease cervical cancer. Contrast enhanced MR of the chest or nuclear thyroid uptake scan may be helpful to further assess. 5. Hepatomegaly. Aortic Atherosclerosis (ICD10-I70.0). Electronically Signed   By: Jearld Lesch M.D.   On: 11/22/2022 22:31

## 2022-11-26 ENCOUNTER — Inpatient Hospital Stay: Payer: Medicaid Other | Admitting: Hematology and Oncology

## 2022-11-26 ENCOUNTER — Encounter: Payer: Self-pay | Admitting: Hematology and Oncology

## 2022-11-26 DIAGNOSIS — Z6841 Body Mass Index (BMI) 40.0 and over, adult: Secondary | ICD-10-CM

## 2022-11-26 DIAGNOSIS — C549 Malignant neoplasm of corpus uteri, unspecified: Secondary | ICD-10-CM

## 2022-12-02 ENCOUNTER — Telehealth: Payer: Self-pay | Admitting: Oncology

## 2022-12-02 ENCOUNTER — Encounter: Payer: Self-pay | Admitting: Oncology

## 2022-12-02 NOTE — Telephone Encounter (Signed)
Attempted to call Hilda Lias regarding missed follow up appointment with Dr. Bertis Ruddy.  Call was unable to be completed as dialed.  Also send Korra a [employer] with a request to call back to schedule an appointment.

## 2022-12-04 NOTE — Telephone Encounter (Signed)
Attempted to call Deborah Benson and her phone is saying "call can't be completed at dialed."

## 2022-12-09 ENCOUNTER — Inpatient Hospital Stay: Payer: Medicaid Other | Attending: Psychiatry | Admitting: Psychiatry

## 2022-12-09 ENCOUNTER — Telehealth: Payer: Self-pay | Admitting: Oncology

## 2022-12-09 DIAGNOSIS — F17210 Nicotine dependence, cigarettes, uncomplicated: Secondary | ICD-10-CM | POA: Insufficient documentation

## 2022-12-09 DIAGNOSIS — E042 Nontoxic multinodular goiter: Secondary | ICD-10-CM | POA: Insufficient documentation

## 2022-12-09 DIAGNOSIS — C541 Malignant neoplasm of endometrium: Secondary | ICD-10-CM | POA: Insufficient documentation

## 2022-12-09 DIAGNOSIS — Z79890 Hormone replacement therapy (postmenopausal): Secondary | ICD-10-CM | POA: Insufficient documentation

## 2022-12-09 DIAGNOSIS — R141 Gas pain: Secondary | ICD-10-CM | POA: Insufficient documentation

## 2022-12-09 DIAGNOSIS — J45909 Unspecified asthma, uncomplicated: Secondary | ICD-10-CM | POA: Insufficient documentation

## 2022-12-09 NOTE — Telephone Encounter (Signed)
Called [facility] reading room regarding the CT results from 11/22/2022. Asked if the radiologist can change the diagnosis from cervical cancer to endometrial cancer and to evaluate the uterus if able.

## 2022-12-09 NOTE — Progress Notes (Signed)
Gynecologic Oncology Return Clinic Visit  Date of Service: 12/09/2022 Referring Provider: Valentina Shaggy, MD   Assessment & Plan: Deborah Benson is a 62 y.o. 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  Endometrial cancer: - Pt not a surgical candidate due to obesity and pulmonary status. Pt was admitted to the hospital for 5 days after her D&C due to hypoxia. Was previously unable to lay flat which limited her option for radiation treatment. Pt reports that she is now able to lay flat, but still suboptimal control of lung disease. - Currently being treated with IUD and letrozole. - Attempt at EMBx today, aborted due to pt discomfort and unable to pass the pipelle past the internal cervical os.  - Pt has follow-up with Dr. Bertis Ruddy in June with plan for repeat imaging around that time. - Can reattempt EMB at next visit pending her repeat imaging in June.  - Germline genetic testing negative. - Plan return visit in 3 months.   Morbid obesity: - Reviewed importance for weight loss both for her overall health as well as potential for additional treatment in the future for her endometrial cancer. - Discussed options for referral to nutrition, medical weight management clinic. Pt would like to proceed. Referrals place.   Poorly controlled asthma: - Last saw Pulmonology 07/09/22. At that time, recommendation was for follow-up in 3 months. Does not appear to follow-up scheduled. - Recommended that pt call to set-up follow-up as continued optimization will help pt with potential future treatment options for her cancer.    RTC 3 months.  Clide Cliff, MD Gynecologic Oncology   Medical Decision Making I personally spent  TOTAL 30 minutes face-to-face and non-face-to-face in the care of this patient, which includes all pre, intra, and post visit time on the date of  service.  ----------------------- Reason for Visit: Surveillance  Treatment History: Oncology History Overview Note  MSI stable, ER 95%, PR 100% Genetics neg   Uterine cancer (HCC)  04/17/2022 Imaging   US pelvis 1. Prominent abnormal endometrial thickening (41 mm). In the setting of post-menopausal bleeding, endometrial sampling is indicated to exclude carcinoma. If results are benign, sonohysterogram should be considered for focal lesion work-up.  2. Nonvisualization of the ovaries. No adnexal masses.   06/04/2022 Pathology Results   A. 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  See comment.    07/17/2022 Imaging   MRI pelvis 1. Maximal thickness of the endometrial canal approaches 2.5 cm, compared to gross thickening up to 4 cm on the previous exam. Presence is suspicious for residual tumor in the endometrium but shows improvement since previous imaging. Exam quality is compromised by patient's body habitus. Depth of myometrial invasion is difficult to assess on the current study but there is certainly no gross invasion beyond 50%. If additional MRI follow-up is desired direct involvement from a radiologist for protocol prescription may be helpful to mitigate artifacts and maximize image quality. 2. IUD in place 3. 11 mm RIGHT pelvic sidewall lymph node other smaller lymph nodes in the pelvis. This finding is nonspecific but suspicious in light of gynecologic neoplasm. PET imaging could be considered for further evaluation.   07/22/2022 Initial Diagnosis   Uterine cancer (HCC)   08/21/2022 Imaging   Lack of intravenous contrast administration and photon attenuation related to patient habitus limit evaluation of  the thoracic inlet and pelvis. Within this context:   1. IUD in place in the uterus. Pelvic structures not well evaluated on this  examination. 2. Pelvic sidewall lymph node seen on prior MRI is not identified on this examination, likely secondary to artifact from photon attenuation. 3. Enlarged periportal lymph nodes measure up to 16 mm in short axis, nonspecific and commonly reactive. However metastatic nodal disease while not favored is not excluded. Consider attention on follow-up imaging. 4. Lobular enlargement of the thyroid gland measuring up to 2.7 cm, suggest further evaluation with dedicated thyroid ultrasound. 5. Soft tissue nodule anterior to the great vessels and subjacent to the thyroid gland measuring 2.7 x 1.8 cm, is nonspecific possibly reflecting an enlarged lymph node or parathyroid adenoma. Consider further evaluation with contrast enhanced CT of the neck. 6. Stranding in the anterior abdominal wall, nonspecific but possibly reflecting cellulitis. 7.  Aortic Atherosclerosis (ICD10-I70.0).   08/21/2022 Cancer Staging   Staging form: Corpus Uteri - Carcinoma and Carcinosarcoma, AJCC 8th Edition - Clinical stage from 08/21/2022: Stage IIIC2 (cT1, cN2a, cM0) - Signed by Artis Delay, MD on 08/21/2022 Stage prefix: Initial diagnosis    Genetic Testing   [facility] CancerNext-Expanded Panel+RNA was Negative. Report date is 08/23/2022.  The CancerNext-Expanded gene panel offered by [facility] and includes sequencing, rearrangement, and RNA analysis for the following 71 genes: AIP, ALK, APC, ATM, AXIN2, BAP1, BARD1, BMPR1A, BRCA1, BRCA2, BRIP1, CDC73, CDH1, CDK4, CDKN1B, CDKN2A, CHEK2, CTNNA1, DICER1, FH, FLCN, KIF1B, LZTR1, MAX, MEN1, MET, MLH1, MSH2, MSH3, MSH6, MUTYH, NF1, NF2, NTHL1, PALB2, PHOX2B, PMS2, POT1, PRKAR1A, PTCH1, PTEN, RAD51C, RAD51D, RB1, RET, SDHA, SDHAF2, SDHB, SDHC, SDHD, SMAD4, SMARCA4, SMARCB1, SMARCE1, STK11, SUFU, TMEM127, TP53, TSC1, TSC2, and VHL (sequencing and deletion/duplication); EGFR, EGLN1, HOXB13, KIT, MITF, PDGFRA, POLD1, and POLE (sequencing only); EPCAM and GREM1 (deletion/duplication  only).    08/23/2022 -  Anti-estrogen oral therapy   She is started on Femara   08/29/2022 Imaging   1. Thyromegaly with findings suggestive of multinodular goiter. 2. Nodules labeled #1 and #4 both meet imaging criteria to recommend percutaneous sampling as indicated. 3. Nodule #3 meets imaging criteria to recommend a 1 year follow-up.  4. Note, the substernal nodule demonstrated on recent chest CT was not visualized sonographically due to substernal location. Further evaluation could be performed with nuclear medicine thyroid uptake and scan (to evaluate if this substernal tissue represents exophytic or ectopic thyroid parenchyma) versus contrast-enhanced neck CT as indicated.   11/25/2022 Tumor Marker   Patient's tumor was tested for the following markers: CA-125. Results of the tumor marker test revealed 9.2.   11/25/2022 Imaging   CT CHEST ABDOMEN PELVIS W CONTRAST  Result Date: 11/22/2022 CLINICAL DATA:  Cervical cancer, assess treatment response, ongoing oral chemotherapy * Tracking Code: BO * EXAM: CT CHEST, ABDOMEN, AND PELVIS WITH CONTRAST TECHNIQUE: Multidetector CT imaging of the chest, abdomen and pelvis was performed following the standard protocol during bolus administration of intravenous contrast. RADIATION DOSE REDUCTION: This exam was performed according to the departmental dose-optimization program which includes automated exposure control, adjustment of the mA and/or kV according to patient size and/or use of iterative reconstruction technique. CONTRAST:  OMNIPAQUE IOHEXOL 300 MG/ML SOLN additional oral enteric contrast COMPARISON:  CT chest abdomen pelvis, 08/19/2022, thyroid ultrasound, 08/29/2022 FINDINGS: Examination is significantly limited by patient body habitus and related photopenia. CT CHEST FINDINGS Cardiovascular: No significant vascular findings. Mild cardiomegaly. No pericardial effusion. Mediastinum/Nodes: Unchanged anterior mediastinal nodule just below the  thoracic  inlet measuring 2.9 x 1.5 cm (series 2, image 16). No enlarged mediastinal, hilar, or axillary lymph nodes. Heterogeneously enlarged thyroid gland, previously assessed by dedicated thyroid ultrasound. This has been evaluated on previous imaging. (ref: J Am Coll Radiol. 2015 Feb;12(2): 143-50). Trachea, and esophagus demonstrate no significant findings. Lungs/Pleura: Unchanged bandlike scarring or atelectasis throughout the lungs. No pleural effusion or pneumothorax. Musculoskeletal: No chest wall abnormality. No acute osseous findings. CT ABDOMEN PELVIS FINDINGS Hepatobiliary: Hepatomegaly, maximum coronal span 20.3 cm. No solid liver abnormality is seen. No gallstones, gallbladder wall thickening, or biliary dilatation. Pancreas: Unremarkable. No pancreatic ductal dilatation or surrounding inflammatory changes. Spleen: Normal in size without significant abnormality. Adrenals/Urinary Tract: Adrenal glands are unremarkable. Kidneys are normal, without renal calculi, solid lesion, or hydronephrosis. Bladder is unremarkable. Stomach/Bowel: Stomach is within normal limits. Appendix appears normal. No evidence of bowel wall thickening, distention, or inflammatory changes. Vascular/Lymphatic: Aortic atherosclerosis. Unchanged enlarged portacaval lymph node measuring up to 2.8 x 1.6 cm (series 2, image 67). Unchanged prominent inguinal lymph nodes (series 2, image 115). Reproductive: IUD present in the fundal endometrial cavity (series 2, image 106). Evaluation of the pelvic structures very limited by body habitus and photopenia; no obvious cervical mass appreciated. Other: No abdominal wall hernia or abnormality. No ascites. Musculoskeletal: No acute osseous findings. IMPRESSION: 1. Examination is significantly limited by patient body habitus and related photopenia. 2. Evaluation of the pelvic structures particular is very limited; no obvious cervical mass appreciated. 3. Unchanged enlarged portacaval lymph node  measuring up to 2.8 x 1.6 cm, as previously reported, nonspecific and most likely reactive. Unchanged prominent inguinal lymph nodes. No specific evidence of nodal metastatic disease. Continued attention on follow-up. 4. Unchanged anterior mediastinal nodule just below the thoracic inlet measuring 2.9 x 1.5 cm. This was not visualized on recent prior thyroid ultrasound, but nonetheless may reflect a substernal thyroid nodule or ectopic thyroid tissue. Alternately this could reflect an anterior mediastinal mass such as a thymoma. At either rate, this does not likely reflect metastatic disease cervical cancer. Contrast enhanced MR of the chest or nuclear thyroid uptake scan may be helpful to further assess. 5. Hepatomegaly. Aortic Atherosclerosis (ICD10-I70.0). Electronically Signed   By: Jearld Lesch M.D.   On: 11/22/2022 22:31        Interval History: ***   ***Pt presents today for endometrial cancer surveillance. She reports that her lungs are still "not great." She was last seen by Pulm in January. She isn't sure about follow-up. She also reports that she plans to see Dr. Shawnee Knapp with endocrinology for her thyroid.  She denies vaginal bleeding. She reports occasional pelvic cramping that is not everyday, only once in a while. When it occurs it only last for a few seconds and manageable. She reports that her appetite is decreased but she thinks this is due to the trulicity. She has been experiencing some hot flashes and decreased energy on femara.    Past Medical/Surgical History: Past Medical History:  Diagnosis Date   Arthritis    Asthma    Cancer (HCC)    ovarian cancer   Depression    Diabetes mellitus without complication (HCC)    oral meds only   Gout    HTN (hypertension)    "borderline" med was stopped by pt- MD aware.   Neuromuscular disorder (HCC)    neuropathy -hands/ feet   Neuropathy    Obesity    BMI >76.6   Pneumonia     Past Surgical History:  Procedure Laterality  Date   FOOT SURGERY     due to needle break in foot   HYSTEROSCOPY WITH D & C N/A 06/04/2022   Procedure: DILATATION AND CURETTAGE;  Surgeon: Clide Cliff, MD;  Location: [facility];  Service: Gynecology;  Laterality: N/A;   INTRAUTERINE DEVICE (IUD) INSERTION N/A 06/04/2022   Procedure: MIRENA INTRAUTERINE DEVICE (IUD) INSERTION;  Surgeon: Clide Cliff, MD;  Location: [facility];  Service: Gynecology;  Laterality: N/A;   MULTIPLE TOOTH EXTRACTIONS     TONSILLECTOMY      Family History  Problem Relation Age of Onset   Heart attack Mother    Stroke Mother    Gout Mother    Hypertension Mother    Lung cancer Father 42 - 42       smoked   Glaucoma Father    Heart disease Sister        fluid    Other Sister        spinal disease   Throat cancer Sister    Diabetes Brother    Prostate cancer Maternal Uncle    Ovarian cancer Maternal Grandmother    Dementia Maternal Grandfather    Glaucoma Other        nephew   Seizures Other        niece   Colon cancer Neg Hx    Breast cancer Neg Hx    Endometrial cancer Neg Hx    Pancreatic cancer Neg Hx     Social History   Socioeconomic History   Marital status: Single    Spouse name: Not on file   Number of children: 0   Years of education: Not on file   Highest education level: Not on file  Occupational History   Occupation: disabled  Tobacco Use   Smoking status: Every Day    Packs/day: .4    Types: Cigarettes    Start date: 06/10/1978   Smokeless tobacco: Never   Tobacco comments:    down to 5 per day - Previous 7-10  Vaping Use   Vaping Use: Never used  Substance and Sexual Activity   Alcohol use: No    Alcohol/week: 0.0 standard drinks of alcohol   Drug use: No   Sexual activity: Not Currently  Other Topics Concern   Not on file  Social History Narrative         Past Medical History:      Irregular menstrual periods      Asthma         Social History:      Disabled?,  Weight and OA; Moved to [place] 6 yrs ago.   Single         Social Determinants of Health   Financial Resource Strain: Not on file  Food Insecurity: No Food Insecurity (08/28/2022)   Hunger Vital Sign    Worried About Running Out of Food in the Last Year: Never true    Ran Out of Food in the Last Year: Never true  Transportation Needs: No Transportation Needs (08/28/2022)   PRAPARE - Administrator, Civil Service (Medical): No    Lack of Transportation (Non-Medical): No  Physical Activity: Not on file  Stress: Not on file  Social Connections: Not on file    Current Medications:  Current Outpatient Medications:    albuterol (PROVENTIL) (2.5 MG/3ML) 0.083% nebulizer solution, Take 3 mLs (2.5 mg total) by nebulization every 6 (six) hours as needed for wheezing or shortness of breath., Disp:  150 mL, Rfl: 1   cholecalciferol (VITAMIN D3) 25 MCG (1000 UNIT) tablet, Take 1,000 Units by mouth daily., Disp: , Rfl:    Dulaglutide (TRULICITY) 1.5 MG/0.5ML SOPN, ADMINISTER 1.5 MG UNDER THE SKIN 1 TIME A WEEK, Disp: 2 mL, Rfl: 0   Dulaglutide (TRULICITY) 3 MG/0.5ML SOPN, Inject 3 mg as directed once a week., Disp: 2 mL, Rfl: 1   gabapentin (NEURONTIN) 100 MG capsule, Take 1 capsule (100 mg total) by mouth 3 (three) times daily. (Patient taking differently: Take 100 mg by mouth daily.), Disp: 90 capsule, Rfl: 0   letrozole (FEMARA) 2.5 MG tablet, TAKE 1 TABLET(2.5 MG) BY MOUTH DAILY, Disp: 90 tablet, Rfl: 0   LORazepam (ATIVAN) 0.5 MG tablet, Take 1 tablet (0.5 mg total) by mouth every 8 (eight) hours., Disp: 30 tablet, Rfl: 0   metFORMIN (GLUCOPHAGE-XR) 500 MG 24 hr tablet, TAKE 1 TABLET(500 MG) BY MOUTH IN THE MORNING AND AT BEDTIME (Patient not taking: Reported on 10/09/2022), Disp: 60 tablet, Rfl: 0   nystatin (MYCOSTATIN/NYSTOP) powder, Apply 1 Application topically 3 (three) times daily as needed. (Patient taking differently: Apply 1 Application topically daily.), Disp: 60 g, Rfl: 5   predniSONE (DELTASONE) 20 MG tablet, Take 2  tablets (40 mg total) by mouth daily with breakfast., Disp: 10 tablet, Rfl: 0   PROAIR HFA 108 (90 Base) MCG/ACT inhaler, INHALE 2 PUFFS BY MOUTH EVERY 6 HOURS AS NEEDED FOR SHORTNESS OF BREATH. PLEASE MAKE AN APPOINTMENT, Disp: 17 g, Rfl: 0  Review of Symptoms: Complete 10-system review is positive for: ***  Physical Exam: There were no vitals taken for this visit. General: Alert, oriented, no acute distress. HEENT: Normocephalic, atraumatic. Sclera anicteric.  Chest: Normal work of breathing. Rhonchi in LLL. Cardiovascular: Regular rate and rhythm, no murmurs. Abdomen: Soft, nontender.  Normoactive bowel sounds.  No masses appreciated.   Extremities: Grossly normal range of motion.  Warm, well perfused.  No edema bilaterally. Skin: No rashes or lesions noted. Lymphatics: No cervical, supraclavicular, or inguinal adenopathy. GU: ***Normal appearing external genitalia without erythema, excoriation, or lesions.  Speculum exam reveals normal cervix with IUD strings visualized. Endometrial biopsy attempted but unable to pass the internal cervical os; attempt discontinued due to patient discomfort. Bimanual exam reveals normal cervix. Uterus and adnexa unable to be palpated due to body habitus.  Rectovaginal exam negative but limited by body habitus. Exam chaperoned by ***   Laboratory & Radiologic Studies: none

## 2022-12-19 ENCOUNTER — Telehealth: Payer: Self-pay | Admitting: Oncology

## 2022-12-19 ENCOUNTER — Ambulatory Visit: Payer: Medicaid Other | Admitting: Dietician

## 2022-12-19 NOTE — Telephone Encounter (Signed)
Attempted to call Deborah Benson and her phone has the message "not able to complete call as dialed."  Will keep trying to reach her.

## 2022-12-25 ENCOUNTER — Telehealth: Payer: Self-pay

## 2022-12-25 NOTE — Telephone Encounter (Signed)
Pt called office stating she missed her appointment with Dr. Alvester Morin on 7/1. She has been rescheduled for 7/22 @ 2:45, arrival time at 2:15. Pt agreed to date/time

## 2022-12-30 ENCOUNTER — Inpatient Hospital Stay (HOSPITAL_BASED_OUTPATIENT_CLINIC_OR_DEPARTMENT_OTHER): Payer: Medicaid Other | Admitting: Psychiatry

## 2022-12-30 ENCOUNTER — Encounter: Payer: Self-pay | Admitting: Psychiatry

## 2022-12-30 ENCOUNTER — Other Ambulatory Visit: Payer: Self-pay

## 2022-12-30 VITALS — BP 154/80 | HR 66 | Temp 99.0°F | Resp 18 | Ht 64.0 in | Wt 394.4 lb

## 2022-12-30 DIAGNOSIS — Z6841 Body Mass Index (BMI) 40.0 and over, adult: Secondary | ICD-10-CM

## 2022-12-30 DIAGNOSIS — Z79890 Hormone replacement therapy (postmenopausal): Secondary | ICD-10-CM | POA: Diagnosis not present

## 2022-12-30 DIAGNOSIS — J4541 Moderate persistent asthma with (acute) exacerbation: Secondary | ICD-10-CM

## 2022-12-30 DIAGNOSIS — C541 Malignant neoplasm of endometrium: Secondary | ICD-10-CM

## 2022-12-30 DIAGNOSIS — E041 Nontoxic single thyroid nodule: Secondary | ICD-10-CM

## 2022-12-30 DIAGNOSIS — J45909 Unspecified asthma, uncomplicated: Secondary | ICD-10-CM | POA: Diagnosis not present

## 2022-12-30 DIAGNOSIS — F17210 Nicotine dependence, cigarettes, uncomplicated: Secondary | ICD-10-CM | POA: Diagnosis not present

## 2022-12-30 DIAGNOSIS — R141 Gas pain: Secondary | ICD-10-CM | POA: Diagnosis not present

## 2022-12-30 DIAGNOSIS — E042 Nontoxic multinodular goiter: Secondary | ICD-10-CM | POA: Diagnosis not present

## 2022-12-30 NOTE — Progress Notes (Signed)
Gynecologic Oncology Return Clinic Visit  Date of Service: 12/30/2022 Referring Provider: Valentina Shaggy, MD   Assessment & Plan: Deborah Benson is a 62 y.o. 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  Endometrial cancer: - Pt not a surgical candidate due to obesity and pulmonary status. Pt was admitted to the hospital for 5 days after her D&C due to hypoxia. Was previously unable to lay flat which limited her option for radiation treatment, however, now able to lay flat and overall improving respiratory status. - Continue at this time with IUD and letrozole. - CT scan from 11/22/2022 reviewed with patient.  Stable enlarged portacaval lymph node, possibly reactive. - EMB collected today to assess treatment status. - If inadequate treatment with current regimen, given improvement in respiratory status, could reconsider definitive radiation treatment. - Germline genetic testing negative. - Plan return visit in 3 months.   Morbid obesity: -Patient has been working on lifestyle changes to help with weight loss and has lost over 20 pounds. - Patient congratulated on these changes. - Patient is still interested in possible nutrition appointment, weight management clinic.  She reports a change in phone number which has limited her ability to receive these appointment request.  She is provided with phone numbers today to call and schedule. Wt Readings from Last 5 Encounters:  12/30/22 (!) 394 lb 6.4 oz (178.9 kg)  10/09/22 (!) 409 lb (185.5 kg)  09/30/22 (!) 416 lb (188.7 kg)  09/26/22 (!) 414 lb (187.8 kg)  08/22/22 (!) 422 lb 1.6 oz (191.5 kg)   Poorly controlled asthma, improving: - Last saw Pulmonology 07/09/22. At that time, recommendation was for follow-up in 3 months.  - Patient instructed to call to schedule follow-up.   Thyroid nodules,  mediastinal nodule: -Unchanged mediastinal nodule at thoracic inlet on CT scan. - Per CT report, differential includes thyroid nodule, ectopic thyroid tissue, or thymoma. - Patient has referral in with endocrinology.  Appointment on 01/29/2023.  Patient encouraged to keep this appointment.    RTC 3 months.  Clide Cliff, MD Gynecologic Oncology   Medical Decision Making I personally spent  TOTAL 35 minutes face-to-face and non-face-to-face in the care of this patient, which includes all pre, intra, and post visit time on the date of service.  ----------------------- Reason for Visit: Surveillance  Treatment History: Oncology History Overview Note  MSI stable, ER 95%, PR 100% Genetics neg   Uterine cancer (HCC)  04/17/2022 Imaging   US pelvis 1. Prominent abnormal endometrial thickening (41 mm). In the setting of post-menopausal bleeding, endometrial sampling is indicated to exclude carcinoma. If results are benign, sonohysterogram should be considered for focal lesion work-up.  2. Nonvisualization of the ovaries. No adnexal masses.   06/04/2022 Pathology Results   A. 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  See comment.    07/17/2022 Imaging   MRI pelvis 1. Maximal thickness of the endometrial canal approaches 2.5 cm, compared to gross thickening up to 4 cm on the previous exam. Presence is suspicious for residual tumor in the endometrium but shows improvement since previous imaging. Exam quality is compromised by patient's body habitus. Depth of myometrial invasion is difficult to assess on the current study but there is certainly no gross invasion beyond 50%. If additional MRI follow-up is desired direct involvement from a  radiologist for protocol prescription may be helpful to mitigate artifacts and maximize image quality. 2. IUD in  place 3. 11 mm RIGHT pelvic sidewall lymph node other smaller lymph nodes in the pelvis. This finding is nonspecific but suspicious in light of gynecologic neoplasm. PET imaging could be considered for further evaluation.   07/22/2022 Initial Diagnosis   Uterine cancer (HCC)   08/21/2022 Imaging   Lack of intravenous contrast administration and photon attenuation related to patient habitus limit evaluation of the thoracic inlet and pelvis. Within this context:   1. IUD in place in the uterus. Pelvic structures not well evaluated on this examination. 2. Pelvic sidewall lymph node seen on prior MRI is not identified on this examination, likely secondary to artifact from photon attenuation. 3. Enlarged periportal lymph nodes measure up to 16 mm in short axis, nonspecific and commonly reactive. However metastatic nodal disease while not favored is not excluded. Consider attention on follow-up imaging. 4. Lobular enlargement of the thyroid gland measuring up to 2.7 cm, suggest further evaluation with dedicated thyroid ultrasound. 5. Soft tissue nodule anterior to the great vessels and subjacent to the thyroid gland measuring 2.7 x 1.8 cm, is nonspecific possibly reflecting an enlarged lymph node or parathyroid adenoma. Consider further evaluation with contrast enhanced CT of the neck. 6. Stranding in the anterior abdominal wall, nonspecific but possibly reflecting cellulitis. 7.  Aortic Atherosclerosis (ICD10-I70.0).   08/21/2022 Cancer Staging   Staging form: Corpus Uteri - Carcinoma and Carcinosarcoma, AJCC 8th Edition - Clinical stage from 08/21/2022: Stage IIIC2 (cT1, cN2a, cM0) - Signed by Artis Delay, MD on 08/21/2022 Stage prefix: Initial diagnosis    Genetic Testing   [facility] CancerNext-Expanded Panel+RNA was Negative. Report date is 08/23/2022.  The CancerNext-Expanded gene panel offered by [facility] and includes sequencing, rearrangement, and RNA analysis for the following 71 genes: AIP,  ALK, APC, ATM, AXIN2, BAP1, BARD1, BMPR1A, BRCA1, BRCA2, BRIP1, CDC73, CDH1, CDK4, CDKN1B, CDKN2A, CHEK2, CTNNA1, DICER1, FH, FLCN, KIF1B, LZTR1, MAX, MEN1, MET, MLH1, MSH2, MSH3, MSH6, MUTYH, NF1, NF2, NTHL1, PALB2, PHOX2B, PMS2, POT1, PRKAR1A, PTCH1, PTEN, RAD51C, RAD51D, RB1, RET, SDHA, SDHAF2, SDHB, SDHC, SDHD, SMAD4, SMARCA4, SMARCB1, SMARCE1, STK11, SUFU, TMEM127, TP53, TSC1, TSC2, and VHL (sequencing and deletion/duplication); EGFR, EGLN1, HOXB13, KIT, MITF, PDGFRA, POLD1, and POLE (sequencing only); EPCAM and GREM1 (deletion/duplication only).    08/23/2022 -  Anti-estrogen oral therapy   She is started on Femara   08/29/2022 Imaging   1. Thyromegaly with findings suggestive of multinodular goiter. 2. Nodules labeled #1 and #4 both meet imaging criteria to recommend percutaneous sampling as indicated. 3. Nodule #3 meets imaging criteria to recommend a 1 year follow-up.  4. Note, the substernal nodule demonstrated on recent chest CT was not visualized sonographically due to substernal location. Further evaluation could be performed with nuclear medicine thyroid uptake and scan (to evaluate if this substernal tissue represents exophytic or ectopic thyroid parenchyma) versus contrast-enhanced neck CT as indicated.   11/25/2022 Tumor Marker   Patient's tumor was tested for the following markers: CA-125. Results of the tumor marker test revealed 9.2.   11/25/2022 Imaging   CT CHEST ABDOMEN PELVIS W CONTRAST  Result Date: 11/22/2022 CLINICAL DATA:  Cervical cancer, assess treatment response, ongoing oral chemotherapy * Tracking Code: BO * EXAM: CT CHEST, ABDOMEN, AND PELVIS WITH CONTRAST TECHNIQUE: Multidetector CT imaging of the chest, abdomen and pelvis was performed following the standard protocol during bolus administration of intravenous contrast. RADIATION DOSE REDUCTION: This exam was performed according  to the departmental dose-optimization program which includes automated exposure control,  adjustment of the mA and/or kV according to patient size and/or use of iterative reconstruction technique. CONTRAST:  OMNIPAQUE IOHEXOL 300 MG/ML SOLN additional oral enteric contrast COMPARISON:  CT chest abdomen pelvis, 08/19/2022, thyroid ultrasound, 08/29/2022 FINDINGS: Examination is significantly limited by patient body habitus and related photopenia. CT CHEST FINDINGS Cardiovascular: No significant vascular findings. Mild cardiomegaly. No pericardial effusion. Mediastinum/Nodes: Unchanged anterior mediastinal nodule just below the thoracic inlet measuring 2.9 x 1.5 cm (series 2, image 16). No enlarged mediastinal, hilar, or axillary lymph nodes. Heterogeneously enlarged thyroid gland, previously assessed by dedicated thyroid ultrasound. This has been evaluated on previous imaging. (ref: J Am Coll Radiol. 2015 Feb;12(2): 143-50). Trachea, and esophagus demonstrate no significant findings. Lungs/Pleura: Unchanged bandlike scarring or atelectasis throughout the lungs. No pleural effusion or pneumothorax. Musculoskeletal: No chest wall abnormality. No acute osseous findings. CT ABDOMEN PELVIS FINDINGS Hepatobiliary: Hepatomegaly, maximum coronal span 20.3 cm. No solid liver abnormality is seen. No gallstones, gallbladder wall thickening, or biliary dilatation. Pancreas: Unremarkable. No pancreatic ductal dilatation or surrounding inflammatory changes. Spleen: Normal in size without significant abnormality. Adrenals/Urinary Tract: Adrenal glands are unremarkable. Kidneys are normal, without renal calculi, solid lesion, or hydronephrosis. Bladder is unremarkable. Stomach/Bowel: Stomach is within normal limits. Appendix appears normal. No evidence of bowel wall thickening, distention, or inflammatory changes. Vascular/Lymphatic: Aortic atherosclerosis. Unchanged enlarged portacaval lymph node measuring up to 2.8 x 1.6 cm (series 2, image 67). Unchanged prominent inguinal lymph nodes (series 2, image 115).  Reproductive: IUD present in the fundal endometrial cavity (series 2, image 106). Evaluation of the pelvic structures very limited by body habitus and photopenia; no obvious cervical mass appreciated. Other: No abdominal wall hernia or abnormality. No ascites. Musculoskeletal: No acute osseous findings. IMPRESSION: 1. Examination is significantly limited by patient body habitus and related photopenia. 2. Evaluation of the pelvic structures particular is very limited; no obvious cervical mass appreciated. 3. Unchanged enlarged portacaval lymph node measuring up to 2.8 x 1.6 cm, as previously reported, nonspecific and most likely reactive. Unchanged prominent inguinal lymph nodes. No specific evidence of nodal metastatic disease. Continued attention on follow-up. 4. Unchanged anterior mediastinal nodule just below the thoracic inlet measuring 2.9 x 1.5 cm. This was not visualized on recent prior thyroid ultrasound, but nonetheless may reflect a substernal thyroid nodule or ectopic thyroid tissue. Alternately this could reflect an anterior mediastinal mass such as a thymoma. At either rate, this does not likely reflect metastatic disease cervical cancer. Contrast enhanced MR of the chest or nuclear thyroid uptake scan may be helpful to further assess. 5. Hepatomegaly. Aortic Atherosclerosis (ICD10-I70.0). Electronically Signed   By: Jearld Lesch M.D.   On: 11/22/2022 22:31        Interval History: Patient presents today with her cousin.  She reports that her asthma is doing much better.  She is able to lay flat when sleeping.  She only needs her rescue inhaler about 2-3 times a week.  She has also been working on losing weight.  Recently in the past week she has been having some abdominal gas pains.  She took some MiraLAX, had a bowel movement and has had some improvement but still present.  Denies constipation otherwise.  She ports otherwise occasional light vaginal bleeding.    Past Medical/Surgical  History: Past Medical History:  Diagnosis Date   Arthritis    Asthma    Cancer (HCC)    ovarian cancer   Depression  Diabetes mellitus without complication (HCC)    oral meds only   Gout    HTN (hypertension)    "borderline" med was stopped by pt- MD aware.   Neuromuscular disorder (HCC)    neuropathy -hands/ feet   Neuropathy    Obesity    BMI >76.6   Pneumonia     Past Surgical History:  Procedure Laterality Date   FOOT SURGERY     due to needle break in foot   HYSTEROSCOPY WITH D & C N/A 06/04/2022   Procedure: DILATATION AND CURETTAGE;  Surgeon: Clide Cliff, MD;  Location: [facility];  Service: Gynecology;  Laterality: N/A;   INTRAUTERINE DEVICE (IUD) INSERTION N/A 06/04/2022   Procedure: MIRENA INTRAUTERINE DEVICE (IUD) INSERTION;  Surgeon: Clide Cliff, MD;  Location: [facility];  Service: Gynecology;  Laterality: N/A;   MULTIPLE TOOTH EXTRACTIONS     TONSILLECTOMY      Family History  Problem Relation Age of Onset   Heart attack Mother    Stroke Mother    Gout Mother    Hypertension Mother    Lung cancer Father 12 - 62       smoked   Glaucoma Father    Heart disease Sister        fluid    Other Sister        spinal disease   Throat cancer Sister    Diabetes Brother    Prostate cancer Maternal Uncle    Ovarian cancer Maternal Grandmother    Dementia Maternal Grandfather    Glaucoma Other        nephew   Seizures Other        niece   Colon cancer Neg Hx    Breast cancer Neg Hx    Endometrial cancer Neg Hx    Pancreatic cancer Neg Hx     Social History   Socioeconomic History   Marital status: Single    Spouse name: Not on file   Number of children: 0   Years of education: Not on file   Highest education level: Not on file  Occupational History   Occupation: disabled  Tobacco Use   Smoking status: Every Day    Current packs/day: 0.40    Average packs/day: 0.4 packs/day for 44.6 years (17.8 ttl pk-yrs)    Types: Cigarettes    Start  date: 06/10/1978   Smokeless tobacco: Never   Tobacco comments:    down to 5 per day - Previous 7-10  Vaping Use   Vaping status: Never Used  Substance and Sexual Activity   Alcohol use: No    Alcohol/week: 0.0 standard drinks of alcohol   Drug use: No   Sexual activity: Not Currently  Other Topics Concern   Not on file  Social History Narrative         Past Medical History:      Irregular menstrual periods      Asthma         Social History:      Disabled?,  Weight and OA; Moved to [place] 6 yrs ago.  Single         Social Determinants of Health   Financial Resource Strain: Not on file  Food Insecurity: No Food Insecurity (08/28/2022)   Hunger Vital Sign    Worried About Running Out of Food in the Last Year: Never true    Ran Out of Food in the Last Year: Never true  Transportation Needs: No Transportation Needs (08/28/2022)  PRAPARE - Administrator, Civil Service (Medical): No    Lack of Transportation (Non-Medical): No  Physical Activity: Not on file  Stress: Not on file  Social Connections: Unknown (11/03/2022)   Received from [facility]   Social Network    Social Network: Not on file    Current Medications:  Current Outpatient Medications:    albuterol (PROVENTIL) (2.5 MG/3ML) 0.083% nebulizer solution, Take 3 mLs (2.5 mg total) by nebulization every 6 (six) hours as needed for wheezing or shortness of breath., Disp: 150 mL, Rfl: 1   cholecalciferol (VITAMIN D3) 25 MCG (1000 UNIT) tablet, Take 1,000 Units by mouth daily., Disp: , Rfl:    Dulaglutide (TRULICITY) 3 MG/0.5ML SOPN, Inject 3 mg as directed once a week., Disp: 2 mL, Rfl: 1   gabapentin (NEURONTIN) 100 MG capsule, Take 1 capsule (100 mg total) by mouth 3 (three) times daily. (Patient taking differently: Take 100 mg by mouth 2 (two) times daily.), Disp: 90 capsule, Rfl: 0   metFORMIN (GLUCOPHAGE-XR) 500 MG 24 hr tablet, TAKE 1 TABLET(500 MG) BY MOUTH IN THE MORNING AND AT BEDTIME, Disp: 60  tablet, Rfl: 0   nystatin (MYCOSTATIN/NYSTOP) powder, Apply 1 Application topically 3 (three) times daily as needed. (Patient taking differently: Apply 1 Application topically daily.), Disp: 60 g, Rfl: 5   PROAIR HFA 108 (90 Base) MCG/ACT inhaler, INHALE 2 PUFFS BY MOUTH EVERY 6 HOURS AS NEEDED FOR SHORTNESS OF BREATH. PLEASE MAKE AN APPOINTMENT, Disp: 17 g, Rfl: 0  Review of Symptoms: Complete 10-system review is positive for: Constipation, urinary frequency, headache, abdominal distention, abdominal pain, vaginal spotting, hot flashes  Physical Exam: BP (!) 154/80 (BP Location: Right Arm, Patient Position: Sitting)   Pulse 66   Temp 99 F (37.2 C) (Oral)   Resp 18   Ht 5\' 4"  (1.626 m)   Wt (!) 394 lb 6.4 oz (178.9 kg)   SpO2 100%   BMI 67.70 kg/m  General: Alert, oriented, no acute distress. HEENT: Normocephalic, atraumatic. Sclera anicteric.  Chest: Normal work of breathing. Rhonchi in LLL but improved from prior. Cardiovascular: Regular rate and rhythm, no murmurs. Abdomen: Soft, nontender.  Normoactive bowel sounds.  No masses appreciated.   Extremities: Grossly normal range of motion.  Warm, well perfused.  No edema bilaterally. Skin: No rashes or lesions noted. Lymphatics: No cervical, supraclavicular, or inguinal adenopathy. GU: Normal appearing external genitalia without erythema, excoriation, or lesions.  Speculum exam reveals normal cervix with IUD strings visualized. Bimanual exam reveals normal cervix. Uterus and adnexa unable to be palpated due to body habitus. Exam chaperoned by Warner Mccreedy, NP   EMB Procedure: After appropriate verbal informed consent was obtained, a timeout was performed. A sterile speculum was placed in the vagina, and the area was cleaned with betadine x3. A single-tooth tenaculum was placed on the anterior lip of the cervix.  A cervical block was placed with 10 mL of 2% lidocaine at 4 and 8:00.  The os finder was used to dilate the cervix. An  endometrial biopsy pipelle was advanced carefully to the uterine fundus which sounded to 10cm. An adequate sample was obtained over 1 pass. The tenaculum was removed, and tenaculum sites were noted to be hemostatic. The speculum was removed from the vagina. The patient tolerated the procedure well.   UPT: not indicated   Laboratory & Radiologic Studies: none

## 2022-12-30 NOTE — Patient Instructions (Addendum)
We will contact you with the results of your endometrial biopsy from today.  It was a pleasure to see you in clinic today. - Call pulmonology office to check about follow-up. - Return visit planned for follow up in three months or sooner if needed.  Phone number for [facility] is [phone]  Endocrinology Appt will be with [facility] in [place] with phone # [phone]- Aug 21st at 2:20 pm with Dr. Shawnee Knapp. Address is [address] in [place] [address].  Thank you very much for allowing me to provide care for you today.  I appreciate your confidence in choosing our Gynecologic Oncology team at [facility].  If you have any questions about your visit today please call our office or send [place] a MyChart message and we will get back to you as soon as possible.

## 2023-01-01 LAB — SURGICAL PATHOLOGY

## 2023-01-20 ENCOUNTER — Other Ambulatory Visit: Payer: Self-pay | Admitting: Hematology and Oncology

## 2023-01-21 ENCOUNTER — Telehealth: Payer: Self-pay | Admitting: Oncology

## 2023-01-21 NOTE — Telephone Encounter (Signed)
Future refill with Dr. Confluence Blas office

## 2023-01-21 NOTE — Telephone Encounter (Signed)
Called Kelana and advised that Dr. Bertis Ruddy has sent in a refill for letrozole to [facility].  Let her know that the next refill will need to be from Dr. Alvester Morin since she is not following up with Dr. Bertis Ruddy.  Tilden verbalized understanding.

## 2023-01-28 ENCOUNTER — Ambulatory Visit: Payer: Medicaid Other | Admitting: Family Medicine

## 2023-01-28 NOTE — Progress Notes (Deleted)
    SUBJECTIVE:   CHIEF COMPLAINT / HPI:   DIABETES Stopped metformin felt it interfered with her vision Out of trulicity for several weeks due to supply problems Urinating frequently   ENDOMETRIAL CANCER Under treatment.   BREATHING COPD? Saw Dr Sherri Rad in Jan 2024 Only using albuterol.  For last weeks or so having cough and shortness of breath with using albuterol every 4 hours.  No fever or chest pain    Tobacco Down to 4 cigs per day.    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   Postmenopausal bleeding 04/12/2022   Candidiasis 04/12/2022   Alkaline phosphatase elevation 09/06/2020   Vaginal bleeding 12/07/2019   Rectal bleeding 12/07/2019   BMI 70 and over, adult (HCC) 01/22/2016   Hypertension 10/16/2009   TOBACCO USE 12/01/2008   LEG PAIN, BILATERAL 12/01/2008   DM2 (diabetes mellitus, type 2) (HCC) 07/11/2008   Asthma 08/07/2006   OSTEOARTHRITIS, MULTI SITES 08/07/2006    Current Outpatient Medications  Medication Instructions   albuterol (PROVENTIL) 2.5 mg, Nebulization, Every 6 hours PRN   cholecalciferol (VITAMIN D3) 1,000 Units, Oral, Daily   gabapentin (NEURONTIN) 100 mg, Oral, 3 times daily   letrozole (FEMARA) 2.5 MG tablet TAKE 1 TABLET(2.5 MG) BY MOUTH DAILY   metFORMIN (GLUCOPHAGE-XR) 500 MG 24 hr tablet TAKE 1 TABLET(500 MG) BY MOUTH IN THE MORNING AND AT BEDTIME   nystatin (MYCOSTATIN/NYSTOP) powder 1 Application, Topical, 3 times daily PRN   PROAIR HFA 108 (90 Base) MCG/ACT inhaler INHALE 2 PUFFS BY MOUTH EVERY 6 HOURS AS NEEDED FOR SHORTNESS OF BREATH. PLEASE MAKE  AN APPOINTMENT   Trulicity 3 mg, Injection, Weekly       12/30/2022    2:36 PM 10/09/2022   11:37 AM 10/09/2022   10:55 AM  Vitals with BMI  Height 5\' 4"   5\' 4"   Weight 394 lbs 6 oz  409 lbs  BMI 67.67  70.17  Systolic 154 161 161  Diastolic 80 89 113  Pulse 66  81      OBJECTIVE:   There were no vitals taken for this visit.  ***  ASSESSMENT/PLAN:   There are no diagnoses linked to this encounter.   There are no Patient Instructions on file for this visit.   Carney Living, MD [facility]

## 2023-02-11 ENCOUNTER — Other Ambulatory Visit: Payer: Self-pay

## 2023-02-11 ENCOUNTER — Encounter: Payer: Self-pay | Admitting: Family Medicine

## 2023-02-11 ENCOUNTER — Ambulatory Visit: Payer: Medicaid Other | Admitting: Family Medicine

## 2023-02-11 VITALS — BP 167/88 | HR 75 | Ht 64.0 in | Wt 393.2 lb

## 2023-02-11 DIAGNOSIS — F4321 Adjustment disorder with depressed mood: Secondary | ICD-10-CM | POA: Insufficient documentation

## 2023-02-11 DIAGNOSIS — E119 Type 2 diabetes mellitus without complications: Secondary | ICD-10-CM | POA: Diagnosis present

## 2023-02-11 DIAGNOSIS — I10 Essential (primary) hypertension: Secondary | ICD-10-CM

## 2023-02-11 LAB — POCT GLYCOSYLATED HEMOGLOBIN (HGB A1C): HbA1c, POC (controlled diabetic range): 9 % — AB (ref 0.0–7.0)

## 2023-02-11 MED ORDER — TRULICITY 3 MG/0.5ML ~~LOC~~ SOAJ: 3.0000 mg | SUBCUTANEOUS | 0 refills | Status: DC

## 2023-02-11 MED ORDER — TRAZODONE HCL 50 MG PO TABS: 25.0000 mg | ORAL_TABLET | Freq: Every evening | ORAL | 0 refills | Status: DC | PRN

## 2023-02-11 NOTE — Progress Notes (Signed)
SUBJECTIVE:   CHIEF COMPLAINT / HPI:   Accompanied by her 61 yo mother today  Grief Her partner of 17 years, her dog and a close relative all died in the last few weeks.  She is having trouble sleeping, eating and concentrating.  Living with her mom who is helping her.   Has never seen a therapist or taken depression medications   Diabetes Taking metformin once a day and trulicity 3 mg weekly.  Has not felt like eating much in the last 1-2 weeks \   OBJECTIVE:   BP (!) 167/88   Pulse 75   Ht 5\' 4"  (1.626 m)   Wt (!) 393 lb 3.2 oz (178.4 kg)   SpO2 100%   BMI 67.49 kg/m   Psych:  Cognition and judgment appear intact. Alert, communicative  and cooperative with normal attention span and concentration. No apparent delusions, illusions, hallucinations Heart - Regular rate and rhythm.  No murmurs, gallops or rubs.    Lungs:  Normal respiratory effort, chest expands symmetrically. Lungs are clear to auscultation, no crackles or wheezes.    ASSESSMENT/PLAN:   Type 2 diabetes mellitus without complication, without long-term current use of insulin (HCC) Assessment & Plan: Not at goal.  If not losing weight due to grief will increase Trulicity at next visit.  Feels she can't take more than one metformin tablet a day as causes her vision problems.    Orders: -     POCT glycosylated hemoglobin (Hb A1C)  Grief Assessment & Plan: Due to several important deaths. Trial of trazodone for sleep.  Close follow up in 1-2 weeks.  Gave list of counselors   Orders: -     traZODone HCl; Take 0.5-1 tablets (25-50 mg total) by mouth at bedtime as needed for sleep.  Dispense: 30 tablet; Refill: 0  Primary hypertension Assessment & Plan: Not at goal.  Will likely start blood pressure medication next visit if still high.    BP Readings from Last 3 Encounters:  02/11/23 (!) 167/88  12/30/22 (!) 154/80  10/09/22 (!) 161/89          Patient Instructions  Good to see you today -  Thank you for coming in  Things we discussed today:  Diabetes Come back in 2 weeks to see if you are losing weight If not we will increase your Trulicity Will check your blood pressure then    Grief Take trazodone 25-50 mg an hour before bedtime Go to bed and get up same time - Have a routine  Therapy and Counseling Resources Most providers on this list will take Medicaid. Patients with commercial insurance or Medicare should contact their insurance company to get a list of in network providers.  [facility] (takes children) Location 1: [address] [place], [place] [address] Location 2: [address] [place], [place] [address] [phone]   [facility] (spanish speaking therapist available)(habla espanol)(take medicare and medicaid)  [address], [place], [place] [address], [email or web address] [phone]  [facility]:Psychiatry and Counseling [address] [facility] [facility] [address] [place], [place] [address] [phone]  [facility]   [address] [place], [place] [address]      [phone]  [facility] (spanish available) [address]  [place], [place] [address] [phone]  [facility] (take [facility] and medicare) [address]  [place], [place] [address]       [phone]     [facility] (virtual only)  [phone]  Jovita Kussmaul [facility] [address], [place], [place] [phone]  [facility]:  [address] [place] [phone]  [facility]:  [address] [place] [phone]  [facility] (under & uninsured) [address]   [place] [phone]    [email or web address]    [facility] [address]  [place]    [phone]  [facility] -[address]     Phone:  [phone]     [facility]-  [address]  [phone]   [facility] [address]      [place], [place] [phone]  [facility]: [address] [place], [place], [place]  [phone]   [facility] (Spanish therapist) [email or web address]  [address] [place]  [address]   [place] [address]    [phone]    [facility]   [address],  [place], [place]  [phone]   [facility]  [address] [place]  [phone]  [facility]  [address] [place], [place]        [phone]  Open Access/Walk In Clinic under & uninsured  [facility]  [address] [place], [place] [phone] Crisis [phone]  [facility] [place],  (Spanish)   [address], [place]: [phone]) 8:30 - 12; 1 - 2:30  [facility] [employer],  [address] [place], [place]    [phone]):8:30 - 12; 2 - 3PM  [facility] [employer],  [address]  [place]; [phone]):   Mon - Fri 8 AM - 5 PM  [facility] [address] [place]  MWF 12:30 to 3:00 or call to schedule an appointment  [phone]  Specific Provider options Psychology Today  [email or web address].com/us click on find a therapist  enter your zip code left side and select or tailor a therapist for your specific need.   [facility] Provider Directory [email or web address]  (Medicaid)   Follow all drop down to find a provider  Social Support program Mental Health [place] [phone] or PhotoSolver.pl [address], [place], [place] Recovery support and educational   24- Hour Availability:   [facility]  [address] [place], [place] [phone] Crisis [phone]  [facility] [phone]  [place] [facility]   [phone]   [facility]  [phone] (after hours)  Therapeutic Alternative/Mobile Crisis   [phone]  [place] National Suicide Hotline  [phone] Len Childs)  Call 911 or go to emergency room  [facility]  [phone]);  [place] and [employer]  [phone]); [place], [place], [place], [place], [place], [place], [place]    Please always bring your medication bottles  Come back to see me in 2-3 weeks   Carney Living, MD [facility]

## 2023-02-11 NOTE — Assessment & Plan Note (Signed)
Due to several important deaths. Trial of trazodone for sleep.  Close follow up in 1-2 weeks.  Gave list of counselors

## 2023-02-11 NOTE — Assessment & Plan Note (Signed)
Not at goal.  Will likely start blood pressure medication next visit if still high.    BP Readings from Last 3 Encounters:  02/11/23 (!) 167/88  12/30/22 (!) 154/80  10/09/22 (!) 161/89

## 2023-02-11 NOTE — Assessment & Plan Note (Signed)
Not at goal.  If not losing weight due to grief will increase Trulicity at next visit.  Feels she can't take more than one metformin tablet a day as causes her vision problems.

## 2023-02-11 NOTE — Patient Instructions (Addendum)
Good to see you today - Thank you for coming in  Things we discussed today:  Diabetes Come back in 2 weeks to see if you are losing weight If not we will increase your Trulicity Will check your blood pressure then    Grief Take trazodone 25-50 mg an hour before bedtime Go to bed and get up same time - Have a routine  Therapy and Counseling Resources Most providers on this list will take Medicaid. Patients with commercial insurance or Medicare should contact their insurance company to get a list of in network providers.  [facility] (takes children) Location 1: [address] [place], [place] [address] Location 2: [address] [place], [place] [address] [phone]   [facility] (spanish speaking therapist available)(habla espanol)(take medicare and medicaid)  [address], [place], [place] [address], [email or web address] [phone]  [facility]:Psychiatry and Counseling [address] [facility] [place], [place] [address] [phone]  [facility]   [address] [place], [place] [address]      [phone]  [facility] (spanish available) [address]  [place], [place] [address] [phone]  [facility] (take [facility] and medicare) [address]  [place], [place] [address]       [phone]     [facility] (virtual only) [phone]  [facility] [address] [place], [place] [phone]  [facility]:  [address] [place] [phone]  [facility]:  [address] [place] [phone]  [facility] (under & uninsured) [address]   [place] [phone]    [email or web address]    [facility] [address]  [place]    [phone]  [facility] -[address]     Phone:   [phone]     [facility]-  [address]  [phone]   [facility] [address]      [place], [place] [phone]  [facility]: [address] [place], [place], [place]  [phone]   [facility] (Spanish therapist) [email or web address]  [address] [place]  [address]   [place] [address]    [phone]    [facility]   [address],  [place], [place]  [phone]   [facility]  [address] [place]  [phone]  [facility]  [address] [place], [place]        [phone]  Open Access/Walk In Clinic under & uninsured  [facility]  [address] [place], [place] [phone] Crisis [phone]  [facility] [place],  (Spanish)   [address] [place], [place]: [phone]) 8:30 - 12; 1 - 2:30  [facility] [employer],  [address] [place], [place]    [phone]):8:30 - 12; 2 - 3PM  [facility] [employer],  [address],  [place]; [phone]):   Mon - Fri 8 AM - 5 PM  [facility] [address] [place]  MWF 12:30 to 3:00 or call to schedule an appointment  [phone]  Specific Provider options Psychology Today  https://www.[email or web address].com/us click on find a therapist  enter your zip code left side and select or tailor a therapist for your specific need.   [facility] Provider Directory  [email or web address]  (Medicaid)   Follow all drop down to find a provider  Social Support program Mental Health [place] [phone] or PhotoSolver.pl [address], [place], [place] Recovery support and educational   24- Hour Availability:   [facility]  [address] [place], [place] [phone] Crisis [phone]  [facility] [phone]  [place]  Crisis Service  [phone]   [facility]  [phone] (after hours)  Therapeutic Alternative/Mobile Crisis   [phone]  [place] National Suicide Hotline  [phone] Len Childs)  Call 911 or go to emergency room  [facility]  [phone]);  [place] and [employer]  [phone]); [place], [place], [place], [place], [place], [place], [place]    Please always bring your medication bottles  Come back to see me in 2-3 weeks

## 2023-02-21 ENCOUNTER — Other Ambulatory Visit: Payer: Self-pay | Admitting: Hematology and Oncology

## 2023-02-21 ENCOUNTER — Encounter: Payer: Self-pay | Admitting: Pharmacist

## 2023-02-24 ENCOUNTER — Telehealth: Payer: Self-pay | Admitting: Oncology

## 2023-02-24 ENCOUNTER — Other Ambulatory Visit: Payer: Self-pay | Admitting: Gynecologic Oncology

## 2023-02-24 ENCOUNTER — Other Ambulatory Visit: Payer: Self-pay | Admitting: Psychiatry

## 2023-02-24 DIAGNOSIS — C541 Malignant neoplasm of endometrium: Secondary | ICD-10-CM

## 2023-02-24 MED ORDER — LETROZOLE 2.5 MG PO TABS: 2.5000 mg | ORAL_TABLET | Freq: Every day | ORAL | 3 refills | Status: DC

## 2023-02-24 NOTE — Telephone Encounter (Signed)
Called Antavia and let her know a refill for letrozole has been sent to [facility] on [address].  She verbalized understanding and agreement.

## 2023-02-25 ENCOUNTER — Ambulatory Visit: Payer: Medicaid Other | Admitting: Family Medicine

## 2023-02-25 ENCOUNTER — Encounter: Payer: Self-pay | Admitting: Family Medicine

## 2023-02-25 ENCOUNTER — Other Ambulatory Visit: Payer: Self-pay

## 2023-02-25 VITALS — BP 182/85 | HR 78 | Ht 64.0 in | Wt 391.0 lb

## 2023-02-25 DIAGNOSIS — F4321 Adjustment disorder with depressed mood: Secondary | ICD-10-CM | POA: Diagnosis not present

## 2023-02-25 DIAGNOSIS — J4520 Mild intermittent asthma, uncomplicated: Secondary | ICD-10-CM

## 2023-02-25 DIAGNOSIS — E119 Type 2 diabetes mellitus without complications: Secondary | ICD-10-CM | POA: Diagnosis not present

## 2023-02-25 DIAGNOSIS — I10 Essential (primary) hypertension: Secondary | ICD-10-CM

## 2023-02-25 MED ORDER — TRULICITY 4.5 MG/0.5ML ~~LOC~~ SOAJ: 4.5000 mg | SUBCUTANEOUS | 3 refills | Status: DC

## 2023-02-25 MED ORDER — OLMESARTAN MEDOXOMIL 5 MG PO TABS: 5.0000 mg | ORAL_TABLET | Freq: Every day | ORAL | 2 refills | Status: DC

## 2023-02-25 NOTE — Assessment & Plan Note (Addendum)
Given persistent readings will start low dose Benicar.  Given low dose and her tolerance of ACEI in past do not feel need to check bmet.

## 2023-02-25 NOTE — Assessment & Plan Note (Signed)
Seems asymptomatic.  Will monitor for any recurrence of symptoms

## 2023-02-25 NOTE — Assessment & Plan Note (Signed)
Improved.  Wean nightly trazadone.

## 2023-02-25 NOTE — Progress Notes (Signed)
    SUBJECTIVE:   CHIEF COMPLAINT / HPI:   Diabetes Taking 3 mg trulicity regularly and without side effects.  Taking metformin one daily.  Would like to go up on trulicity.    Grief Overall feeling much better.  Talking to her minister regularly.  Back living alone but is in contact with her mother.   Taking trazodone 25 mg which helps.  Plans to wean this down    Smoking 5 cigarettes plans to stop Uses albuterol rarely.  Does not feel she has any problems breathing  PERTINENT  PMH / PSH:   COPD - Dr Aldean Ast Pulmonary Endometrial Cancer - Dr Marylene Land Onc   OBJECTIVE:   BP (!) 182/85   Pulse 78   Ht 5\' 4"  (1.626 m)   Wt (!) 391 lb (177.4 kg)   SpO2 100%   BMI 67.11 kg/m   Heart - Regular rate and rhythm.  No murmurs, gallops or rubs.    Lungs:  Normal respiratory effort, chest expands symmetrically. Lungs are clear to auscultation, no crackles or wheezes.   ASSESSMENT/PLAN:   Primary hypertension Assessment & Plan: Given persistent readings will start low dose Benicar.  Given low dose and her tolerance of ACEI in past do not feel need to check bmet.  Orders: -     Olmesartan Medoxomil; Take 1 tablet (5 mg total) by mouth at bedtime.  Dispense: 30 tablet; Refill: 2  Mild intermittent asthma, unspecified whether complicated Assessment & Plan: Seems asymptomatic.  Will monitor for any recurrence of symptoms   Type 2 diabetes mellitus without complication, without long-term current use of insulin (HCC) Assessment & Plan: Not at goal.  Increase trulicity to 4.5 mg weekly   Grief Assessment & Plan: Improved.  Wean nightly trazadone.      Other orders -     Trulicity; Inject 4.5 mg as directed once a week.  Dispense: 8 mL; Refill: 3     Patient Instructions  Good to see you today - Thank you for coming in  Things we discussed today:  Diabetes Go up on trulicity in 2 weeks to 4.5 mg   Hypertension  Take benicar - 5 mg once a day  Please always  bring your medication bottles  Come back to see me in 2 months   Carney Living, MD [facility]

## 2023-02-25 NOTE — Assessment & Plan Note (Signed)
Not at goal.  Increase trulicity to 4.5 mg weekly

## 2023-02-25 NOTE — Patient Instructions (Addendum)
Good to see you today - Thank you for coming in  Things we discussed today:  Diabetes Go up on trulicity in 2 weeks to 4.5 mg   Hypertension  Take benicar - 5 mg once a day  Please always bring your medication bottles  Come back to see me in 2 months

## 2023-03-17 ENCOUNTER — Other Ambulatory Visit: Payer: Self-pay | Admitting: Family Medicine

## 2023-03-17 DIAGNOSIS — F4321 Adjustment disorder with depressed mood: Secondary | ICD-10-CM

## 2023-03-24 ENCOUNTER — Telehealth: Payer: Self-pay | Admitting: Pharmacist

## 2023-03-24 NOTE — Telephone Encounter (Signed)
Patient contacted for enrollment in LIBERATE study, made appointment 10/17 at 2:30 for potential CGM start.   Total time with patient call and documentation of interaction: 8 minutes.

## 2023-03-25 NOTE — Telephone Encounter (Signed)
Reviewed and agree with Dr Koval's plan.   

## 2023-03-27 ENCOUNTER — Ambulatory Visit: Payer: Medicaid Other | Admitting: Pharmacist

## 2023-04-01 ENCOUNTER — Other Ambulatory Visit: Payer: Self-pay | Admitting: *Deleted

## 2023-04-01 DIAGNOSIS — R0609 Other forms of dyspnea: Secondary | ICD-10-CM

## 2023-04-02 ENCOUNTER — Ambulatory Visit (INDEPENDENT_AMBULATORY_CARE_PROVIDER_SITE_OTHER): Payer: Medicaid Other | Admitting: Pulmonary Disease

## 2023-04-02 ENCOUNTER — Ambulatory Visit: Payer: Medicaid Other | Admitting: Pulmonary Disease

## 2023-04-02 ENCOUNTER — Encounter: Payer: Self-pay | Admitting: Pulmonary Disease

## 2023-04-02 VITALS — BP 153/103 | HR 64 | Ht 64.0 in | Wt 381.1 lb

## 2023-04-02 DIAGNOSIS — R0609 Other forms of dyspnea: Secondary | ICD-10-CM

## 2023-04-02 NOTE — Progress Notes (Signed)
Deborah Benson    [id number]    Apr 18, 1961  Primary Care Physician:Chambliss, Estill Batten, MD  Referring Physician: Carney Living, MD [address] [place],  [place] [address]  Chief complaint:   Follow-up for shortness of breath  HPI:  Breathing has been much better since her last visit She has managed to lose a good amount of weight She is trying to get more active  Had surgery in December 2023 for endometrial cancer, ended up staying in the hospital for about 4 days Required BiPAP post operatively for hypoxemic/hypercapnic respiratory failure -Not currently on any devices  Breathing is much better Not short of breath with usual activities  No fevers no chills, no active cough Feels she sleeps better without weight loss  Usually goes to bed about 9 PM Takes about 30 to 40 minutes to fall asleep 3-4 awakenings Final wake up time about 10 AM  Good energy levels  Dad snored She is down to about 4 cigarettes a day and working on quitting  She was recently started on Trulicity which is held at present following her surgery  She does have a history of diabetes, chronic anemia  She does have some wheezing and sputum production  Outpatient Encounter Medications as of 04/02/2023  Medication Sig   albuterol (PROVENTIL) (2.5 MG/3ML) 0.083% nebulizer solution Take 3 mLs (2.5 mg total) by nebulization every 6 (six) hours as needed for wheezing or shortness of breath.   cholecalciferol (VITAMIN D3) 25 MCG (1000 UNIT) tablet Take 1,000 Units by mouth daily.   Dulaglutide (TRULICITY) 4.5 MG/0.5ML SOPN Inject 4.5 mg as directed once a week.   gabapentin (NEURONTIN) 100 MG capsule Take 1 capsule (100 mg total) by mouth 3 (three) times daily.   letrozole (FEMARA) 2.5 MG tablet Take 1 tablet (2.5 mg total) by mouth daily.   metFORMIN (GLUCOPHAGE-XR) 500 MG 24 hr tablet TAKE 1 TABLET(500 MG) BY MOUTH IN THE MORNING AND AT BEDTIME (Patient taking differently:  500 mg daily with breakfast.)   nystatin (MYCOSTATIN/NYSTOP) powder Apply 1 Application topically 3 (three) times daily as needed.   olmesartan (BENICAR) 5 MG tablet Take 1 tablet (5 mg total) by mouth at bedtime.   traZODone (DESYREL) 50 MG tablet TAKE 1/2 TO 1 TABLET(25 TO 50 MG) BY MOUTH AT BEDTIME AS NEEDED FOR SLEEP   No facility-administered encounter medications on file as of 04/02/2023.    Allergies as of 04/02/2023 - Review Complete 04/02/2023  Allergen Reaction Noted   Penicillins Hives 10/01/2010    Past Medical History:  Diagnosis Date   Arthritis    Asthma    Cancer (HCC)    ovarian cancer   Depression    Diabetes mellitus without complication (HCC)    oral meds only   Gout    HTN (hypertension)    "borderline" med was stopped by pt- MD aware.   Neuromuscular disorder (HCC)    neuropathy -hands/ feet   Neuropathy    Obesity    BMI >76.6   Pneumonia     Past Surgical History:  Procedure Laterality Date   FOOT SURGERY     due to needle break in foot   HYSTEROSCOPY WITH D & C N/A 06/04/2022   Procedure: DILATATION AND CURETTAGE;  Surgeon: Clide Cliff, MD;  Location: [facility];  Service: Gynecology;  Laterality: N/A;   INTRAUTERINE DEVICE (IUD) INSERTION N/A 06/04/2022   Procedure: MIRENA INTRAUTERINE DEVICE (IUD) INSERTION;  Surgeon: Alvester Morin,  Sharyl Nimrod, MD;  Location: [facility];  Service: Gynecology;  Laterality: N/A;   MULTIPLE TOOTH EXTRACTIONS     TONSILLECTOMY      Family History  Problem Relation Age of Onset   Heart attack Mother    Stroke Mother    Gout Mother    Hypertension Mother    Lung cancer Father 85 - 71       smoked   Glaucoma Father    Heart disease Sister        fluid    Other Sister        spinal disease   Throat cancer Sister    Diabetes Brother    Prostate cancer Maternal Uncle    Ovarian cancer Maternal Grandmother    Dementia Maternal Grandfather    Glaucoma Other        nephew   Seizures Other        niece   Colon  cancer Neg Hx    Breast cancer Neg Hx    Endometrial cancer Neg Hx    Pancreatic cancer Neg Hx     Social History   Socioeconomic History   Marital status: Single    Spouse name: Not on file   Number of children: 0   Years of education: Not on file   Highest education level: GED or equivalent  Occupational History   Occupation: disabled  Tobacco Use   Smoking status: Every Day    Current packs/day: 0.40    Average packs/day: 0.4 packs/day for 44.8 years (17.9 ttl pk-yrs)    Types: Cigarettes    Start date: 06/10/1978   Smokeless tobacco: Never   Tobacco comments:    down to 5 per day - Previous 7-10  Vaping Use   Vaping status: Never Used  Substance and Sexual Activity   Alcohol use: No    Alcohol/week: 0.0 standard drinks of alcohol   Drug use: No   Sexual activity: Not Currently  Other Topics Concern   Not on file  Social History Narrative         Past Medical History:      Irregular menstrual periods      Asthma         Social History:      Disabled?,  Weight and OA; Moved to [place] 6 yrs ago.  Single         Social Determinants of Health   Financial Resource Strain: Low Risk  (02/23/2023)   Overall Financial Resource Strain (CARDIA)    Difficulty of Paying Living Expenses: Not very hard  Food Insecurity: Food Insecurity Present (02/23/2023)   Hunger Vital Sign    Worried About Running Out of Food in the Last Year: Sometimes true    Ran Out of Food in the Last Year: Patient declined  Transportation Needs: No Transportation Needs (02/23/2023)   PRAPARE - Administrator, Civil Service (Medical): No    Lack of Transportation (Non-Medical): No  Physical Activity: Unknown (02/23/2023)   Exercise Vital Sign    Days of Exercise per Week: Patient declined    Minutes of Exercise per Session: Not on file  Stress: No Stress Concern Present (02/23/2023)   [employer] of Occupational Health - Occupational Stress Questionnaire    Feeling of Stress : Only  a little  Social Connections: Unknown (02/23/2023)   Social Connection and Isolation Panel [NHANES]    Frequency of Communication with Friends and Family: More than three times a week  Frequency of Social Gatherings with Friends and Family: More than three times a week    Attends Religious Services: More than 4 times per year    Active Member of [employer] or Organizations: Yes    Attends Banker Meetings: More than 4 times per year    Marital Status: Patient declined  Intimate Partner Violence: Unknown (11/03/2022)   Received from [employer], [facility]   HITS    Physically Hurt: Not on file    Insult or Talk Down To: Not on file    Threaten Physical Harm: Not on file    Scream or Curse: Not on file    Review of Systems  Respiratory:  Positive for cough and shortness of breath. Negative for wheezing.   Psychiatric/Behavioral:  Positive for sleep disturbance.     Vitals:   04/02/23 1549  BP: (!) 153/103  Pulse: 64  SpO2: 94%     Physical Exam Constitutional:      Appearance: She is obese.  HENT:     Head: Normocephalic.     Nose: Nose normal.     Mouth/Throat:     Mouth: Mucous membranes are moist.     Comments: Mallampati 3, crowded oropharynx, macroglossia Eyes:     General: No scleral icterus.    Pupils: Pupils are equal, round, and reactive to light.  Cardiovascular:     Rate and Rhythm: Normal rate and regular rhythm.     Heart sounds: No murmur heard.    No friction rub.  Pulmonary:     Effort: No respiratory distress.     Breath sounds: No stridor. No wheezing or rhonchi.  Musculoskeletal:     Cervical back: No rigidity or tenderness.  Neurological:     Mental Status: She is alert.  Psychiatric:        Mood and Affect: Mood normal.       07/09/2022    3:00 PM  Results of the Epworth flowsheet  Sitting and reading 2  Watching TV 2  Sitting, inactive in a public place (e.g. a theatre or a meeting) 0  As a passenger in a car for an  hour without a break 1  Lying down to rest in the afternoon when circumstances permit 3  Sitting and talking to someone 0  Sitting quietly after a lunch without alcohol 1  In a car, while stopped for a few minutes in traffic 0  Total score 9    Data Reviewed: Pulmonary function test with no significant obstruction, moderate restriction, normal diffusing capacity, markedly reduced ERV  Assessment:  History of hypercapnic respiratory failure -Functioning well at present  Significant weight loss since last visit  Concern for obesity hypoventilation -Managing to lose significant weight  Active smoker -Continue to try to cut down  Plan/Recommendations: Smoking cessation counseling  Continue weight loss efforts  Encouraged to maintain graded activities as tolerated  Follow-up in about 6 months  Call with significant concerns  Virl Diamond MD [facility] 04/02/2023, 3:58 PM  CC: Carney Living, *

## 2023-04-02 NOTE — Progress Notes (Signed)
Full PFT performed today. °

## 2023-04-02 NOTE — Patient Instructions (Signed)
I will see you about 6 months from here  Continue graded activities as tolerated  Continue weight loss efforts  Continue working on quitting smoking  Call us with significant concerns  Your breathing study looks fine today there is some restriction on your lungs, your ongoing weight loss will continue to help this

## 2023-04-02 NOTE — Patient Instructions (Signed)
Full PFT performed today. °

## 2023-04-03 LAB — PULMONARY FUNCTION TEST
DL/VA % pred: 114 %
DL/VA: 4.8 ml/min/mmHg/L
DLCO cor % pred: 82 %
DLCO cor: 16.64 ml/min/mmHg
DLCO unc % pred: 82 %
DLCO unc: 16.64 ml/min/mmHg
FEF 25-75 Post: 1.3 L/s
FEF 25-75 Pre: 1.21 L/s
FEF2575-%Change-Post: 7 %
FEF2575-%Pred-Post: 56 %
FEF2575-%Pred-Pre: 52 %
FEV1-%Change-Post: 1 %
FEV1-%Pred-Post: 58 %
FEV1-%Pred-Pre: 57 %
FEV1-Post: 1.48 L
FEV1-Pre: 1.45 L
FEV1FVC-%Change-Post: 5 %
FEV1FVC-%Pred-Pre: 98 %
FEV6-%Change-Post: -3 %
FEV6-%Pred-Post: 57 %
FEV6-%Pred-Pre: 59 %
FEV6-Post: 1.83 L
FEV6-Pre: 1.9 L
FEV6FVC-%Pred-Post: 103 %
FEV6FVC-%Pred-Pre: 103 %
FVC-%Change-Post: -3 %
FVC-%Pred-Post: 55 %
FVC-%Pred-Pre: 57 %
FVC-Post: 1.83 L
FVC-Pre: 1.9 L
Post FEV1/FVC ratio: 81 %
Post FEV6/FVC ratio: 100 %
Pre FEV1/FVC ratio: 77 %
Pre FEV6/FVC Ratio: 100 %
RV % pred: 80 %
RV: 1.62 L
TLC % pred: 70 %
TLC: 3.55 L

## 2023-04-07 ENCOUNTER — Inpatient Hospital Stay (HOSPITAL_BASED_OUTPATIENT_CLINIC_OR_DEPARTMENT_OTHER): Payer: Medicaid Other | Admitting: Psychiatry

## 2023-04-07 ENCOUNTER — Telehealth: Payer: Self-pay | Admitting: *Deleted

## 2023-04-07 DIAGNOSIS — C541 Malignant neoplasm of endometrium: Secondary | ICD-10-CM | POA: Diagnosis not present

## 2023-04-07 NOTE — Telephone Encounter (Signed)
Spoke with Ms. Deborah Benson who called the office to reschedule her appointment today with Dr. Alvester Morin for another day due to family emergency. Pt was given Monday, November 4 th at 3pm with Dr. Alvester Morin. Pt agreed to date and time and had no further concerns.

## 2023-04-07 NOTE — Progress Notes (Unsigned)
This encounter was created in error - please disregard.

## 2023-04-14 ENCOUNTER — Inpatient Hospital Stay: Payer: Medicaid Other | Attending: Psychiatry | Admitting: Psychiatry

## 2023-04-14 VITALS — BP 154/68 | HR 65 | Temp 97.6°F | Resp 20 | Wt 389.4 lb

## 2023-04-14 DIAGNOSIS — F17210 Nicotine dependence, cigarettes, uncomplicated: Secondary | ICD-10-CM | POA: Insufficient documentation

## 2023-04-14 DIAGNOSIS — E041 Nontoxic single thyroid nodule: Secondary | ICD-10-CM | POA: Diagnosis not present

## 2023-04-14 DIAGNOSIS — J4541 Moderate persistent asthma with (acute) exacerbation: Secondary | ICD-10-CM

## 2023-04-14 DIAGNOSIS — Z975 Presence of (intrauterine) contraceptive device: Secondary | ICD-10-CM | POA: Insufficient documentation

## 2023-04-14 DIAGNOSIS — C541 Malignant neoplasm of endometrium: Secondary | ICD-10-CM | POA: Diagnosis present

## 2023-04-14 DIAGNOSIS — Z6841 Body Mass Index (BMI) 40.0 and over, adult: Secondary | ICD-10-CM | POA: Insufficient documentation

## 2023-04-14 DIAGNOSIS — E119 Type 2 diabetes mellitus without complications: Secondary | ICD-10-CM | POA: Insufficient documentation

## 2023-04-14 DIAGNOSIS — Z79811 Long term (current) use of aromatase inhibitors: Secondary | ICD-10-CM | POA: Insufficient documentation

## 2023-04-14 DIAGNOSIS — J45909 Unspecified asthma, uncomplicated: Secondary | ICD-10-CM | POA: Insufficient documentation

## 2023-04-14 DIAGNOSIS — R911 Solitary pulmonary nodule: Secondary | ICD-10-CM | POA: Insufficient documentation

## 2023-04-14 DIAGNOSIS — E042 Nontoxic multinodular goiter: Secondary | ICD-10-CM | POA: Insufficient documentation

## 2023-04-14 DIAGNOSIS — Z7985 Long-term (current) use of injectable non-insulin antidiabetic drugs: Secondary | ICD-10-CM | POA: Insufficient documentation

## 2023-04-14 NOTE — Progress Notes (Unsigned)
Gynecologic Oncology Return Clinic Visit  Date of Service: 04/14/2023 Referring Provider: Valentina Shaggy, MD   Assessment & Plan: Deborah Benson is a 62 y.o. 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  Endometrial cancer: - Pt not previously a surgical candidate due to obesity and pulmonary status. Pt was admitted to the hospital for 5 days after her D&C due to hypoxia. Was previously unable to lay flat which limited her option for radiation treatment, however, now able to lay flat and overall improving respiratory status. - Continue at this time with IUD and letrozole. -Last CT scan in June 2024.  Plan repeat in 6 months.  Scheduled for December 2024. - EMB collected today to assess treatment status. - If inadequate treatment with current regimen, given improvement in respiratory status and weight loss, could reconsider definitive radiation treatment versus attempt at robotic hysterectomy. - Germline genetic testing negative. - Plan return visit in 3 months.   Morbid obesity: -Patient has been working on lifestyle changes to help with weight loss and has been on Trulicity.  has lost over 80 pounds. - Patient congratulated on these changes. -Continue to monitor.  Reviewed goal with patient of getting closer to 300 pounds prior to reconsidering hysterectomy as a definitive intervention for her endometrial cancer. Wt Readings from Last 5 Encounters:  04/14/23 (!) 389 lb 6.4 oz (176.6 kg)  04/02/23 (!) 381 lb 1.6 oz (172.9 kg)  02/25/23 (!) 391 lb (177.4 kg)  02/11/23 (!) 393 lb 3.2 oz (178.4 kg)  12/30/22 (!) 394 lb 6.4 oz (178.9 kg)    Asthma, improving: -Last seen by pulmonology on 04/02/2023. - Follow-up in 6 months  Thyroid nodules, mediastinal nodule: -Unchanged mediastinal nodule at thoracic inlet on CT scan. - Per CT report,  differential includes thyroid nodule, ectopic thyroid tissue, or thymoma. - Patient has referral in with endocrinology.  Had appointment in August 2024 but did not present.  Patient encouraged to reschedule.   RTC 3 months.  Clide Cliff, MD Gynecologic Oncology   Medical Decision Making I personally spent  TOTAL 35 minutes face-to-face and non-face-to-face in the care of this patient, which includes all pre, intra, and post visit time on the date of service.  ----------------------- Reason for Visit: Surveillance  Treatment History: Oncology History Overview Note  MSI stable, ER 95%, PR 100% Genetics neg   Uterine cancer (HCC)  04/17/2022 Imaging   US pelvis 1. Prominent abnormal endometrial thickening (41 mm). In the setting of post-menopausal bleeding, endometrial sampling is indicated to exclude carcinoma. If results are benign, sonohysterogram should be considered for focal lesion work-up.  2. Nonvisualization of the ovaries. No adnexal masses.   06/04/2022 Pathology Results   A. 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  See comment.    07/17/2022 Imaging   MRI pelvis 1. Maximal thickness of the endometrial canal approaches 2.5 cm, compared to gross thickening up to 4 cm on the previous exam. Presence is suspicious for residual tumor in the endometrium but shows improvement since previous imaging. Exam quality is compromised by patient's body habitus. Depth of myometrial invasion is difficult to assess on the current study but there is certainly no gross invasion beyond 50%. If additional MRI follow-up is desired direct involvement from a radiologist for protocol prescription may be helpful to mitigate  artifacts and maximize image quality. 2. IUD in place 3. 11 mm RIGHT pelvic sidewall lymph node other smaller lymph nodes in the pelvis.  This finding is nonspecific but suspicious in light of gynecologic neoplasm. PET imaging could be considered for further evaluation.   07/22/2022 Initial Diagnosis   Uterine cancer (HCC)   08/21/2022 Imaging   Lack of intravenous contrast administration and photon attenuation related to patient habitus limit evaluation of the thoracic inlet and pelvis. Within this context:   1. IUD in place in the uterus. Pelvic structures not well evaluated on this examination. 2. Pelvic sidewall lymph node seen on prior MRI is not identified on this examination, likely secondary to artifact from photon attenuation. 3. Enlarged periportal lymph nodes measure up to 16 mm in short axis, nonspecific and commonly reactive. However metastatic nodal disease while not favored is not excluded. Consider attention on follow-up imaging. 4. Lobular enlargement of the thyroid gland measuring up to 2.7 cm, suggest further evaluation with dedicated thyroid ultrasound. 5. Soft tissue nodule anterior to the great vessels and subjacent to the thyroid gland measuring 2.7 x 1.8 cm, is nonspecific possibly reflecting an enlarged lymph node or parathyroid adenoma. Consider further evaluation with contrast enhanced CT of the neck. 6. Stranding in the anterior abdominal wall, nonspecific but possibly reflecting cellulitis. 7.  Aortic Atherosclerosis (ICD10-I70.0).   08/21/2022 Cancer Staging   Staging form: Corpus Uteri - Carcinoma and Carcinosarcoma, AJCC 8th Edition - Clinical stage from 08/21/2022: Stage IIIC2 (cT1, cN2a, cM0) - Signed by Artis Delay, MD on 08/21/2022 Stage prefix: Initial diagnosis    Genetic Testing   [facility] CancerNext-Expanded Panel+RNA was Negative. Report date is 08/23/2022.  The CancerNext-Expanded gene panel offered by [facility] and includes sequencing, rearrangement, and RNA analysis for the following 71 genes: AIP, ALK, APC, ATM, AXIN2, BAP1, BARD1, BMPR1A, BRCA1, BRCA2, BRIP1, CDC73, CDH1, CDK4, CDKN1B,  CDKN2A, CHEK2, CTNNA1, DICER1, FH, FLCN, KIF1B, LZTR1, MAX, MEN1, MET, MLH1, MSH2, MSH3, MSH6, MUTYH, NF1, NF2, NTHL1, PALB2, PHOX2B, PMS2, POT1, PRKAR1A, PTCH1, PTEN, RAD51C, RAD51D, RB1, RET, SDHA, SDHAF2, SDHB, SDHC, SDHD, SMAD4, SMARCA4, SMARCB1, SMARCE1, STK11, SUFU, TMEM127, TP53, TSC1, TSC2, and VHL (sequencing and deletion/duplication); EGFR, EGLN1, HOXB13, KIT, MITF, PDGFRA, POLD1, and POLE (sequencing only); EPCAM and GREM1 (deletion/duplication only).    08/23/2022 -  Anti-estrogen oral therapy   She is started on Femara   08/29/2022 Imaging   1. Thyromegaly with findings suggestive of multinodular goiter. 2. Nodules labeled #1 and #4 both meet imaging criteria to recommend percutaneous sampling as indicated. 3. Nodule #3 meets imaging criteria to recommend a 1 year follow-up.  4. Note, the substernal nodule demonstrated on recent chest CT was not visualized sonographically due to substernal location. Further evaluation could be performed with nuclear medicine thyroid uptake and scan (to evaluate if this substernal tissue represents exophytic or ectopic thyroid parenchyma) versus contrast-enhanced neck CT as indicated.   11/25/2022 Tumor Marker   Patient's tumor was tested for the following markers: CA-125. Results of the tumor marker test revealed 9.2.   11/25/2022 Imaging   CT CHEST ABDOMEN PELVIS W CONTRAST  Result Date: 11/22/2022 CLINICAL DATA:  Cervical cancer, assess treatment response, ongoing oral chemotherapy * Tracking Code: BO * EXAM: CT CHEST, ABDOMEN, AND PELVIS WITH CONTRAST TECHNIQUE: Multidetector CT imaging of the chest, abdomen and pelvis was performed following the standard protocol during bolus administration of intravenous contrast. RADIATION DOSE REDUCTION: This exam was performed according to the departmental dose-optimization program which includes automated exposure  control, adjustment of the mA and/or kV according to patient size and/or use of iterative  reconstruction technique. CONTRAST:  OMNIPAQUE IOHEXOL 300 MG/ML SOLN additional oral enteric contrast COMPARISON:  CT chest abdomen pelvis, 08/19/2022, thyroid ultrasound, 08/29/2022 FINDINGS: Examination is significantly limited by patient body habitus and related photopenia. CT CHEST FINDINGS Cardiovascular: No significant vascular findings. Mild cardiomegaly. No pericardial effusion. Mediastinum/Nodes: Unchanged anterior mediastinal nodule just below the thoracic inlet measuring 2.9 x 1.5 cm (series 2, image 16). No enlarged mediastinal, hilar, or axillary lymph nodes. Heterogeneously enlarged thyroid gland, previously assessed by dedicated thyroid ultrasound. This has been evaluated on previous imaging. (ref: J Am Coll Radiol. 2015 Feb;12(2): 143-50). Trachea, and esophagus demonstrate no significant findings. Lungs/Pleura: Unchanged bandlike scarring or atelectasis throughout the lungs. No pleural effusion or pneumothorax. Musculoskeletal: No chest wall abnormality. No acute osseous findings. CT ABDOMEN PELVIS FINDINGS Hepatobiliary: Hepatomegaly, maximum coronal span 20.3 cm. No solid liver abnormality is seen. No gallstones, gallbladder wall thickening, or biliary dilatation. Pancreas: Unremarkable. No pancreatic ductal dilatation or surrounding inflammatory changes. Spleen: Normal in size without significant abnormality. Adrenals/Urinary Tract: Adrenal glands are unremarkable. Kidneys are normal, without renal calculi, solid lesion, or hydronephrosis. Bladder is unremarkable. Stomach/Bowel: Stomach is within normal limits. Appendix appears normal. No evidence of bowel wall thickening, distention, or inflammatory changes. Vascular/Lymphatic: Aortic atherosclerosis. Unchanged enlarged portacaval lymph node measuring up to 2.8 x 1.6 cm (series 2, image 67). Unchanged prominent inguinal lymph nodes (series 2, image 115). Reproductive: IUD present in the fundal endometrial cavity (series 2, image 106).  Evaluation of the pelvic structures very limited by body habitus and photopenia; no obvious cervical mass appreciated. Other: No abdominal wall hernia or abnormality. No ascites. Musculoskeletal: No acute osseous findings. IMPRESSION: 1. Examination is significantly limited by patient body habitus and related photopenia. 2. Evaluation of the pelvic structures particular is very limited; no obvious cervical mass appreciated. 3. Unchanged enlarged portacaval lymph node measuring up to 2.8 x 1.6 cm, as previously reported, nonspecific and most likely reactive. Unchanged prominent inguinal lymph nodes. No specific evidence of nodal metastatic disease. Continued attention on follow-up. 4. Unchanged anterior mediastinal nodule just below the thoracic inlet measuring 2.9 x 1.5 cm. This was not visualized on recent prior thyroid ultrasound, but nonetheless may reflect a substernal thyroid nodule or ectopic thyroid tissue. Alternately this could reflect an anterior mediastinal mass such as a thymoma. At either rate, this does not likely reflect metastatic disease cervical cancer. Contrast enhanced MR of the chest or nuclear thyroid uptake scan may be helpful to further assess. 5. Hepatomegaly. Aortic Atherosclerosis (ICD10-I70.0). Electronically Signed   By: Jearld Lesch M.D.   On: 11/22/2022 22:31        Interval History: Patient reports that she is overall doing well.  Does note some itching/numbness with tingling and burning on her left lateral thigh which has been occurring for the past 3 to 4 weeks.  She otherwise denies any new vaginal bleeding, abdominal/pelvic pain, change in bowel or bladder habits, bloating, nausea/vomiting.   Patient has been on Trulicity and reports an 80 pound weight loss.  She also reports that her pulmonologist feels that she is doing well and plans follow-up in 6 months.  She has not been needing her albuterol inhaler lately.  Continues to smoke cigarettes but reports being down to  about 5 cigarettes a day.   Past Medical/Surgical History: Past Medical History:  Diagnosis Date   Arthritis    Asthma  Cancer (HCC)    ovarian cancer   Depression    Diabetes mellitus without complication (HCC)    oral meds only   Gout    HTN (hypertension)    "borderline" med was stopped by pt- MD aware.   Neuromuscular disorder (HCC)    neuropathy -hands/ feet   Neuropathy    Obesity    BMI >76.6   Pneumonia     Past Surgical History:  Procedure Laterality Date   FOOT SURGERY     due to needle break in foot   HYSTEROSCOPY WITH D & C N/A 06/04/2022   Procedure: DILATATION AND CURETTAGE;  Surgeon: Clide Cliff, MD;  Location: [facility];  Service: Gynecology;  Laterality: N/A;   INTRAUTERINE DEVICE (IUD) INSERTION N/A 06/04/2022   Procedure: MIRENA INTRAUTERINE DEVICE (IUD) INSERTION;  Surgeon: Clide Cliff, MD;  Location: [facility];  Service: Gynecology;  Laterality: N/A;   MULTIPLE TOOTH EXTRACTIONS     TONSILLECTOMY      Family History  Problem Relation Age of Onset   Heart attack Mother    Stroke Mother    Gout Mother    Hypertension Mother    Lung cancer Father 33 - 52       smoked   Glaucoma Father    Heart disease Sister        fluid    Other Sister        spinal disease   Throat cancer Sister    Diabetes Brother    Prostate cancer Maternal Uncle    Ovarian cancer Maternal Grandmother    Dementia Maternal Grandfather    Glaucoma Other        nephew   Seizures Other        niece   Colon cancer Neg Hx    Breast cancer Neg Hx    Endometrial cancer Neg Hx    Pancreatic cancer Neg Hx     Social History   Socioeconomic History   Marital status: Single    Spouse name: Not on file   Number of children: 0   Years of education: Not on file   Highest education level: GED or equivalent  Occupational History   Occupation: disabled  Tobacco Use   Smoking status: Every Day    Current packs/day: 0.40    Average packs/day: 0.4 packs/day for 44.8  years (17.9 ttl pk-yrs)    Types: Cigarettes    Start date: 06/10/1978   Smokeless tobacco: Never   Tobacco comments:    down to 5 per day - Previous 7-10  Vaping Use   Vaping status: Never Used  Substance and Sexual Activity   Alcohol use: No    Alcohol/week: 0.0 standard drinks of alcohol   Drug use: No   Sexual activity: Not Currently  Other Topics Concern   Not on file  Social History Narrative         Past Medical History:      Irregular menstrual periods      Asthma         Social History:      Disabled?,  Weight and OA; Moved to [place] 6 yrs ago.  Single         Social Determinants of Health   Financial Resource Strain: Low Risk  (02/23/2023)   Overall Financial Resource Strain (CARDIA)    Difficulty of Paying Living Expenses: Not very hard  Food Insecurity: Food Insecurity Present (02/23/2023)   Hunger Vital Sign    Worried  About Running Out of Food in the Last Year: Sometimes true    Ran Out of Food in the Last Year: Patient declined  Transportation Needs: No Transportation Needs (02/23/2023)   PRAPARE - Administrator, Civil Service (Medical): No    Lack of Transportation (Non-Medical): No  Physical Activity: Unknown (02/23/2023)   Exercise Vital Sign    Days of Exercise per Week: Patient declined    Minutes of Exercise per Session: Not on file  Stress: No Stress Concern Present (02/23/2023)   [employer] of Occupational Health - Occupational Stress Questionnaire    Feeling of Stress : Only a little  Social Connections: Unknown (02/23/2023)   Social Connection and Isolation Panel [NHANES]    Frequency of Communication with Friends and Family: More than three times a week    Frequency of Social Gatherings with Friends and Family: More than three times a week    Attends Religious Services: More than 4 times per year    Active Member of [employer] or Organizations: Yes    Attends Engineer, structural: More than 4 times per year    Marital  Status: Patient declined    Current Medications:  Current Outpatient Medications:    albuterol (PROVENTIL) (2.5 MG/3ML) 0.083% nebulizer solution, Take 3 mLs (2.5 mg total) by nebulization every 6 (six) hours as needed for wheezing or shortness of breath., Disp: 150 mL, Rfl: 1   cholecalciferol (VITAMIN D3) 25 MCG (1000 UNIT) tablet, Take 1,000 Units by mouth daily., Disp: , Rfl:    Dulaglutide (TRULICITY) 4.5 MG/0.5ML SOPN, Inject 4.5 mg as directed once a week., Disp: 8 mL, Rfl: 3   gabapentin (NEURONTIN) 100 MG capsule, Take 1 capsule (100 mg total) by mouth 3 (three) times daily., Disp: 90 capsule, Rfl: 0   letrozole (FEMARA) 2.5 MG tablet, Take 1 tablet (2.5 mg total) by mouth daily., Disp: 30 tablet, Rfl: 3   metFORMIN (GLUCOPHAGE-XR) 500 MG 24 hr tablet, TAKE 1 TABLET(500 MG) BY MOUTH IN THE MORNING AND AT BEDTIME (Patient taking differently: 500 mg daily with breakfast.), Disp: 60 tablet, Rfl: 0   nystatin (MYCOSTATIN/NYSTOP) powder, Apply 1 Application topically 3 (three) times daily as needed., Disp: 60 g, Rfl: 5   olmesartan (BENICAR) 5 MG tablet, Take 1 tablet (5 mg total) by mouth at bedtime., Disp: 30 tablet, Rfl: 2   traZODone (DESYREL) 50 MG tablet, TAKE 1/2 TO 1 TABLET(25 TO 50 MG) BY MOUTH AT BEDTIME AS NEEDED FOR SLEEP, Disp: 15 tablet, Rfl: 0  Review of Symptoms: Complete 10-system review is positive for: numbness  Physical Exam: BP (!) 154/68 (Patient Position: Sitting) Comment: 150/76 rechecked notified rn  Pulse 65   Temp 97.6 F (36.4 C) (Oral)   Resp 20   Wt (!) 389 lb 6.4 oz (176.6 kg)   SpO2 100%   BMI 66.84 kg/m  General: Alert, oriented, no acute distress. HEENT: Normocephalic, atraumatic. Sclera anicteric.  Chest: Normal work of breathing. CTAB Cardiovascular: Regular rate and rhythm, no murmurs. Abdomen: Soft, nontender.  Normoactive bowel sounds.  No masses appreciated.   Extremities: Grossly normal range of motion.  Warm, well perfused.  No edema  bilaterally. Skin: No rashes or lesions noted. Lymphatics: No cervical, supraclavicular, or inguinal adenopathy. GU: Normal appearing external genitalia without erythema, excoriation, or lesions.  Speculum exam reveals normal cervix with IUD strings visualized. Bimanual exam reveals normal cervix. Uterus and adnexa unable to be palpated due to body habitus. Exam chaperoned by Asher Muir  Jim Desanctis, RN   EMB Procedure: After appropriate verbal informed consent was obtained, a timeout was performed. A sterile speculum was placed in the vagina, and the area was cleaned with betadine x3. A single-tooth tenaculum was placed on the anterior lip of the cervix.  A cervical block was placed with 10 mL of 2% lidocaine at 4 and 8:00.  The os finder was used to dilate the cervix. An endometrial biopsy pipelle was advanced carefully to the uterine fundus which sounded to 10cm. An adequate sample was obtained over 1 pass. The tenaculum was removed, and tenaculum sites were noted to be hemostatic. The speculum was removed from the vagina. The patient tolerated the procedure well.   UPT: not indicated   Laboratory & Radiologic Studies: Surgical pathology (12/30/22): A. ENDOMETRIAL BIOPSY:  Focal complex atypical hyperplasia (EIN)  Background marked progestational effect   COMMENT:   Case is reviewed by Dr. Wenda Low who concurs with the interpretation.

## 2023-04-15 ENCOUNTER — Encounter: Payer: Self-pay | Admitting: Psychiatry

## 2023-04-16 LAB — SURGICAL PATHOLOGY

## 2023-05-01 ENCOUNTER — Ambulatory Visit: Payer: Medicaid Other | Admitting: Pharmacist

## 2023-05-01 ENCOUNTER — Encounter: Payer: Self-pay | Admitting: Pharmacist

## 2023-05-01 VITALS — BP 160/77 | HR 76 | Wt 384.6 lb

## 2023-05-01 DIAGNOSIS — F17200 Nicotine dependence, unspecified, uncomplicated: Secondary | ICD-10-CM

## 2023-05-01 DIAGNOSIS — E119 Type 2 diabetes mellitus without complications: Secondary | ICD-10-CM | POA: Diagnosis not present

## 2023-05-01 DIAGNOSIS — I10 Essential (primary) hypertension: Secondary | ICD-10-CM

## 2023-05-01 LAB — POCT GLYCOSYLATED HEMOGLOBIN (HGB A1C): HbA1c, POC (controlled diabetic range): 7.8 % — AB (ref 0.0–7.0)

## 2023-05-01 MED ORDER — VARENICLINE TARTRATE 0.5 MG PO TABS: 0.5000 mg | ORAL_TABLET | Freq: Two times a day (BID) | ORAL | 11 refills | Status: DC

## 2023-05-01 MED ORDER — OLMESARTAN MEDOXOMIL 5 MG PO TABS: 5.0000 mg | ORAL_TABLET | Freq: Every day | ORAL | 2 refills | Status: DC

## 2023-05-01 NOTE — Assessment & Plan Note (Signed)
Tobacco use disorder with moderate nicotine dependence of ~40 years duration in a patient who is good candidate for success because of self motivation to quit and self reduction from 10/day to 5/day without assistance. Discussed patients willingness to quit smoking, while continuing vaping for stress, with eventual goal of complete cessation.  -Initiated varenicline 0.5 mg by mouth once daily with food x7 days, then 0.5 mg by mouth twice daily with food thereafter. Patient counseled on purpose, proper use, and potential adverse effects, including GI upset.

## 2023-05-01 NOTE — Patient Instructions (Addendum)
Nice to see you today!   Medication Changes: RESTART olmesartan 5 mg once daily  START Chantix (varenicline) 0.5 mg once daily with food x 7 days, then 0.5 mg twice daily with food  Look into "statin" medications - Lipitor or Crestor    Continue all other medication the same.   Try pushing out your cigarette use after meals, not smoking right after eating  Tobacco Patient Instructions  Quitting smoking is one of the most important decisions you can make for your current and future health. Consider what you dislike about smoking and how quitting could personally benefit you. Try to cut down. Aim for reducing the amount you smoke over the next few weeks.  Starting today, Be a Quitter!  Remind yourself why you want to quit.  Delay your first cigarette of the day for as long as possible.  Start cleaning out all pockets, drawers, and your car of cigarettes.  Getting Through the Cravings Once You Are Smoke Free: Each craving will last about 10 minutes, whether or not you smoke. Here's how to get through the cravings without cigarettes:  DELAY: Tell yourself that you'll wait for the next craving. Do it every time! DEEP BREATHS: One reason smoking feels good is because you breathe in deeply to inhale. Take four slow, deep breaths and feel the relaxation without the hamful effects of cigarettes. DRINK WATER: Drink a glass of cool water. It will give your hands and mouth something to do and will help flush the nicotine out of your system faster. DIVERT: Do something else -- brush your teeth, take a walk, call a friend who can offer you support. Just moving onto something other than thinking about cigarettes will move you through the craving.   Frequently Asked Questions  What can I do when I get the urge to smoke? To get through the urge to smoke, try the following:  Review your reasons for quitting and think of all the benefits to your health, your finances, and your family.  Remind  yourself that there is no such thing as just one cigarette -- or even one puff.  Ride out the desire to smoke. Use the 4 Os -- Delay, Deep Breaths, Drink Water and Divert to get you through. The craving will go away eventually. Do not fool yourself into thinking you can have just one cigarette.  Any tips on how to deal with stress? Stress is a natural part of life. The key is to deal with it without reaching for a cigarette. Taking deep breaths, counting backwards from 10 and asking yourself 1-how big a deal is this?"  Writing down your feelings, talking with a friend and doing things like positive self-talk and meditation are some other ways that people deal with daily stress.  What if I start smoking again? Slips happen. Most people try to quit smoking a few times before they are successful. Don't beat yourself up if this happens to you! Ask yourself if this was a slip or a relapse. A slip is a one-time mistake that is quickly corrected. A relapse is going back to your old smoking habits.   If you slip, don't give up. Think of it as a learning experience. Ask yourself what went wrong and renew your commitment to staying away from smoking for good.  If you relapse, try not to get discouraged. Ask yourself the question "What caused me to start smoking?" Figure out what helped you and what didn't when you tried to quit.  Knowing why you relapsed is useful information for your next attempt to quit.

## 2023-05-01 NOTE — Assessment & Plan Note (Signed)
  LIBERATE Candidate potential enrollment in study:  Diabetes longstanding currently uncontrolled, but POC A1c (7.8) excludes patient from enrollment in LIBERATE study. Patient is able to verbalize appropriate hypoglycemia management plan. Medication adherence appears good to dulaglutide (Trulicity), but nonadherent to metformin. Control is suboptimal due to life stressors that she has recently had great news with cancer-free status.  -Continued dulaglutide (Trulicity) 4.5 mg once weekly -Discontinued metformin XR 500 mg BID, which patient had been nonadherent to, and added medication intolerance to profile -Patient educated on purpose, proper use, and potential adverse effects of Trulicity and GI upset.  -Extensively discussed pathophysiology of diabetes, recommended lifestyle interventions, dietary effects on blood sugar control.  -Counseled on s/sx of and management of hypoglycemia.

## 2023-05-01 NOTE — Progress Notes (Signed)
S:     Chief Complaint  Deborah Benson presents with   Medication Management    T2DM (Potential LIBERATE Start) & Tobacco Use Disorder    62 y.o. female who presents for diabetes evaluation, education, and management in the context of the LIBERATE Study.   PMH is significant for T2DM, Tobacco Use Disorder, Uterine Cancer.  Deborah Benson was referred and last seen by Primary Care Provider, Dr. Deirdre Priest, on 02/25/2023.  At last visit, olmesartan 5 mg once daily was prescribed.   Today, Deborah Benson arrives in good spirits and presents without any assistance. Reports she recently found out she is cancer free! Reports constipation for several days in a row. Reports she went into a depression after her partner and cousin passed away close together. But she is working hard to take care of herself and reports her mood is much improved. Reports gabapentin does not help with pain, just causes sedation.   Reports she is smoking 5 cigarettes/day, down from 10/day. Smokes [place].However, her apartment recently moved to being smoke-free and this discourages her from wanting to smoke. She has picked up vaping, but she doesn't like it.   Current diabetes medications include: dulaglutide (Trulicity) 4.5 mg once weekly (no longer taking metformin as prescribed - added to intolerance) Current hypertension medications include: olmesartan 5 mg once daily  Current hyperlipidemia medications include: None  Deborah Benson reports adherence to taking all medications. She stopped taking her metformin ~ 1 year ago d/t visual disturbances. Has not taken her olmesartan, as she had issues getting filled  Do you feel that your medications are working for you? yes Have you been experiencing any side effects to the medications prescribed? no Insurance coverage: [place]  Deborah Benson reports hypoglycemic events.  Deborah Benson reports nocturia (nighttime urination). ~4x nightly Deborah Benson reports neuropathy (nerve pain). Deborah Benson denies visual  changes. - Denies blurry vision since she stopped metformin Deborah Benson reports self foot exams.   Deborah Benson reported dietary habits: Eats 2 meals/day - has cut out a lot of dairy/carbs from her diet, went into depression after her partner and cousin died Breakfast: toast Dinner: ground beef, baked chicken, sandwich meat in salads/wraps Snacks: applesauce, canned peaches  Drinks: coffee, going to start making smoothies  Deborah Benson-reported exercise habits: starting chair exercises - reports Dr. Deirdre Priest says she has thin cartilage on her knees and recommends against weight bearing exercises  Age when started using tobacco on a daily basis 18. Brand smoked [place]. Number of cigarettes/day 5.  Estimated nicotine content per cigarette (mg) ~ 1.2mg .  Estimated nicotine intake per day 7mg .   Smokes multiple times per night.    Most recent quit attempt never Longest time ever been tobacco free: a few hours.  Medications used in past cessation efforts include: None  Rates IMPORTANCE of quitting tobacco before EOY on 1-10 scale of 8. Rates CONFIDENCE of quitting tobacco by EOY on 1-10 scale of 9.  Most common triggers to use tobacco include; talking on the phone, after eating   Motivation to quit: grandchildren that she takes care of and complain of the smoke scent O:   Review of Systems  Gastrointestinal:  Positive for constipation.  All other systems reviewed and are negative.   Physical Exam Vitals reviewed.  Constitutional:      Appearance: Normal appearance.  Pulmonary:     Effort: Pulmonary effort is normal.  Neurological:     Mental Status: She is alert.  Psychiatric:        Mood and Affect: Mood  normal.        Behavior: Behavior normal.        Thought Content: Thought content normal.        Judgment: Judgment normal.    Lab Results  Component Value Date   HGBA1C 7.8 (A) 05/01/2023   Vitals:   05/01/23 1447 05/01/23 1449  BP: (!) 144/84 (!) 160/77  Pulse: 76   SpO2:  100%     Lipid Panel     Component Value Date/Time   CHOL 174 09/05/2020 1159   TRIG 129 09/05/2020 1159   HDL 48 09/05/2020 1159   CHOLHDL 3.6 09/05/2020 1159   CHOLHDL 3.3 08/11/2013 1051   VLDL 23 08/11/2013 1051   LDLCALC 103 (H) 09/05/2020 1159    Clinical Atherosclerotic Cardiovascular Disease (ASCVD): No  The 10-year ASCVD risk score (Arnett DK, et al., 2019) is: 47.3%   Values used to calculate the score:     Age: 64 years     Sex: Female     Is Non-Hispanic African American: Yes     Diabetic: Yes     Tobacco smoker: Yes     Systolic Blood Pressure: 160 mmHg     Is BP treated: Yes     HDL Cholesterol: 48 mg/dL     Total Cholesterol: 174 mg/dL   Deborah Benson is participating in a Managed Medicaid Plan:  Yes   A/P:  LIBERATE Candidate potential enrollment in study:  Diabetes longstanding currently uncontrolled, but POC A1c (7.8) excludes Deborah Benson from enrollment in LIBERATE study. Deborah Benson is able to verbalize appropriate hypoglycemia management plan. Medication adherence appears good to dulaglutide (Trulicity), but nonadherent to metformin. Control is suboptimal due to life stressors that she has recently had great news with cancer-free status.  -Continued dulaglutide (Trulicity) 4.5 mg once weekly -Discontinued metformin XR 500 mg BID, which Deborah Benson had been nonadherent to, and added medication intolerance to profile -Deborah Benson educated on purpose, proper use, and potential adverse effects of Trulicity and GI upset.  -Extensively discussed pathophysiology of diabetes, recommended lifestyle interventions, dietary effects on blood sugar control.  -Counseled on s/sx of and management of hypoglycemia.   ASCVD risk - primary prevention in Deborah Benson with diabetes. Last LDL is 103 (2022) not at goal of <70 mg/dL. ASCVD risk factors include T2DM, HTN, Tobacco Use Disorder. moderate intensity statin indicated.  -Consider addition of statin medication at next appointment - did not start  today due to starting varenicline   Hypertension longstanding currently uncontrolled. Blood pressure goal of <130/80  mmHg. Medication adherence poor. Blood pressure control is suboptimal due to medication non adherence due to confusion getting her BP medications. -Restarted olmesartan 5 mg once daily  Tobacco use disorder with moderate nicotine dependence of ~40 years duration in a Deborah Benson who is good candidate for success because of self motivation to quit and self reduction from 10/day to 5/day without assistance. Discussed patients willingness to quit smoking, while continuing vaping for stress, with eventual goal of complete cessation.  -Initiated varenicline 0.5 mg by mouth once daily with food x7 days, then 0.5 mg by mouth twice daily with food thereafter. Deborah Benson counseled on purpose, proper use, and potential adverse effects, including GI upset.  -Provided information on 1 800-QUIT NOW support program.   Written Deborah Benson instructions provided. Deborah Benson verbalized understanding of treatment plan.  Total time in face to face counseling 31 minutes.    Follow-up:  Pharmacist 05/29/2023. PCP clinic visit in PRN.  Deborah Benson seen with Shona Simpson, PharmD Candidate.

## 2023-05-01 NOTE — Assessment & Plan Note (Signed)
Hypertension longstanding currently uncontrolled. Blood pressure goal of <130/80  mmHg. Medication adherence poor. Blood pressure control is suboptimal due to medication non adherence due to confusion getting her BP medications. -Restarted olmesartan 5 mg once daily

## 2023-05-02 NOTE — Progress Notes (Signed)
Reviewed and agree with Dr Koval's plan.   

## 2023-05-14 ENCOUNTER — Ambulatory Visit (HOSPITAL_COMMUNITY): Payer: Medicaid Other

## 2023-05-21 ENCOUNTER — Ambulatory Visit (HOSPITAL_COMMUNITY): Payer: Medicaid Other

## 2023-05-29 ENCOUNTER — Encounter: Payer: Self-pay | Admitting: Pharmacist

## 2023-05-29 ENCOUNTER — Ambulatory Visit: Payer: Medicaid Other | Admitting: Pharmacist

## 2023-05-29 VITALS — BP 140/76 | Wt 387.0 lb

## 2023-05-29 DIAGNOSIS — F17200 Nicotine dependence, unspecified, uncomplicated: Secondary | ICD-10-CM

## 2023-05-29 NOTE — Assessment & Plan Note (Signed)
Tobacco use disorder with moderate nicotine dependence of ~40 years duration in a patient who is good candidate for success because of self motivation to quit and continued progress with reduction of intake to 3-4 daily.  Notes she was able to quit with use of varenicline but the headache did not allow her to continue. We discussed retrial of varenicline 0.5mg  once daily and agreed to a trial following January first.   - Retry varenicline 0.5 mg by mouth once daily with food in ~ 2 weeks - Goal is to quit completely by the end of January.

## 2023-05-29 NOTE — Patient Instructions (Signed)
Nice to see you today!   Medication Changes: In early January - retry varenicline in early January 2025   Tobacco Patient Instructions  Quitting smoking is one of the most important decisions you can make for your current and future health. Consider what you dislike about smoking and how quitting could personally benefit you. Try to cut down. Aim for reducing the amount you smoke over the next few weeks .  My target quit date is: January 2025  Starting today, Be a Quitter!  Remind yourself why you want to quit.  Delay your first cigarette of the day for as long as possible.  Start cleaning out all pockets, drawers, and your car of cigarettes.  Getting Through the Cravings Once You Are Smoke Free: Each craving will last about 10 minutes, whether or not you smoke. Here's how to get through the cravings without cigarettes:  DELAY: Tell yourself that you'll wait for the next craving. Do it every time! DEEP BREATHS: One reason smoking feels good is because you breathe in deeply to inhale. Take four slow, deep breaths and feel the relaxation without the hamful effects of cigarettes. DRINK WATER: Drink a glass of cool water. It will give your hands and mouth something to do and will help flush the nicotine out of your system faster. DIVERT: Do something else -- brush your teeth, take a walk, call a friend who can offer you support. Just moving onto something other than thinking about cigarettes will move you through the craving.   Frequently Asked Questions  What can I do when I get the urge to smoke? To get through the urge to smoke, try the following:  Review your reasons for quitting and think of all the benefits to your health, your finances, and your family.  Remind yourself that there is no such thing as just one cigarette -- or even one puff.  Ride out the desire to smoke. Use the 4 Os -- Delay, Deep Breaths, Drink Water and Divert to get you through. The craving will go away  eventually. Do not fool yourself into thinking you can have just one cigarette.  Any tips on how to deal with stress? Stress is a natural part of life. The key is to deal with it without reaching for a cigarette. Taking deep breaths, counting backwards from 10 and asking yourself 1-how big a deal is this?"  Writing down your feelings, talking with a friend and doing things like positive self-talk and meditation are some other ways that people deal with daily stress.  What if I start smoking again? Slips happen. Most people try to quit smoking a few times before they are successful. Don't beat yourself up if this happens to you! Ask yourself if this was a slip or a relapse. A slip is a one-time mistake that is quickly corrected. A relapse is going back to your old smoking habits.   If you slip, don't give up. Think of it as a learning experience. Ask yourself what went wrong and renew your commitment to staying away from smoking for good.  If you relapse, try not to get discouraged. Ask yourself the question "What caused me to start smoking?" Figure out what helped you and what didn't when you tried to quit. Knowing why you relapsed is useful information for your next attempt to quit.

## 2023-05-29 NOTE — Progress Notes (Signed)
   S:   Chief Complaint  Patient presents with   Medication Management    Tobacco Cessation   62 y.o. female who presents for evaluation/assistance with tobacco dependence.  PMH is significant for recently cancer free from uterine cancer   Patient was referred and last seen by Primary Care Provider, Dr. Deirdre Priest, on 02/25/2023.   When patient started varenicline she reported headache for each of the first three days she took medication which relented when she stopped taking.  She has not rechallenged with varenicline but is willing to consider.   Rates CONFIDENCE of quitting tobacco remains very high.  Motivation to quit: Health - grandchildren   O: Clinical ASCVD: No  The 10-year ASCVD risk score (Arnett DK, et al., 2019) is: 35.4%   Values used to calculate the score:     Age: 51 years     Sex: Female     Is Non-Hispanic African American: Yes     Diabetic: Yes     Tobacco smoker: Yes     Systolic Blood Pressure: 140 mmHg     Is BP treated: Yes     HDL Cholesterol: 48 mg/dL     Total Cholesterol: 174 mg/dL  Review of Systems  All other systems reviewed and are negative.   Physical Exam Pulmonary:     Effort: Pulmonary effort is normal.  Neurological:     Mental Status: She is alert. Mental status is at baseline.  Psychiatric:        Mood and Affect: Mood normal.        Behavior: Behavior normal.        Thought Content: Thought content normal.        Judgment: Judgment normal.     Patient is participating in a Managed Medicaid Plan:  Yes   A/P: Tobacco use disorder with moderate nicotine dependence of ~40 years duration in a patient who is good candidate for success because of self motivation to quit and continued progress with reduction of intake to 3-4 daily.  Notes she was able to quit with use of varenicline but the headache did not allow her to continue. We discussed retrial of varenicline 0.5mg  once daily and agreed to a trial following January first.   -  Retry varenicline 0.5 mg by mouth once daily with food in ~ 2 weeks - Goal is to quit completely by the end of January.   Also met with long-term PCP, Dr. Deirdre Priest today to say goodbye as he is retiring soon.   Written patient instructions provided. Patient verbalized understanding of treatment plan.  Total time in face to face counseling 22 minutes.    Follow-up:  Pharmacist 5-6 weeks (end of January) PCP clinic visit early 2025 - new physician visit

## 2023-05-30 NOTE — Progress Notes (Signed)
Reviewed and agree with Dr Koval's plan.   

## 2023-06-02 ENCOUNTER — Other Ambulatory Visit: Payer: Self-pay | Admitting: Psychiatry

## 2023-06-02 ENCOUNTER — Telehealth: Payer: Self-pay | Admitting: *Deleted

## 2023-06-02 ENCOUNTER — Ambulatory Visit (HOSPITAL_COMMUNITY)
Admission: RE | Admit: 2023-06-02 | Discharge: 2023-06-02 | Disposition: A | Discharge status: Home / Self Care | Payer: Medicaid Other | Source: Ambulatory Visit | Attending: Psychiatry | Admitting: Psychiatry

## 2023-06-02 DIAGNOSIS — E041 Nontoxic single thyroid nodule: Secondary | ICD-10-CM

## 2023-06-02 DIAGNOSIS — C541 Malignant neoplasm of endometrium: Secondary | ICD-10-CM

## 2023-06-02 DIAGNOSIS — Z6841 Body Mass Index (BMI) 40.0 and over, adult: Secondary | ICD-10-CM

## 2023-06-02 DIAGNOSIS — J4541 Moderate persistent asthma with (acute) exacerbation: Secondary | ICD-10-CM

## 2023-06-02 LAB — POCT I-STAT CREATININE: Creatinine, Ser: 0.9 mg/dL (ref 0.44–1.00)

## 2023-06-02 MED ORDER — IOHEXOL 300 MG/ML  SOLN: 100.0000 mL | Freq: Once | INTRAMUSCULAR | Status: DC | PRN

## 2023-06-02 MED ORDER — IOHEXOL 300 MG/ML  SOLN
30.0000 mL | Freq: Once | INTRAMUSCULAR | Status: AC | PRN
  Administered 2023-06-02: 30 mL via ORAL

## 2023-06-02 NOTE — Telephone Encounter (Signed)
Received a call from the CT control room stating "the patient was a hard stick. We needed to get IV team to stick her. That IV blow. The patient is now refusing to get stuck again. Can we go forward without IV contrast?" Per [facility] Mccre[facility] APP ok to proceed

## 2023-06-02 NOTE — Progress Notes (Signed)
Patient ID: Deborah Benson, female   DOB: [birth year], 61 y.o.   MRN: [id number] Pt underwent CT C/A/P today as outpatient with subsequent IV contrast extravasation of approx 50 cc to rt forearm ; pt did have some discomfort during injection and infusion was then stopped; on exam rt forearm is soft, no erythematous skin changes or blistering, intact radial pulse, good motor fxn; rec ice pack to forearm prn next 24-48 hrs, arm elevation when possible; pt instructed on what to monitor for if any changes occur; will contact pt on 12/24 for follow up; pt to contact our team with any additional questions/concerns

## 2023-06-03 ENCOUNTER — Telehealth: Payer: Self-pay | Admitting: Radiology

## 2023-06-03 NOTE — Telephone Encounter (Signed)
Called patient to follow up for a contrast extravasation during her scan yesterday. Patient reports she did experience some swelling last night, but it resolved this morning. Patient reports the arm pain improved. She does notice an outline where the contrast went, but no other discoloration of the arm. Patient denies blisters, pain/numbness/tingling down the arm and into the hand. Patient was advised to keep using ice and elevate the arm. Patient advised to contact the IR group if symptoms change or get worse.

## 2023-06-23 ENCOUNTER — Telehealth: Payer: Self-pay

## 2023-06-23 NOTE — Telephone Encounter (Signed)
 Deborah Benson agreed to February 10 at 3:15 appointment. She will keep an eye on her bleeding and cramping and will call back if heavy, soaking a pad or if cramping increases. She will continue Tylenol/Ibuprofen rotation

## 2023-06-23 NOTE — Telephone Encounter (Signed)
 Lvm on the number given [phone]) at previous intake message.   Pt needs to be rescheduled from 09/2023 to early February, per Dr.Newton.

## 2023-06-23 NOTE — Telephone Encounter (Signed)
 Deborah Benson called the office stating she started bleeding yesterday.   Vaginal bleeding triage protocol reviewed with pt:  She reports she has an IUD in place.  Last month she had intermittent cramping with no bleeding in which she monitored. Yesterday she started having light pink bleeding, looks stringy, drops in toilet. No clots. Cramping is intermittent, 7/10 on pain scale. She is taking Ibuprofen for pain which does help. She is not having to wear a pad. The cramping is a menstrual type cramp at the top of her pelvic area.  Reports not taking any oral hormones, no fever/chills, no UTI S&S (pain, pressure, burning or frequency) reports nothing in the vagina.   Pt has a follow up appointment scheduled in April with Dr.Newton. She is wondering if she needs to move that appointment up?  Message sent to Dr.Tucker for review and advice

## 2023-07-08 ENCOUNTER — Ambulatory Visit: Payer: Medicaid Other | Admitting: Pharmacist

## 2023-07-21 ENCOUNTER — Encounter: Payer: Self-pay | Admitting: Psychiatry

## 2023-07-21 ENCOUNTER — Inpatient Hospital Stay: Payer: Medicaid Other | Attending: Psychiatry | Admitting: Psychiatry

## 2023-07-21 VITALS — BP 148/78 | HR 66 | Temp 98.7°F | Resp 20 | Wt 388.0 lb

## 2023-07-21 DIAGNOSIS — J4541 Moderate persistent asthma with (acute) exacerbation: Secondary | ICD-10-CM | POA: Insufficient documentation

## 2023-07-21 DIAGNOSIS — Z975 Presence of (intrauterine) contraceptive device: Secondary | ICD-10-CM | POA: Insufficient documentation

## 2023-07-21 DIAGNOSIS — Z79811 Long term (current) use of aromatase inhibitors: Secondary | ICD-10-CM | POA: Insufficient documentation

## 2023-07-21 DIAGNOSIS — E041 Nontoxic single thyroid nodule: Secondary | ICD-10-CM

## 2023-07-21 DIAGNOSIS — F17210 Nicotine dependence, cigarettes, uncomplicated: Secondary | ICD-10-CM | POA: Diagnosis not present

## 2023-07-21 DIAGNOSIS — Z6841 Body Mass Index (BMI) 40.0 and over, adult: Secondary | ICD-10-CM | POA: Insufficient documentation

## 2023-07-21 DIAGNOSIS — C541 Malignant neoplasm of endometrium: Secondary | ICD-10-CM | POA: Diagnosis present

## 2023-07-21 MED ORDER — LETROZOLE 2.5 MG PO TABS: 2.5000 mg | ORAL_TABLET | Freq: Every day | ORAL | 11 refills | Status: DC

## 2023-07-21 NOTE — Progress Notes (Signed)
 Gynecologic Oncology Return Clinic Visit  Date of Service: 07/21/2023 Referring Provider: Amiel Balder, MD   Assessment & Plan: Deborah Benson is a 63 y.o. 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  Endometrial cancer: - Pt not previously a surgical candidate due to obesity and pulmonary status. Pt was admitted to the hospital for 5 days after her D&C due to hypoxia. Was previously unable to lay flat which limited her option for radiation treatment, however, now able to lay flat and overall improving respiratory status. - Continue at this time with IUD and letrozole . -Last CT scan in December 2024, stable overall NED.  Plan repeat in 6 months.   - EMB collected today to assess treatment status. - If inadequate treatment with current regimen, given improvement in respiratory status and weight loss, could reconsider definitive radiation treatment versus attempt at robotic hysterectomy. - Germline genetic testing negative. - Plan return visit in 3 months.   Cramping abdominal pain: - Given nature of pain, moving, and normal CT scan for endometrial cancer, possibly GI in origin - Pt has never had a colonoscopy - Recommend that pt schedule her colonoscopy as previously recommended  Morbid obesity: -Patient has been working on lifestyle changes to help with weight loss and has been on Trulicity .  lost over 80 pounds initially, weight now stable. - Patient congratulated on these changes. -Continue to monitor.  Reviewed goal with patient of getting closer to 300 pounds prior to reconsidering hysterectomy as a definitive intervention for her endometrial cancer. Wt Readings from Last 5 Encounters:  07/21/23 (!) 388 lb (176 kg)  05/29/23 (!) 387 lb (175.5 kg)  05/01/23 (!) 384 lb 9.6 oz (174.5 kg)  04/14/23 (!) 389 lb 6.4 oz (176.6  kg)  04/02/23 (!) 381 lb 1.6 oz (172.9 kg)   Asthma, improving: -Last seen by pulmonology on 04/02/2023. - Follow-up in 6 months  Thyroid  nodules, mediastinal nodule: -Unchanged mediastinal nodule at thoracic inlet on CT scan. - Per CT report, differential includes thyroid  nodule, ectopic thyroid  tissue, or thymoma. - Patient has referral in with endocrinology.  Had appointment in August 2024 but did not present.  Patient encouraged to reschedule. - Number provided for pt to call and reschedule   RTC 3 months.  Derrel Flies, MD Gynecologic Oncology   Medical Decision Making I personally spent  TOTAL 35 minutes face-to-face and non-face-to-face in the care of this patient, which includes all pre, intra, and post visit time on the date of service.  ----------------------- Reason for Visit: Surveillance  Treatment History: Oncology History Overview Note  MSI stable, ER 95%, PR 100% Genetics neg   Uterine cancer (HCC)  04/17/2022 Imaging   US  pelvis 1. Prominent abnormal endometrial thickening (41 mm). In the setting of post-menopausal bleeding, endometrial sampling is indicated to exclude carcinoma. If results are benign, sonohysterogram should be considered for focal lesion work-up.  2. Nonvisualization of the ovaries. No adnexal masses.   06/04/2022 Pathology Results   A. 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  See comment.    07/17/2022 Imaging   MRI pelvis 1. Maximal thickness of the endometrial canal approaches 2.5 cm, compared to gross thickening up to 4 cm on the previous exam. Presence is suspicious for residual tumor in the endometrium but shows improvement since previous  imaging. Exam quality is compromised by patient's body habitus. Depth of myometrial invasion is difficult to assess on the current study but there is  certainly no gross invasion beyond 50%. If additional MRI follow-up is desired direct involvement from a radiologist for protocol prescription may be helpful to mitigate artifacts and maximize image quality. 2. IUD in place 3. 11 mm RIGHT pelvic sidewall lymph node other smaller lymph nodes in the pelvis. This finding is nonspecific but suspicious in light of gynecologic neoplasm. PET imaging could be considered for further evaluation.   07/22/2022 Initial Diagnosis   Uterine cancer (HCC)   08/21/2022 Imaging   Lack of intravenous contrast administration and photon attenuation related to patient habitus limit evaluation of the thoracic inlet and pelvis. Within this context:   1. IUD in place in the uterus. Pelvic structures not well evaluated on this examination. 2. Pelvic sidewall lymph node seen on prior MRI is not identified on this examination, likely secondary to artifact from photon attenuation. 3. Enlarged periportal lymph nodes measure up to 16 mm in short axis, nonspecific and commonly reactive. However metastatic nodal disease while not favored is not excluded. Consider attention on follow-up imaging. 4. Lobular enlargement of the thyroid  gland measuring up to 2.7 cm, suggest further evaluation with dedicated thyroid  ultrasound. 5. Soft tissue nodule anterior to the great vessels and subjacent to the thyroid  gland measuring 2.7 x 1.8 cm, is nonspecific possibly reflecting an enlarged lymph node or parathyroid adenoma. Consider further evaluation with contrast enhanced CT of the neck. 6. Stranding in the anterior abdominal wall, nonspecific but possibly reflecting cellulitis. 7.  Aortic Atherosclerosis (ICD10-I70.0).   08/21/2022 Cancer Staging   Staging form: Corpus Uteri - Carcinoma and Carcinosarcoma, AJCC 8th Edition - Clinical stage from 08/21/2022: Stage IIIC2 (cT1, cN2a, cM0) - Signed by Almeda Jacobs, MD on 08/21/2022 Stage prefix: Initial diagnosis    Genetic Testing   [facility]  CancerNext-Expanded Panel+RNA was Negative. Report date is 08/23/2022.  The CancerNext-Expanded gene panel offered by [facility] and includes sequencing, rearrangement, and RNA analysis for the following 71 genes: AIP, ALK, APC, ATM, AXIN2, BAP1, BARD1, BMPR1A, BRCA1, BRCA2, BRIP1, CDC73, CDH1, CDK4, CDKN1B, CDKN2A, CHEK2, CTNNA1, DICER1, FH, FLCN, KIF1B, LZTR1, MAX, MEN1, MET, MLH1, MSH2, MSH3, MSH6, MUTYH, NF1, NF2, NTHL1, PALB2, PHOX2B, PMS2, POT1, PRKAR1A, PTCH1, PTEN, RAD51C, RAD51D, RB1, RET, SDHA, SDHAF2, SDHB, SDHC, SDHD, SMAD4, SMARCA4, SMARCB1, SMARCE1, STK11, SUFU, TMEM127, TP53, TSC1, TSC2, and VHL (sequencing and deletion/duplication); EGFR, EGLN1, HOXB13, KIT, MITF, PDGFRA, POLD1, and POLE (sequencing only); EPCAM and GREM1 (deletion/duplication only).    08/23/2022 -  Anti-estrogen oral therapy   She is started on Femara    08/29/2022 Imaging   1. Thyromegaly with findings suggestive of multinodular goiter. 2. Nodules labeled #1 and #4 both meet imaging criteria to recommend percutaneous sampling as indicated. 3. Nodule #3 meets imaging criteria to recommend a 1 year follow-up.  4. Note, the substernal nodule demonstrated on recent chest CT was not visualized sonographically due to substernal location. Further evaluation could be performed with nuclear medicine thyroid  uptake and scan (to evaluate if this substernal tissue represents exophytic or ectopic thyroid  parenchyma) versus contrast-enhanced neck CT as indicated.   11/25/2022 Tumor Marker   Patient's tumor was tested for the following markers: CA-125. Results of the tumor marker test revealed 9.2.   11/25/2022 Imaging   CT CHEST ABDOMEN PELVIS W CONTRAST  Result Date: 11/22/2022 CLINICAL DATA:  Cervical cancer, assess treatment response, ongoing oral chemotherapy * Tracking Code:  BO * EXAM: CT CHEST, ABDOMEN, AND PELVIS WITH CONTRAST TECHNIQUE: Multidetector CT imaging of the chest, abdomen and pelvis was performed following  the standard protocol during bolus administration of intravenous contrast. RADIATION DOSE REDUCTION: This exam was performed according to the departmental dose-optimization program which includes automated exposure control, adjustment of the mA and/or kV according to patient size and/or use of iterative reconstruction technique. CONTRAST:  100mL OMNIPAQUE  IOHEXOL  300 MG/ML SOLN additional oral enteric contrast COMPARISON:  CT chest abdomen pelvis, 08/19/2022, thyroid  ultrasound, 08/29/2022 FINDINGS: Examination is significantly limited by patient body habitus and related photopenia. CT CHEST FINDINGS Cardiovascular: No significant vascular findings. Mild cardiomegaly. No pericardial effusion. Mediastinum/Nodes: Unchanged anterior mediastinal nodule just below the thoracic inlet measuring 2.9 x 1.5 cm (series 2, image 16). No enlarged mediastinal, hilar, or axillary lymph nodes. Heterogeneously enlarged thyroid  gland, previously assessed by dedicated thyroid  ultrasound. This has been evaluated on previous imaging. (ref: J Am Coll Radiol. 2015 Feb;12(2): 143-50). Trachea, and esophagus demonstrate no significant findings. Lungs/Pleura: Unchanged bandlike scarring or atelectasis throughout the lungs. No pleural effusion or pneumothorax. Musculoskeletal: No chest wall abnormality. No acute osseous findings. CT ABDOMEN PELVIS FINDINGS Hepatobiliary: Hepatomegaly, maximum coronal span 20.3 cm. No solid liver abnormality is seen. No gallstones, gallbladder wall thickening, or biliary dilatation. Pancreas: Unremarkable. No pancreatic ductal dilatation or surrounding inflammatory changes. Spleen: Normal in size without significant abnormality. Adrenals/Urinary Tract: Adrenal glands are unremarkable. Kidneys are normal, without renal calculi, solid lesion, or hydronephrosis. Bladder is unremarkable. Stomach/Bowel: Stomach is within normal limits. Appendix appears normal. No evidence of bowel wall thickening, distention, or  inflammatory changes. Vascular/Lymphatic: Aortic atherosclerosis. Unchanged enlarged portacaval lymph node measuring up to 2.8 x 1.6 cm (series 2, image 67). Unchanged prominent inguinal lymph nodes (series 2, image 115). Reproductive: IUD present in the fundal endometrial cavity (series 2, image 106). Evaluation of the pelvic structures very limited by body habitus and photopenia; no obvious cervical mass appreciated. Other: No abdominal wall hernia or abnormality. No ascites. Musculoskeletal: No acute osseous findings. IMPRESSION: 1. Examination is significantly limited by patient body habitus and related photopenia. 2. Evaluation of the pelvic structures particular is very limited; no obvious cervical mass appreciated. 3. Unchanged enlarged portacaval lymph node measuring up to 2.8 x 1.6 cm, as previously reported, nonspecific and most likely reactive. Unchanged prominent inguinal lymph nodes. No specific evidence of nodal metastatic disease. Continued attention on follow-up. 4. Unchanged anterior mediastinal nodule just below the thoracic inlet measuring 2.9 x 1.5 cm. This was not visualized on recent prior thyroid  ultrasound, but nonetheless may reflect a substernal thyroid  nodule or ectopic thyroid  tissue. Alternately this could reflect an anterior mediastinal mass such as a thymoma. At either rate, this does not likely reflect metastatic disease cervical cancer. Contrast enhanced MR of the chest or nuclear thyroid  uptake scan may be helpful to further assess. 5. Hepatomegaly. Aortic Atherosclerosis (ICD10-I70.0). Electronically Signed   By: Fredricka Jenny M.D.   On: 11/22/2022 22:31        Interval History: Overall feels that she is doing well.  Does note some occasional cramping abdominal pains that move around her abdomen.  This has been going on for couple of months.  Does not feel constipated but also feels like her bowel movements are hard to come out without having hard bowel movements.  The pain  is sometimes positional.  She has not yet had a colonoscopy as she was told she may need to have in the hospital.  She otherwise  reports that she continues on Trulicity  and feels that she is doing well on this.  Weight is overall stable.  Breathing is stable.  Only using her albuterol  inhaler occasionally at night and during a cold.  Still smoking about 6 cigarettes a day.  Does note occasional pink when she wipes but no ongoing bleeding.  Otherwise denies new bloating, nausea, vomiting.   Past Medical/Surgical History: Past Medical History:  Diagnosis Date   Arthritis    Asthma    Cancer (HCC)    ovarian cancer   Depression    Diabetes mellitus without complication (HCC)    oral meds only   Gout    HTN (hypertension)    "borderline" med was stopped by pt- MD aware.   Neuromuscular disorder (HCC)    neuropathy -hands/ feet   Neuropathy    Obesity    BMI >76.6   Pneumonia     Past Surgical History:  Procedure Laterality Date   FOOT SURGERY     due to needle break in foot   HYSTEROSCOPY WITH D & C N/A 06/04/2022   Procedure: DILATATION AND CURETTAGE;  Surgeon: Derrel Flies, MD;  Location: [facility];  Service: Gynecology;  Laterality: N/A;   INTRAUTERINE DEVICE (IUD) INSERTION N/A 06/04/2022   Procedure: MIRENA  INTRAUTERINE DEVICE (IUD) INSERTION;  Surgeon: Derrel Flies, MD;  Location: [facility];  Service: Gynecology;  Laterality: N/A;   MULTIPLE TOOTH EXTRACTIONS     TONSILLECTOMY      Family History  Problem Relation Age of Onset   Heart attack Mother    Stroke Mother    Gout Mother    Hypertension Mother    Lung cancer Father 66 - 66       smoked   Glaucoma Father    Heart disease Sister        fluid    Other Sister        spinal disease   Throat cancer Sister    Diabetes Brother    Prostate cancer Maternal Uncle    Ovarian cancer Maternal Grandmother    Dementia Maternal Grandfather    Glaucoma Other        nephew   Seizures Other        niece   Colon  cancer Neg Hx    Breast cancer Neg Hx    Endometrial cancer Neg Hx    Pancreatic cancer Neg Hx     Social History   Socioeconomic History   Marital status: Single    Spouse name: Not on file   Number of children: 0   Years of education: Not on file   Highest education level: GED or equivalent  Occupational History   Occupation: disabled  Tobacco Use   Smoking status: Every Day    Current packs/day: 0.40    Average packs/day: 0.4 packs/day for 45.1 years (18.0 ttl pk-yrs)    Types: Cigarettes    Start date: 06/10/1978   Smokeless tobacco: Never   Tobacco comments:    down to 5 per day - Previous 7-10  Vaping Use   Vaping status: Never Used  Substance and Sexual Activity   Alcohol use: No    Alcohol/week: 0.0 standard drinks of alcohol   Drug use: No   Sexual activity: Not Currently  Other Topics Concern   Not on file  Social History Narrative         Past Medical History:      Irregular menstrual periods  Asthma         Social History:      Disabled?,  Weight and OA; Moved to [place] 6 yrs ago.  Single         Social Drivers of Health   Financial Resource Strain: Low Risk  (02/23/2023)   Overall Financial Resource Strain (CARDIA)    Difficulty of Paying Living Expenses: Not very hard  Food Insecurity: Food Insecurity Present (02/23/2023)   Hunger Vital Sign    Worried About Running Out of Food in the Last Year: Sometimes true    Ran Out of Food in the Last Year: Patient declined  Transportation Needs: No Transportation Needs (02/23/2023)   PRAPARE - Administrator, Civil Service (Medical): No    Lack of Transportation (Non-Medical): No  Physical Activity: Unknown (02/23/2023)   Exercise Vital Sign    Days of Exercise per Week: Patient declined    Minutes of Exercise per Session: Not on file  Stress: No Stress Concern Present (02/23/2023)   [employer] of Occupational Health - Occupational Stress Questionnaire    Feeling of Stress : Only a  little  Social Connections: Unknown (02/23/2023)   Social Connection and Isolation Panel [NHANES]    Frequency of Communication with Friends and Family: More than three times a week    Frequency of Social Gatherings with Friends and Family: More than three times a week    Attends Religious Services: More than 4 times per year    Active Member of [employer] or Organizations: Yes    Attends Engineer, structural: More than 4 times per year    Marital Status: Patient declined    Current Medications:  Current Outpatient Medications:    albuterol  (PROVENTIL ) (2.5 MG/3ML) 0.083% nebulizer solution, Take 3 mLs (2.5 mg total) by nebulization every 6 (six) hours as needed for wheezing or shortness of breath., Disp: 150 mL, Rfl: 1   cholecalciferol (VITAMIN D3) 25 MCG (1000 UNIT) tablet, Take 1,000 Units by mouth daily., Disp: , Rfl:    Dulaglutide  (TRULICITY ) 4.5 MG/0.5ML SOPN, Inject 4.5 mg as directed once a week., Disp: 8 mL, Rfl: 3   gabapentin  (NEURONTIN ) 100 MG capsule, Take 1 capsule (100 mg total) by mouth 3 (three) times daily. (Patient taking differently: Take 100 mg by mouth 2 (two) times daily.), Disp: 90 capsule, Rfl: 0   nystatin  (MYCOSTATIN /NYSTOP ) powder, Apply 1 Application topically 3 (three) times daily as needed., Disp: 60 g, Rfl: 5   traZODone  (DESYREL ) 50 MG tablet, TAKE 1/2 TO 1 TABLET(25 TO 50 MG) BY MOUTH AT BEDTIME AS NEEDED FOR SLEEP, Disp: 15 tablet, Rfl: 0   letrozole  (FEMARA ) 2.5 MG tablet, Take 1 tablet (2.5 mg total) by mouth daily., Disp: 30 tablet, Rfl: 11  Review of Symptoms: Complete 10-system review is positive for: Urinary frequency, joint pain, wheezing, abdominal distention, numbness, abdominal pain, depression, hot flashes, problem with walking  Physical Exam: BP (!) 166/76 (BP Location: Right Arm, Patient Position: Sitting) Comment: Notified RN  Pulse 66   Temp 98.7 F (37.1 C) (Oral)   Resp 20   Wt (!) 388 lb (176 kg)   SpO2 95%   BMI 66.60 kg/m   General: Alert, oriented, no acute distress. HEENT: Normocephalic, atraumatic. Sclera anicteric.  Chest: Normal work of breathing. CTAB Cardiovascular: Regular rate and rhythm, no murmurs. Abdomen: Soft, nontender.  Normoactive bowel sounds.  No masses appreciated.   Extremities: Grossly normal range of motion.  Warm, well perfused.  No  edema bilaterally. Skin: No rashes or lesions noted. Lymphatics: No cervical, supraclavicular, or inguinal adenopathy. GU: Normal appearing external genitalia without erythema, excoriation, or lesions.  Speculum exam reveals normal cervix with IUD strings visualized. Bimanual exam reveals normal cervix. Uterus and adnexa unable to be palpated due to body habitus. Exam chaperoned by Kimberly [place], CMA   EMB Procedure: After appropriate verbal informed consent was obtained, a timeout was performed. A sterile speculum was placed in the vagina, and the area was cleaned with betadine x3. A single-tooth tenaculum was placed on the anterior lip of the cervix.  A cervical block was placed with 10 mL of 2% lidocaine  at 4 and 8:00.  An endometrial biopsy pipelle was advanced carefully to the uterine fundus which sounded to 9.5cm. An adequate sample was obtained over 2 pass. The tenaculum was removed, and tenaculum sites were noted to be hemostatic. The speculum was removed from the vagina. The patient tolerated the procedure well.   UPT: not indicated   Laboratory & Radiologic Studies: Surgical pathology (04/13/24): A. ENDOMETRIAL BIOPSY:  -  Disordered proliferative endometrium and pseudodecidualized stroma  with chronic inflammatory cells and hemosiderin-laden macrophages.   Note: The clinical history of endometrial cancer is noted.  There is no  evidence of cancer, atypia or hyperplasia in the current specimen.

## 2023-07-21 NOTE — Patient Instructions (Signed)
 It was a pleasure to see you in clinic today. - Recommend that you try to get a colonoscopy completed - Recommend you call to reschedule your endocrinology appointment to evaluate the thyroid  nodule - I will let you know the biopsy results when available. - Return visit planned for 68mo  Thank you very much for allowing me to provide care for you today.  I appreciate your confidence in choosing our Gynecologic Oncology team at [facility].  If you have any questions about your visit today please call our office or send us  a MyChart message and we will get back to you as soon as possible.

## 2023-07-22 ENCOUNTER — Ambulatory Visit: Payer: Medicaid Other | Admitting: Pharmacist

## 2023-07-23 LAB — SURGICAL PATHOLOGY

## 2023-07-28 ENCOUNTER — Encounter: Payer: Self-pay | Admitting: Psychiatry

## 2023-08-05 ENCOUNTER — Ambulatory Visit: Payer: Medicaid Other | Admitting: Pharmacist

## 2023-08-05 ENCOUNTER — Encounter: Payer: Self-pay | Admitting: Pharmacist

## 2023-08-05 VITALS — Wt 383.3 lb

## 2023-08-05 DIAGNOSIS — Z7985 Long-term (current) use of injectable non-insulin antidiabetic drugs: Secondary | ICD-10-CM

## 2023-08-05 DIAGNOSIS — B379 Candidiasis, unspecified: Secondary | ICD-10-CM

## 2023-08-05 DIAGNOSIS — E119 Type 2 diabetes mellitus without complications: Secondary | ICD-10-CM

## 2023-08-05 DIAGNOSIS — F17200 Nicotine dependence, unspecified, uncomplicated: Secondary | ICD-10-CM

## 2023-08-05 MED ORDER — NYSTATIN 100000 UNIT/GM EX POWD
1.0000 | Freq: Three times a day (TID) | CUTANEOUS | 5 refills | Status: DC | PRN
Start: 2023-08-05 — End: 2024-04-30

## 2023-08-05 NOTE — Assessment & Plan Note (Signed)
 Tobacco use disorder with moderate nicotine dependence of ~40 years duration in a patient who is good candidate for success because of wanting to improve health and want to be completely quit when her living arrangements change. Patient reports her apartment complex is renovating and will no longer allow smoking and will hopefully be moving in with long-term partner. Utilizes a small compact cigarette case and can only hold 4-6 cigarettes.  Did not find varenicline helpful. Does not want to reinitiate.  -Goal is to reduce smoking to 3 cigarettes a day or less. -Provided information on 1 800-QUIT NOW support program.

## 2023-08-05 NOTE — Patient Instructions (Signed)
 Nice to see you today!   Medication Changes:  Continue all other medication the same.    Tobacco Patient Instructions  Quitting smoking is one of the most important decisions you can make for your current and future health. Consider what you dislike about smoking and how quitting could personally benefit you. Try to cut down. Aim for reducing the amount you smoke by 4 to 3 cigarettes over the next 2 weeks.  My target quit date is: TBD  Starting today, Be a Quitter!  Remind yourself why you want to quit.  Delay your first cigarette of the day for as long as possible.  Start cleaning out all pockets, drawers, and your car of cigarettes.  Getting Through the Cravings Once You Are Smoke Free: Each craving will last about 10 minutes, whether or not you smoke. Here's how to get through the cravings without cigarettes:  DELAY: Tell yourself that you'll wait for the next craving. Do it every time! DEEP BREATHS: One reason smoking feels good is because you breathe in deeply to inhale. Take four slow, deep breaths and feel the relaxation without the hamful effects of cigarettes. DRINK WATER: Drink a glass of cool water. It will give your hands and mouth something to do and will help flush the nicotine out of your system faster. DIVERT: Do something else -- brush your teeth, take a walk, call a friend who can offer you support. Just moving onto something other than thinking about cigarettes will move you through the craving.   Frequently Asked Questions  What can I do when I get the urge to smoke? To get through the urge to smoke, try the following:  Review your reasons for quitting and think of all the benefits to your health, your finances, and your family.  Remind yourself that there is no such thing as just one cigarette -- or even one puff.  Ride out the desire to smoke. Use the 4 Os -- Delay, Deep Breaths, Drink Water and Divert to get you through. The craving will go away eventually. Do  not fool yourself into thinking you can have just one cigarette.  Any tips on how to deal with stress? Stress is a natural part of life. The key is to deal with it without reaching for a cigarette. Taking deep breaths, counting backwards from 10 and asking yourself 1-how big a deal is this?"  Writing down your feelings, talking with a friend and doing things like positive self-talk and meditation are some other ways that people deal with daily stress.  What if I start smoking again? Slips happen. Most people try to quit smoking a few times before they are successful. Don't beat yourself up if this happens to you! Ask yourself if this was a slip or a relapse. A slip is a one-time mistake that is quickly corrected. A relapse is going back to your old smoking habits.   If you slip, don't give up. Think of it as a learning experience. Ask yourself what went wrong and renew your commitment to staying away from smoking for good.  If you relapse, try not to get discouraged. Ask yourself the question "What caused me to start smoking?" Figure out what helped you and what didn't when you tried to quit. Knowing why you relapsed is useful information for your next attempt to quit.

## 2023-08-05 NOTE — Assessment & Plan Note (Signed)
 Weight Loss/Diabetes -Patient has been on max dose Trulicity (dulaglutide) (has 4 pens left). Has had ~4lbs weight loss since last visit. -Reports daughter started going to the gym and is debating joining her. -Discussed switching to semaglutide (Ozempic) if weight loss remains stagnant. Encouraged to discuss with PCP at next PCP visit.  Nystatin refilled for control of symptomatic itching of skin folds.

## 2023-08-05 NOTE — Progress Notes (Signed)
 S:   Chief Complaint  Patient presents with   Medication Management    Tobacco Cessation   63 y.o. female who presents for evaluation/assistance with tobacco dependence.  PMH is significant for cancer free from uterine cancer.   Patient reports smoking 4 cigarettes/day (the most on bad days being 6 cigarettes). Reports that upon waking in the morning she feels heaviness on her chest and coughs up a lot of mucus. Wakes around 6:30 am with children and does not have first cigarette until around 11 am.   Patient reports that her current cancer scans have come back negative and she will be "ringing the bell" in MAY!   She will potentially by moving to [place] with long-term partner in March.   Patient was referred and last seen by Primary Care Provider, Dr. Deirdre Priest, on 9/17.24.   At last visit, discussed restarting varenicline 0.5 mg daily with food. Had goal to quit completely by the end of January.  Medications used in past cessation efforts include: varenicline  Rates CONFIDENCE of quitting tobacco on 1-10 scale of 6.  Most common triggers to use tobacco include; stress/"people getting on my nerves", after meals  Motivation to quit: apartment complex is converting to smoking free  O: Clinical ASCVD: No The 10-year ASCVD risk score (Arnett DK, et al., 2019) is: 32.5%   Values used to calculate the score:     Age: 47 years     Sex: Female     Is Non-Hispanic African American: Yes     Diabetic: Yes     Tobacco smoker: Yes     Systolic Blood Pressure: 148 mmHg     Is BP treated: No     HDL Cholesterol: 48 mg/dL     Total Cholesterol: 174 mg/dL  Review of Systems  HENT:         Complains of ear fullness. Intermittent 2x weekly.   Respiratory:  Positive for cough (in morning) and sputum production (in morning).   All other systems reviewed and are negative.   Physical Exam Constitutional:      Appearance: She is obese.  Pulmonary:     Effort: Pulmonary effort is  normal.  Neurological:     Mental Status: She is alert.  Psychiatric:        Mood and Affect: Mood normal.        Behavior: Behavior normal.     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 Patient reports her apartment complex is renovating and will no longer allow smoking and will hopefully be moving in with long-term partner. Utilizes a small compact cigarette case and can only hold 4-6 cigarettes.  Did not find varenicline helpful. Does not want to reinitiate.  -Goal is to reduce smoking to 3 cigarettes a day or less. -Provided information on 1 800-QUIT NOW support program.   Weight Loss/Diabetes -Patient has been on max dose Trulicity (dulaglutide) (has 4 pens left). Has had ~4lbs weight loss since last visit. -Reports daughter started going to the gym and is debating joining her. -Discussed switching to semaglutide (Ozempic) if weight loss remains stagnant. Encouraged to discuss with PCP at next PCP visit.  Nystatin refilled for control of symptomatic itching of skin folds.  Written patient instructions provided. Patient verbalized understanding of treatment plan.  Total time in face to face counseling 17 minutes.    Follow-up:  Pharmacist TBD PCP clinic visit 09/01/23 Patient seen with  Pearletha Forge, PharmD Candidate.

## 2023-08-07 NOTE — Progress Notes (Signed)
 Reviewed and agree with Dr Macky Lower plan.

## 2023-08-25 ENCOUNTER — Telehealth: Payer: Self-pay | Admitting: Pharmacist

## 2023-08-25 NOTE — Telephone Encounter (Signed)
 Patient contacted for follow/up of tobacco intake reduction.  Since last contact patient reports she has decreased smoking to 2-3 per day while away for a week.    Medications currently being used;  None  Continues to rates IMPORTANCE of quitting tobacco as high.  Continues to rate CONFIDENCE of quitting tobacco as high.   Most common triggers to use tobacco include; habit   Motivation to quit: health / being a non-smoker  Patient is participating in a Managed Medicaid Plan:  Yes  Total time with patient call and documentation of interaction: 9 minutes. Follow-up phone call planned: None  PCP visit in 1 week with Dr. Manson Passey.  Patient anticipating move to [place] in the future.   Please schedule back in pharmacy clinic as needed if patient still smoking at follow-up.

## 2023-08-31 ENCOUNTER — Encounter: Payer: Self-pay | Admitting: Family Medicine

## 2023-08-31 NOTE — Progress Notes (Unsigned)
    SUBJECTIVE:   CHIEF COMPLAINT: check in HPI:   Deborah Benson is a 63 y.o.  with history notable for endometrial cancer, type 2 DM, HTN and tobacco use presenting for follow up .   PERTINENT  PMH / PSH/Family/Social History : ***  OBJECTIVE:   There were no vitals taken for this visit.  Today's weight:  Review of prior weights: There were no vitals filed for this visit.  ***  ASSESSMENT/PLAN:   Assessment & Plan Primary hypertension  Type 2 diabetes mellitus without complication, without long-term current use of insulin (HCC)    Terisa Starr, MD  Family Medicine Teaching Service  [facility]

## 2023-09-01 ENCOUNTER — Ambulatory Visit: Payer: Medicaid Other | Admitting: Family Medicine

## 2023-09-01 ENCOUNTER — Encounter: Payer: Self-pay | Admitting: Family Medicine

## 2023-09-01 VITALS — BP 171/74 | HR 72 | Ht 64.0 in | Wt 379.8 lb

## 2023-09-01 DIAGNOSIS — I10 Essential (primary) hypertension: Secondary | ICD-10-CM | POA: Diagnosis not present

## 2023-09-01 DIAGNOSIS — E119 Type 2 diabetes mellitus without complications: Secondary | ICD-10-CM

## 2023-09-01 DIAGNOSIS — T50905A Adverse effect of unspecified drugs, medicaments and biological substances, initial encounter: Secondary | ICD-10-CM

## 2023-09-01 DIAGNOSIS — N3941 Urge incontinence: Secondary | ICD-10-CM | POA: Diagnosis not present

## 2023-09-01 DIAGNOSIS — R232 Flushing: Secondary | ICD-10-CM | POA: Diagnosis not present

## 2023-09-01 LAB — POCT GLYCOSYLATED HEMOGLOBIN (HGB A1C): HbA1c, POC (controlled diabetic range): 7.6 % — AB (ref 0.0–7.0)

## 2023-09-01 MED ORDER — TRULICITY 4.5 MG/0.5ML ~~LOC~~ SOAJ: 4.5000 mg | SUBCUTANEOUS | 3 refills | Status: DC

## 2023-09-01 NOTE — Assessment & Plan Note (Addendum)
 Not taking olmesartan Reports she syncopizes with BP medication Has home cuff will send in 1-2 values per week Discussed options including adding medication etc---shared decision making used given history

## 2023-09-01 NOTE — Assessment & Plan Note (Addendum)
 Congratulated on A1C UACR and BMP today Continue Trulicity

## 2023-09-01 NOTE — Patient Instructions (Addendum)
 It was wonderful to see you today.  Please bring ALL of your medications with you to every visit.   Today we talked about:  - CONGRATULATIONS on your A1C  Keep up the good work with smoking cessation  Consider sugar free lollipops   Congratulations on your engagement  Please limit green tea   I will message you with blood work   [employer]--- you can look up this medication for your urine symptoms   Please follow up in 3 months   Thank you for choosing [facility].   Please call [phone] with any questions about today's appointment.  Please be sure to schedule follow up at the front  desk before you leave today.   Terisa Starr, MD  Family Medicine

## 2023-09-02 ENCOUNTER — Encounter: Payer: Self-pay | Admitting: Family Medicine

## 2023-09-02 LAB — BASIC METABOLIC PANEL
BUN/Creatinine Ratio: 11 — ABNORMAL LOW (ref 12–28)
BUN: 10 mg/dL (ref 8–27)
CO2: 23 mmol/L (ref 20–29)
Calcium: 9.4 mg/dL (ref 8.7–10.3)
Chloride: 100 mmol/L (ref 96–106)
Creatinine, Ser: 0.89 mg/dL (ref 0.57–1.00)
Glucose: 118 mg/dL — ABNORMAL HIGH (ref 70–99)
Potassium: 4.9 mmol/L (ref 3.5–5.2)
Sodium: 142 mmol/L (ref 134–144)
eGFR: 74 mL/min/{1.73_m2} (ref >59)

## 2023-09-02 LAB — MICROALBUMIN / CREATININE URINE RATIO
Creatinine, Urine: 181.4 mg/dL
Microalb/Creat Ratio: 13 mg/g{creat} (ref 0–29)
Microalbumin, Urine: 24.1 ug/mL

## 2023-09-02 LAB — TSH RFX ON ABNORMAL TO FREE T4: TSH: 0.984 u[IU]/mL (ref 0.450–4.500)

## 2023-09-15 ENCOUNTER — Ambulatory Visit: Payer: Medicaid Other | Admitting: Psychiatry

## 2023-09-22 ENCOUNTER — Telehealth: Payer: Self-pay | Admitting: Pharmacist

## 2023-09-22 DIAGNOSIS — F17200 Nicotine dependence, unspecified, uncomplicated: Secondary | ICD-10-CM

## 2023-09-22 NOTE — Telephone Encounter (Signed)
-----   Message from Cristal Don sent at 08/25/2023  3:01 PM EDT ----- Regarding: FW: Tobacco Intake Reduction - visit 2/25 PCP visit on 3/24 Quit  4/15  ? ----- Message ----- From: Abbey Abbe, RPH-CPP Sent: 08/25/2023  12:00 AM EDT To: Abbey Abbe, RPH-CPP Subject: Tobacco Intake Reduction - visit 2/25

## 2023-09-22 NOTE — Assessment & Plan Note (Signed)
 Patient contacted for follow/up of tobacco intake reduction / cessation attempt.   Since last contact patient reports she continues to smoke as many as 4-6 cigarettes per day.  She reports that she only takes 3 cigarettes out of the house at any time she leaves.   Continues to rates IMPORTANCE of quitting tobacco as high.   Most common triggers to use tobacco include; when smoking and talking with friends who are also smokers.

## 2023-09-22 NOTE — Telephone Encounter (Signed)
 Patient contacted for follow/up of tobacco intake reduction / cessation attempt.   Since last contact patient reports she continues to smoke as many as 4-6 cigarettes per day.  She reports that she only takes 3 cigarettes out of the house at any time she leaves.   Continues to rates IMPORTANCE of quitting tobacco as high.   Most common triggers to use tobacco include; when smoking and talking with friends who are also smokers.    Patient is participating in a Managed Medicaid Plan:  Yes  Total time with patient call and documentation of interaction: 12 minutes. Follow-up phone call planned: 1 month.

## 2023-09-23 NOTE — Telephone Encounter (Signed)
 Reviewed and agree with Dr Macky Lower plan.

## 2023-10-20 ENCOUNTER — Inpatient Hospital Stay: Payer: Medicaid Other | Attending: Psychiatry | Admitting: Psychiatry

## 2023-10-20 ENCOUNTER — Encounter: Payer: Self-pay | Admitting: Psychiatry

## 2023-10-20 VITALS — BP 144/60 | HR 60 | Temp 98.2°F | Resp 18 | Ht 64.0 in | Wt 383.6 lb

## 2023-10-20 DIAGNOSIS — J45909 Unspecified asthma, uncomplicated: Secondary | ICD-10-CM | POA: Diagnosis not present

## 2023-10-20 DIAGNOSIS — Z7985 Long-term (current) use of injectable non-insulin antidiabetic drugs: Secondary | ICD-10-CM | POA: Diagnosis not present

## 2023-10-20 DIAGNOSIS — N950 Postmenopausal bleeding: Secondary | ICD-10-CM | POA: Insufficient documentation

## 2023-10-20 DIAGNOSIS — F17210 Nicotine dependence, cigarettes, uncomplicated: Secondary | ICD-10-CM | POA: Insufficient documentation

## 2023-10-20 DIAGNOSIS — C541 Malignant neoplasm of endometrium: Secondary | ICD-10-CM | POA: Insufficient documentation

## 2023-10-20 DIAGNOSIS — Z79811 Long term (current) use of aromatase inhibitors: Secondary | ICD-10-CM | POA: Diagnosis not present

## 2023-10-20 DIAGNOSIS — Z6841 Body Mass Index (BMI) 40.0 and over, adult: Secondary | ICD-10-CM

## 2023-10-20 DIAGNOSIS — Z975 Presence of (intrauterine) contraceptive device: Secondary | ICD-10-CM | POA: Diagnosis not present

## 2023-10-20 DIAGNOSIS — E042 Nontoxic multinodular goiter: Secondary | ICD-10-CM | POA: Insufficient documentation

## 2023-10-20 DIAGNOSIS — J4541 Moderate persistent asthma with (acute) exacerbation: Secondary | ICD-10-CM

## 2023-10-20 NOTE — Progress Notes (Signed)
 Gynecologic Oncology Return Clinic Visit  Date of Service: 10/20/2023 Referring Provider: Amiel Balder, MD   Assessment & Plan: Deborah Benson is a 63 y.o. 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  Endometrial cancer: - Pt not previously a surgical candidate due to obesity and pulmonary status. Pt was admitted to the hospital for 5 days after her D&C due to hypoxia. Was previously unable to lay flat which limited her option for radiation treatment, however, now able to lay flat and overall improving respiratory status. - Continue at this time with IUD and letrozole . - Last CT scan in December 2024, stable overall NED.  Plan repeat in 6 months.  Ordered today. - Last EMB on 07/21/23 was benign (prior was disordered proliferative endometrium) - If inadequate treatment with current regimen, given improvement in respiratory status and weight loss, could reconsider definitive radiation treatment versus attempt at robotic hysterectomy. - Germline genetic testing negative. - Reviewed plan for continued every 3 month biopsies until 3 negative. Once she has 3 negative, could consider spacing out - Plan return visit in 3 months.    Morbid obesity: - Patient has been working on lifestyle changes to help with weight loss and has been on Trulicity .  lost over 80 pounds initially, weight now stable. -Continue to monitor.  Reviewed goal with patient of getting closer to 300 pounds prior to reconsidering hysterectomy as a definitive intervention for her endometrial cancer. Wt Readings from Last 5 Encounters:  10/20/23 (!) 383 lb 9.6 oz (174 kg)  09/01/23 (!) 379 lb 12.8 oz (172.3 kg)  08/05/23 (!) 383 lb 4.8 oz (173.9 kg)  07/21/23 (!) 388 lb (176 kg)  05/29/23 (!) 387 lb (175.5 kg)   Asthma, improving: - Last seen by pulmonology on  04/02/2023. - Planned followe-up in 6 months, due. Pt encouraged to call and schedule follow-up.  Thyroid  nodules, mediastinal nodule: - Unchanged mediastinal nodule at thoracic inlet on CT scan. - Per CT report, differential includes thyroid  nodule, ectopic thyroid  tissue, or thymoma. - Patient has referral in with endocrinology.  Had appointment in August 2024 but did not present.  Patient encouraged to reschedule. Number provided again for pt to call and reschedule   RTC 3 months.  Derrel Flies, MD Gynecologic Oncology   Medical Decision Making I personally spent  TOTAL 30 minutes face-to-face and non-face-to-face in the care of this patient, which includes all pre, intra, and post visit time on the date of service.  ----------------------- Reason for Visit: Surveillance  Treatment History: Oncology History Overview Note  MSI stable, ER 95%, PR 100% Genetics neg   Uterine cancer (HCC)  04/17/2022 Imaging   US  pelvis 1. Prominent abnormal endometrial thickening (41 mm). In the setting of post-menopausal bleeding, endometrial sampling is indicated to exclude carcinoma. If results are benign, sonohysterogram should be considered for focal lesion work-up.  2. Nonvisualization of the ovaries. No adnexal masses.   06/04/2022 Pathology Results   A. 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  See comment.    07/17/2022 Imaging   MRI pelvis 1. Maximal thickness of the endometrial canal approaches 2.5 cm, compared to gross thickening up to 4 cm on the previous exam. Presence is suspicious for residual tumor in the endometrium but shows improvement since previous imaging. Exam quality is  compromised by patient's body habitus. Depth of myometrial invasion is difficult to assess on the current study but there is certainly no gross invasion beyond  50%. If additional MRI follow-up is desired direct involvement from a radiologist for protocol prescription may be helpful to mitigate artifacts and maximize image quality. 2. IUD in place 3. 11 mm RIGHT pelvic sidewall lymph node other smaller lymph nodes in the pelvis. This finding is nonspecific but suspicious in light of gynecologic neoplasm. PET imaging could be considered for further evaluation.   07/22/2022 Initial Diagnosis   Uterine cancer (HCC)   08/21/2022 Imaging   Lack of intravenous contrast administration and photon attenuation related to patient habitus limit evaluation of the thoracic inlet and pelvis. Within this context:   1. IUD in place in the uterus. Pelvic structures not well evaluated on this examination. 2. Pelvic sidewall lymph node seen on prior MRI is not identified on this examination, likely secondary to artifact from photon attenuation. 3. Enlarged periportal lymph nodes measure up to 16 mm in short axis, nonspecific and commonly reactive. However metastatic nodal disease while not favored is not excluded. Consider attention on follow-up imaging. 4. Lobular enlargement of the thyroid  gland measuring up to 2.7 cm, suggest further evaluation with dedicated thyroid  ultrasound. 5. Soft tissue nodule anterior to the great vessels and subjacent to the thyroid  gland measuring 2.7 x 1.8 cm, is nonspecific possibly reflecting an enlarged lymph node or parathyroid adenoma. Consider further evaluation with contrast enhanced CT of the neck. 6. Stranding in the anterior abdominal wall, nonspecific but possibly reflecting cellulitis. 7.  Aortic Atherosclerosis (ICD10-I70.0).   08/21/2022 Cancer Staging   Staging form: Corpus Uteri - Carcinoma and Carcinosarcoma, AJCC 8th Edition - Clinical stage from 08/21/2022: Stage IIIC2 (cT1, cN2a, cM0) - Signed by Almeda Jacobs, MD on 08/21/2022 Stage prefix: Initial diagnosis    Genetic Testing   [facility] CancerNext-Expanded Panel+RNA was  Negative. Report date is 08/23/2022.  The CancerNext-Expanded gene panel offered by [facility] and includes sequencing, rearrangement, and RNA analysis for the following 71 genes: AIP, ALK, APC, ATM, AXIN2, BAP1, BARD1, BMPR1A, BRCA1, BRCA2, BRIP1, CDC73, CDH1, CDK4, CDKN1B, CDKN2A, CHEK2, CTNNA1, DICER1, FH, FLCN, KIF1B, LZTR1, MAX, MEN1, MET, MLH1, MSH2, MSH3, MSH6, MUTYH, NF1, NF2, NTHL1, PALB2, PHOX2B, PMS2, POT1, PRKAR1A, PTCH1, PTEN, RAD51C, RAD51D, RB1, RET, SDHA, SDHAF2, SDHB, SDHC, SDHD, SMAD4, SMARCA4, SMARCB1, SMARCE1, STK11, SUFU, TMEM127, TP53, TSC1, TSC2, and VHL (sequencing and deletion/duplication); EGFR, EGLN1, HOXB13, KIT, MITF, PDGFRA, POLD1, and POLE (sequencing only); EPCAM and GREM1 (deletion/duplication only).    08/23/2022 -  Anti-estrogen oral therapy   She is started on Femara    08/29/2022 Imaging   1. Thyromegaly with findings suggestive of multinodular goiter. 2. Nodules labeled #1 and #4 both meet imaging criteria to recommend percutaneous sampling as indicated. 3. Nodule #3 meets imaging criteria to recommend a 1 year follow-up.  4. Note, the substernal nodule demonstrated on recent chest CT was not visualized sonographically due to substernal location. Further evaluation could be performed with nuclear medicine thyroid  uptake and scan (to evaluate if this substernal tissue represents exophytic or ectopic thyroid  parenchyma) versus contrast-enhanced neck CT as indicated.   11/25/2022 Tumor Marker   Patient's tumor was tested for the following markers: CA-125. Results of the tumor marker test revealed 9.2.   11/25/2022 Imaging   CT CHEST ABDOMEN PELVIS W CONTRAST  Result Date: 11/22/2022 CLINICAL DATA:  Cervical cancer, assess treatment response, ongoing oral chemotherapy * Tracking Code: BO * EXAM: CT  CHEST, ABDOMEN, AND PELVIS WITH CONTRAST TECHNIQUE: Multidetector CT imaging of the chest, abdomen and pelvis was performed following the standard protocol during bolus  administration of intravenous contrast. RADIATION DOSE REDUCTION: This exam was performed according to the departmental dose-optimization program which includes automated exposure control, adjustment of the mA and/or kV according to patient size and/or use of iterative reconstruction technique. CONTRAST:  OMNIPAQUE  IOHEXOL  300 MG/ML SOLN additional oral enteric contrast COMPARISON:  CT chest abdomen pelvis, 08/19/2022, thyroid  ultrasound, 08/29/2022 FINDINGS: Examination is significantly limited by patient body habitus and related photopenia. CT CHEST FINDINGS Cardiovascular: No significant vascular findings. Mild cardiomegaly. No pericardial effusion. Mediastinum/Nodes: Unchanged anterior mediastinal nodule just below the thoracic inlet measuring 2.9 x 1.5 cm (series 2, image 16). No enlarged mediastinal, hilar, or axillary lymph nodes. Heterogeneously enlarged thyroid  gland, previously assessed by dedicated thyroid  ultrasound. This has been evaluated on previous imaging. (ref: J Am Coll Radiol. 2015 Feb;12(2): 143-50). Trachea, and esophagus demonstrate no significant findings. Lungs/Pleura: Unchanged bandlike scarring or atelectasis throughout the lungs. No pleural effusion or pneumothorax. Musculoskeletal: No chest wall abnormality. No acute osseous findings. CT ABDOMEN PELVIS FINDINGS Hepatobiliary: Hepatomegaly, maximum coronal span 20.3 cm. No solid liver abnormality is seen. No gallstones, gallbladder wall thickening, or biliary dilatation. Pancreas: Unremarkable. No pancreatic ductal dilatation or surrounding inflammatory changes. Spleen: Normal in size without significant abnormality. Adrenals/Urinary Tract: Adrenal glands are unremarkable. Kidneys are normal, without renal calculi, solid lesion, or hydronephrosis. Bladder is unremarkable. Stomach/Bowel: Stomach is within normal limits. Appendix appears normal. No evidence of bowel wall thickening, distention, or inflammatory changes.  Vascular/Lymphatic: Aortic atherosclerosis. Unchanged enlarged portacaval lymph node measuring up to 2.8 x 1.6 cm (series 2, image 67). Unchanged prominent inguinal lymph nodes (series 2, image 115). Reproductive: IUD present in the fundal endometrial cavity (series 2, image 106). Evaluation of the pelvic structures very limited by body habitus and photopenia; no obvious cervical mass appreciated. Other: No abdominal wall hernia or abnormality. No ascites. Musculoskeletal: No acute osseous findings. IMPRESSION: 1. Examination is significantly limited by patient body habitus and related photopenia. 2. Evaluation of the pelvic structures particular is very limited; no obvious cervical mass appreciated. 3. Unchanged enlarged portacaval lymph node measuring up to 2.8 x 1.6 cm, as previously reported, nonspecific and most likely reactive. Unchanged prominent inguinal lymph nodes. No specific evidence of nodal metastatic disease. Continued attention on follow-up. 4. Unchanged anterior mediastinal nodule just below the thoracic inlet measuring 2.9 x 1.5 cm. This was not visualized on recent prior thyroid  ultrasound, but nonetheless may reflect a substernal thyroid  nodule or ectopic thyroid  tissue. Alternately this could reflect an anterior mediastinal mass such as a thymoma. At either rate, this does not likely reflect metastatic disease cervical cancer. Contrast enhanced MR of the chest or nuclear thyroid  uptake scan may be helpful to further assess. 5. Hepatomegaly. Aortic Atherosclerosis (ICD10-I70.0). Electronically Signed   By: Fredricka Jenny M.D.   On: 11/22/2022 22:31      12/30/2022 Pathology Results   A. ENDOMETRIAL BIOPSY:  Focal complex atypical hyperplasia (EIN)  Background marked progestational effect    04/14/2023 Pathology Results   A. ENDOMETRIAL BIOPSY:  -  Disordered proliferative endometrium and pseudodecidualized stroma  with chronic inflammatory cells and hemosiderin-laden macrophages.     07/21/2023 Pathology Results   A. ENDOMETRIAL BIOPSY:       Benign inactive endometrium and pseudodecidualized stroma, chronic  inflammatory cells and hemosiderin-laden macrophages.       Negative for hyperplasia, atypia or malignancy.  Interval History: Patient presents with her cousin today.  Overall doing well.  Notes ongoing rare light vaginal bleeding, pink on the toilet paper when she wipes every so often.  No heavy bleeding.  Persistent decreased appetite with Trulicity  but no change for her.  Using her albuterol  inhaler about 3 times per week.  Continues with cigarette use about 7 cigarettes a day.  Otherwise denies new abdominal/pelvic pain, change in bowel or bladder habits, bloating, nausea/vomiting.    Past Medical/Surgical History: Past Medical History:  Diagnosis Date   Arthritis    Asthma    Cancer (HCC)    ovarian cancer   Depression    Diabetes mellitus without complication (HCC)    oral meds only   Gout    HTN (hypertension)    "borderline" med was stopped by pt- MD aware.   Neuromuscular disorder (HCC)    neuropathy -hands/ feet   Neuropathy    Obesity    BMI >76.6   Pneumonia     Past Surgical History:  Procedure Laterality Date   FOOT SURGERY     due to needle break in foot   HYSTEROSCOPY WITH D & C N/A 06/04/2022   Procedure: DILATATION AND CURETTAGE;  Surgeon: Derrel Flies, MD;  Location: [facility];  Service: Gynecology;  Laterality: N/A;   INTRAUTERINE DEVICE (IUD) INSERTION N/A 06/04/2022   Procedure: MIRENA  INTRAUTERINE DEVICE (IUD) INSERTION;  Surgeon: Derrel Flies, MD;  Location: [facility];  Service: Gynecology;  Laterality: N/A;   MULTIPLE TOOTH EXTRACTIONS     TONSILLECTOMY      Family History  Problem Relation Age of Onset   Heart attack Mother    Stroke Mother    Gout Mother    Hypertension Mother    Lung cancer Father 4 - 58       smoked   Glaucoma Father    Heart disease Sister        fluid    Other Sister         spinal disease   Throat cancer Sister    Diabetes Brother    Stroke Brother    Ovarian cancer Maternal Grandmother    Dementia Maternal Grandfather    Prostate cancer Maternal Uncle    Glaucoma Other        nephew   Seizures Other        niece   Colon cancer Neg Hx    Breast cancer Neg Hx    Endometrial cancer Neg Hx    Pancreatic cancer Neg Hx     Social History   Socioeconomic History   Marital status: Single    Spouse name: Not on file   Number of children: 0   Years of education: Not on file   Highest education level: GED or equivalent  Occupational History   Occupation: disabled  Tobacco Use   Smoking status: Every Day    Current packs/day: 0.40    Average packs/day: 0.4 packs/day for 45.4 years (18.1 ttl pk-yrs)    Types: Cigarettes    Start date: 06/10/1978   Smokeless tobacco: Never   Tobacco comments:    down to 5 per day - Previous 7-10  Vaping Use   Vaping status: Never Used  Substance and Sexual Activity   Alcohol use: No    Alcohol/week: 0.0 standard drinks of alcohol   Drug use: No   Sexual activity: Not Currently  Other Topics Concern   Not on file  Social History Narrative  Past Medical History:      Irregular menstrual periods      Asthma         Social History:      Disabled?,  Weight and OA; Moved to [place] 6 yrs ago.  Single         Social Drivers of Health   Financial Resource Strain: Low Risk  (02/23/2023)   Overall Financial Resource Strain (CARDIA)    Difficulty of Paying Living Expenses: Not very hard  Food Insecurity: Food Insecurity Present (02/23/2023)   Hunger Vital Sign    Worried About Running Out of Food in the Last Year: Sometimes true    Ran Out of Food in the Last Year: Patient declined  Transportation Needs: No Transportation Needs (02/23/2023)   PRAPARE - Administrator, Civil Service (Medical): No    Lack of Transportation (Non-Medical): No  Physical Activity: Unknown (02/23/2023)   Exercise Vital  Sign    Days of Exercise per Week: Patient declined    Minutes of Exercise per Session: Not on file  Stress: No Stress Concern Present (02/23/2023)   [employer] of Occupational Health - Occupational Stress Questionnaire    Feeling of Stress : Only a little  Social Connections: Unknown (02/23/2023)   Social Connection and Isolation Panel [NHANES]    Frequency of Communication with Friends and Family: More than three times a week    Frequency of Social Gatherings with Friends and Family: More than three times a week    Attends Religious Services: More than 4 times per year    Active Member of [employer] or Organizations: Yes    Attends Engineer, structural: More than 4 times per year    Marital Status: Patient declined    Current Medications:  Current Outpatient Medications:    albuterol  (PROVENTIL ) (2.5 MG/3ML) 0.083% nebulizer solution, Take 3 mLs (2.5 mg total) by nebulization every 6 (six) hours as needed for wheezing or shortness of breath., Disp: 150 mL, Rfl: 1   cholecalciferol (VITAMIN D3) 25 MCG (1000 UNIT) tablet, Take 1,000 Units by mouth daily., Disp: , Rfl:    Dulaglutide  (TRULICITY ) 4.5 MG/0.5ML SOAJ, Inject 4.5 mg as directed once a week., Disp: 8 mL, Rfl: 3   nystatin  (MYCOSTATIN /NYSTOP ) powder, Apply 1 Application topically 3 (three) times daily as needed., Disp: 60 g, Rfl: 5  Review of Symptoms: Complete 10-system review is positive for: Appetite changes, urinary frequency, light vaginal bleeding, hot flashes, itching  Physical Exam: BP (!) 144/60 (BP Location: Left Wrist, Patient Position: Sitting) Comment: CMA notified  Pulse 60   Temp 98.2 F (36.8 C) (Oral)   Resp 18   Ht 5\' 4"  (1.626 m)   Wt (!) 383 lb 9.6 oz (174 kg)   SpO2 98%   BMI 65.84 kg/m  General: Alert, oriented, no acute distress. HEENT: Normocephalic, atraumatic. Sclera anicteric.  Chest: Normal work of breathing. CTAB Cardiovascular: Regular rate and rhythm, no murmurs. Abdomen:  Soft, nontender.  Normoactive bowel sounds.  No masses appreciated.   Extremities: Grossly normal range of motion.  Warm, well perfused.  No edema bilaterally. Skin: No rashes or lesions noted. Lymphatics: No cervical, supraclavicular, or inguinal adenopathy. GU: Normal appearing external genitalia without erythema, excoriation, or lesions.  Speculum exam reveals normal cervix with IUD strings visualized. Bimanual exam reveals normal cervix. Uterus and adnexa unable to be palpated due to body habitus. Exam chaperoned by Kimberly [place], CMA    EMB Procedure: After appropriate verbal informed  consent was obtained, a timeout was performed. A sterile speculum was placed in the vagina, and the area was cleaned with betadine x3. A single-tooth tenaculum was placed on the anterior lip of the cervix.  A cervical block was placed with 10 mL of 2% lidocaine  at 4 and 8:00.  An endometrial biopsy pipelle was advanced carefully to the uterine fundus which sounded to 9cm. An adequate sample was obtained over 1 pass. The tenaculum was removed, and tenaculum sites were noted to be hemostatic. The speculum was removed from the vagina. The patient tolerated the procedure well.   UPT: not indicated   Laboratory & Radiologic Studies: Surgical pathology (07/21/23): A. ENDOMETRIAL BIOPSY:       Benign inactive endometrium and pseudodecidualized stroma, chronic  inflammatory cells and hemosiderin-laden macrophages.       Negative for hyperplasia, atypia or malignancy.

## 2023-10-20 NOTE — Patient Instructions (Addendum)
 It was a pleasure to see you in clinic today. - We will let you know the results of the biopsy from today - Return visit planned for 27mo with another biopsy at that time. - Recommend that you call and schedule follow-up with pulmonology and call to schedule an appointment with endocrinology in regards to your thyroid  nodule.  [facility] in [place] with phone # [phone]   Thank you very much for allowing me to provide care for you today.  I appreciate your confidence in choosing our Gynecologic Oncology team at [facility].  If you have any questions about your visit today please call our office or send us  a MyChart message and we will get back to you as soon as possible.

## 2023-10-22 ENCOUNTER — Ambulatory Visit: Payer: Self-pay | Admitting: Psychiatry

## 2023-10-22 LAB — SURGICAL PATHOLOGY

## 2023-10-28 ENCOUNTER — Encounter (HOSPITAL_COMMUNITY): Payer: Self-pay

## 2023-10-28 ENCOUNTER — Ambulatory Visit (HOSPITAL_COMMUNITY)
Admission: RE | Admit: 2023-10-28 | Discharge: 2023-10-28 | Disposition: A | Discharge status: Home / Self Care | Source: Ambulatory Visit | Attending: Psychiatry | Admitting: Psychiatry

## 2023-10-28 DIAGNOSIS — C541 Malignant neoplasm of endometrium: Secondary | ICD-10-CM

## 2023-10-28 MED ORDER — SODIUM CHLORIDE (PF) 0.9 % IJ SOLN
INTRAMUSCULAR | Status: AC
  Filled 2023-10-28: qty 50

## 2023-10-28 MED ORDER — IOHEXOL 300 MG/ML  SOLN: 100.0000 mL | Freq: Once | INTRAMUSCULAR | Status: DC | PRN

## 2023-10-29 ENCOUNTER — Telehealth: Payer: Self-pay | Admitting: Pharmacist

## 2023-10-29 NOTE — Telephone Encounter (Signed)
 Attempted to contact patient for follow-up of tobacco use - cessation planning.   Left HIPAA compliant voice mail requesting call back to direct phone: [phone]  Total time with patient call and documentation of interaction: 6 minutes.  Follow-up phone call planned: 1-2 month

## 2023-10-29 NOTE — Telephone Encounter (Signed)
-----   Message from Cristal Don sent at 09/22/2023  4:23 PM EDT ----- Regarding: Quit? Smoking 6 or less 4/14

## 2023-11-05 ENCOUNTER — Telehealth: Payer: Self-pay

## 2023-11-05 NOTE — Telephone Encounter (Signed)
 Spoke with patient to try and reschedule the CT scan so that she can do the oral contrast.  Patient stated that her mother has had a stroke and she is back and forth with her in the hospital right now.  She will call back in about a week to see about rescheduling that.

## 2023-11-11 ENCOUNTER — Telehealth: Payer: Self-pay

## 2023-11-11 NOTE — Telephone Encounter (Signed)
 Left message on voicemail for patient to call back and see about rescheduling her CT scan.

## 2023-11-27 ENCOUNTER — Telehealth: Payer: Self-pay

## 2023-11-27 NOTE — Telephone Encounter (Signed)
 Spoke with patient and rescheduled CT for 7/1 at 5:00pm.. check in at 4:45pm.  Patient confirmed

## 2023-12-09 ENCOUNTER — Ambulatory Visit (HOSPITAL_COMMUNITY)

## 2023-12-16 ENCOUNTER — Ambulatory Visit (HOSPITAL_COMMUNITY)
Admission: RE | Admit: 2023-12-16 | Discharge: 2023-12-16 | Disposition: A | Discharge status: Home / Self Care | Source: Ambulatory Visit | Attending: Psychiatry | Admitting: Psychiatry

## 2023-12-16 DIAGNOSIS — C55 Malignant neoplasm of uterus, part unspecified: Secondary | ICD-10-CM | POA: Diagnosis present

## 2023-12-16 DIAGNOSIS — C541 Malignant neoplasm of endometrium: Secondary | ICD-10-CM | POA: Insufficient documentation

## 2023-12-16 LAB — POCT I-STAT CREATININE: Creatinine, Ser: 0.9 mg/dL (ref 0.44–1.00)

## 2023-12-16 MED ORDER — IOHEXOL 300 MG/ML  SOLN
100.0000 mL | Freq: Once | INTRAMUSCULAR | Status: AC | PRN
  Administered 2023-12-16: 100 mL via INTRAVENOUS

## 2024-01-01 ENCOUNTER — Telehealth: Payer: Self-pay | Admitting: Pharmacist

## 2024-01-01 NOTE — Telephone Encounter (Signed)
-----   Message from Maude Lagos sent at 10/29/2023  3:26 PM EDT ----- Regarding: FW: Quit? Quit? ----- Message ----- From: Laparis Durrett G, RPH-CPP Sent: 10/23/2023  12:00 AM EDT To: Maude KANDICE Lagos, RPH-CPP Subject: Quit?                                          Smoking 6 or less 4/14

## 2024-01-01 NOTE — Telephone Encounter (Signed)
 Attempted to contact patient for follow-up of tobacco intake reduction / cessation.   Left HIPAA compliant voice mail requesting call back to direct phone: [phone]  Total time with patient call and documentation of interaction: 4 minutes.  Additional follow-up phone call planned: 1 weeks

## 2024-01-05 ENCOUNTER — Telehealth: Payer: Self-pay | Admitting: Pharmacist

## 2024-01-05 DIAGNOSIS — F17200 Nicotine dependence, unspecified, uncomplicated: Secondary | ICD-10-CM

## 2024-01-05 NOTE — Telephone Encounter (Signed)
 Patient contacts office to report an updated on her tobacco use.  She reports that she and a friend committed to quit smoking and she successfully quit for about 2 weeks.  Her friend has remained quit.    Patient reports prior to this quit attempt she was smoking ~ 7 per day.  Now, patient has reduced her smoking to ~ 4 cigarettes or less per day AND states she gives away cigarettes when buying a new pack and that 10 cigarettes may last her up to 4 days.   She has a plan to quit by early August when she will be at his house in [place]  where smoking is OFF limits.  She was happy to accept an offer of phone follow-up in Mid August to check on progress.   Total time with patient call and documentation of interaction: 12 minutes.

## 2024-01-05 NOTE — Assessment & Plan Note (Signed)
 Patient contacts office to report an updated on her tobacco use.  She reports that she and a friend committed to quit smoking and she successfully quit for about 2 weeks.  Her friend has remained quit.    Patient reports prior to this quit attempt she was smoking ~ 7 per day.  Now, patient has reduced her smoking to ~ 4 cigarettes or less per day AND states she gives away cigarettes when buying a new pack and that 10 cigarettes may last her up to 4 days.   She has a plan to quit by early August when she will be at his house in [place]  where smoking is OFF limits.

## 2024-01-06 NOTE — Telephone Encounter (Signed)
 Reviewed and agree with Dr Macky Lower plan.

## 2024-01-12 ENCOUNTER — Telehealth: Payer: Self-pay

## 2024-01-12 NOTE — Telephone Encounter (Signed)
 Patient called and had her appointment rescheduled from 8/11 to 8/25.SABRA patient confirmed time and date.

## 2024-01-14 NOTE — Telephone Encounter (Signed)
 Deborah Benson

## 2024-01-19 ENCOUNTER — Ambulatory Visit: Admitting: Psychiatry

## 2024-01-29 ENCOUNTER — Encounter: Payer: Self-pay | Admitting: Psychiatry

## 2024-02-01 NOTE — Progress Notes (Unsigned)
 Gynecologic Oncology Return Clinic Visit  Date of Service: 02/02/2024 Referring Provider: Ronal Pinal, MD   Assessment & Plan: Deborah Benson is a 63 y.o. 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  Endometrial cancer: - Pt not previously a surgical candidate due to obesity and pulmonary status. Pt was admitted to the hospital for 5 days after her D&C due to hypoxia. Was previously unable to lay flat which limited her option for radiation treatment, however, now able to lay flat and overall improving respiratory status. - Continue at this time with IUD and letrozole . - Last CT 12/16/23, stable overall NED. Results reviewed. - Repeat one more CT in 19mo then space out to yearly. Can order at next visit.  - Last EMB on 10/20/23 benign - If inadequate treatment with current regimen, given improvement in respiratory status and weight loss, could reconsider definitive radiation treatment versus attempt at robotic hysterectomy. - Germline genetic testing negative. - Reviewed plan for continued every 3 month biopsies until 3 negative (2 negative so far). Once she has 3 negative, could consider spacing out. Biopsy collected today.  - Plan return visit in 3 months.   Morbid obesity: - Patient has been working on lifestyle changes to help with weight loss and has been on Trulicity .  lost over 80 pounds initially, weight now stable.  -Continue to monitor.  Reviewed goal with patient of getting closer to 300 pounds prior to reconsidering hysterectomy as a definitive intervention for her endometrial cancer. Wt Readings from Last 5 Encounters:  02/02/24 (!) 389 lb 3.2 oz (176.5 kg)  10/20/23 (!) 383 lb 9.6 oz (174 kg)  09/01/23 (!) 379 lb 12.8 oz (172.3 kg)  08/05/23 (!) 383 lb 4.8 oz (173.9 kg)  07/21/23 (!) 388 lb (176 kg)   Asthma,  improving: - Last seen by pulmonology on 04/02/2023. - Planned follow-up in 6 months, due. Pt encouraged to call and schedule follow-up especially given that she has been using her albuterol  more frequently recently - Reviewed that asthma control will also impact safe ability to consider hysterectomy in the future. - Cigarette cessation encouraged.  Thyroid  nodules, mediastinal nodule: - Unchanged mediastinal nodule at thoracic inlet on CT scan. - Per CT report, differential includes thyroid  nodule, ectopic thyroid  tissue, or thymoma. - Previously referred to endocrinology. Did not present. New referral placed. Pt encouraged to schedule.   RTC 3 months.  Hoy Masters, MD Gynecologic Oncology   Medical Decision Making I personally spent  TOTAL 30 minutes face-to-face and non-face-to-face in the care of this patient, which includes all pre, intra, and post visit time on the date of service.  ----------------------- Reason for Visit: Surveillance  Treatment History: Oncology History Overview Note  MSI stable, ER 95%, PR 100% Genetics neg   Uterine cancer (HCC)  04/17/2022 Imaging   US  pelvis 1. Prominent abnormal endometrial thickening (41 mm). In the setting of post-menopausal bleeding, endometrial sampling is indicated to exclude carcinoma. If results are benign, sonohysterogram should be considered for focal lesion work-up.  2. Nonvisualization of the ovaries. No adnexal masses.   06/04/2022 Pathology Results   A. 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  See comment.    07/17/2022 Imaging   MRI pelvis 1. Maximal thickness of the endometrial canal approaches 2.5 cm, compared to  gross thickening up to 4 cm on the previous exam. Presence is suspicious for residual tumor in the endometrium but shows improvement since previous imaging.  Exam quality is compromised by patient's body habitus. Depth of myometrial invasion is difficult to assess on the current study but there is certainly no gross invasion beyond 50%. If additional MRI follow-up is desired direct involvement from a radiologist for protocol prescription may be helpful to mitigate artifacts and maximize image quality. 2. IUD in place 3. 11 mm RIGHT pelvic sidewall lymph node other smaller lymph nodes in the pelvis. This finding is nonspecific but suspicious in light of gynecologic neoplasm. PET imaging could be considered for further evaluation.   07/22/2022 Initial Diagnosis   Uterine cancer (HCC)   08/21/2022 Imaging   Lack of intravenous contrast administration and photon attenuation related to patient habitus limit evaluation of the thoracic inlet and pelvis. Within this context:   1. IUD in place in the uterus. Pelvic structures not well evaluated on this examination. 2. Pelvic sidewall lymph node seen on prior MRI is not identified on this examination, likely secondary to artifact from photon attenuation. 3. Enlarged periportal lymph nodes measure up to 16 mm in short axis, nonspecific and commonly reactive. However metastatic nodal disease while not favored is not excluded. Consider attention on follow-up imaging. 4. Lobular enlargement of the thyroid  gland measuring up to 2.7 cm, suggest further evaluation with dedicated thyroid  ultrasound. 5. Soft tissue nodule anterior to the great vessels and subjacent to the thyroid  gland measuring 2.7 x 1.8 cm, is nonspecific possibly reflecting an enlarged lymph node or parathyroid adenoma. Consider further evaluation with contrast enhanced CT of the neck. 6. Stranding in the anterior abdominal wall, nonspecific but possibly reflecting cellulitis. 7.  Aortic Atherosclerosis (ICD10-I70.0).   08/21/2022 Cancer Staging   Staging form: Corpus Uteri - Carcinoma and Carcinosarcoma, AJCC 8th Edition - Clinical stage from  08/21/2022: Stage IIIC2 (cT1, cN2a, cM0) - Signed by Lonn Hicks, MD on 08/21/2022 Stage prefix: Initial diagnosis    Genetic Testing   [facility] CancerNext-Expanded Panel+RNA was Negative. Report date is 08/23/2022.  The CancerNext-Expanded gene panel offered by [facility] and includes sequencing, rearrangement, and RNA analysis for the following 71 genes: AIP, ALK, APC, ATM, AXIN2, BAP1, BARD1, BMPR1A, BRCA1, BRCA2, BRIP1, CDC73, CDH1, CDK4, CDKN1B, CDKN2A, CHEK2, CTNNA1, DICER1, FH, FLCN, KIF1B, LZTR1, MAX, MEN1, MET, MLH1, MSH2, MSH3, MSH6, MUTYH, NF1, NF2, NTHL1, PALB2, PHOX2B, PMS2, POT1, PRKAR1A, PTCH1, PTEN, RAD51C, RAD51D, RB1, RET, SDHA, SDHAF2, SDHB, SDHC, SDHD, SMAD4, SMARCA4, SMARCB1, SMARCE1, STK11, SUFU, TMEM127, TP53, TSC1, TSC2, and VHL (sequencing and deletion/duplication); EGFR, EGLN1, HOXB13, KIT, MITF, PDGFRA, POLD1, and POLE (sequencing only); EPCAM and GREM1 (deletion/duplication only).    08/23/2022 -  Anti-estrogen oral therapy   She is started on Femara    08/29/2022 Imaging   1. Thyromegaly with findings suggestive of multinodular goiter. 2. Nodules labeled #1 and #4 both meet imaging criteria to recommend percutaneous sampling as indicated. 3. Nodule #3 meets imaging criteria to recommend a 1 year follow-up.  4. Note, the substernal nodule demonstrated on recent chest CT was not visualized sonographically due to substernal location. Further evaluation could be performed with nuclear medicine thyroid  uptake and scan (to evaluate if this substernal tissue represents exophytic or ectopic thyroid  parenchyma) versus contrast-enhanced neck CT as indicated.   11/25/2022 Tumor Marker   Patient's tumor was tested for the following markers: CA-125. Results of the tumor marker test revealed 9.2.   11/25/2022 Imaging  CT CHEST ABDOMEN PELVIS W CONTRAST  Result Date: 11/22/2022 CLINICAL DATA:  Cervical cancer, assess treatment response, ongoing oral chemotherapy * Tracking Code: BO *  EXAM: CT CHEST, ABDOMEN, AND PELVIS WITH CONTRAST TECHNIQUE: Multidetector CT imaging of the chest, abdomen and pelvis was performed following the standard protocol during bolus administration of intravenous contrast. RADIATION DOSE REDUCTION: This exam was performed according to the departmental dose-optimization program which includes automated exposure control, adjustment of the mA and/or kV according to patient size and/or use of iterative reconstruction technique. CONTRAST:  OMNIPAQUE  IOHEXOL  300 MG/ML SOLN additional oral enteric contrast COMPARISON:  CT chest abdomen pelvis, 08/19/2022, thyroid  ultrasound, 08/29/2022 FINDINGS: Examination is significantly limited by patient body habitus and related photopenia. CT CHEST FINDINGS Cardiovascular: No significant vascular findings. Mild cardiomegaly. No pericardial effusion. Mediastinum/Nodes: Unchanged anterior mediastinal nodule just below the thoracic inlet measuring 2.9 x 1.5 cm (series 2, image 16). No enlarged mediastinal, hilar, or axillary lymph nodes. Heterogeneously enlarged thyroid  gland, previously assessed by dedicated thyroid  ultrasound. This has been evaluated on previous imaging. (ref: J Am Coll Radiol. 2015 Feb;12(2): 143-50). Trachea, and esophagus demonstrate no significant findings. Lungs/Pleura: Unchanged bandlike scarring or atelectasis throughout the lungs. No pleural effusion or pneumothorax. Musculoskeletal: No chest wall abnormality. No acute osseous findings. CT ABDOMEN PELVIS FINDINGS Hepatobiliary: Hepatomegaly, maximum coronal span 20.3 cm. No solid liver abnormality is seen. No gallstones, gallbladder wall thickening, or biliary dilatation. Pancreas: Unremarkable. No pancreatic ductal dilatation or surrounding inflammatory changes. Spleen: Normal in size without significant abnormality. Adrenals/Urinary Tract: Adrenal glands are unremarkable. Kidneys are normal, without renal calculi, solid lesion, or hydronephrosis. Bladder is  unremarkable. Stomach/Bowel: Stomach is within normal limits. Appendix appears normal. No evidence of bowel wall thickening, distention, or inflammatory changes. Vascular/Lymphatic: Aortic atherosclerosis. Unchanged enlarged portacaval lymph node measuring up to 2.8 x 1.6 cm (series 2, image 67). Unchanged prominent inguinal lymph nodes (series 2, image 115). Reproductive: IUD present in the fundal endometrial cavity (series 2, image 106). Evaluation of the pelvic structures very limited by body habitus and photopenia; no obvious cervical mass appreciated. Other: No abdominal wall hernia or abnormality. No ascites. Musculoskeletal: No acute osseous findings. IMPRESSION: 1. Examination is significantly limited by patient body habitus and related photopenia. 2. Evaluation of the pelvic structures particular is very limited; no obvious cervical mass appreciated. 3. Unchanged enlarged portacaval lymph node measuring up to 2.8 x 1.6 cm, as previously reported, nonspecific and most likely reactive. Unchanged prominent inguinal lymph nodes. No specific evidence of nodal metastatic disease. Continued attention on follow-up. 4. Unchanged anterior mediastinal nodule just below the thoracic inlet measuring 2.9 x 1.5 cm. This was not visualized on recent prior thyroid  ultrasound, but nonetheless may reflect a substernal thyroid  nodule or ectopic thyroid  tissue. Alternately this could reflect an anterior mediastinal mass such as a thymoma. At either rate, this does not likely reflect metastatic disease cervical cancer. Contrast enhanced MR of the chest or nuclear thyroid  uptake scan may be helpful to further assess. 5. Hepatomegaly. Aortic Atherosclerosis (ICD10-I70.0). Electronically Signed   By: Marolyn JONETTA Jaksch M.D.   On: 11/22/2022 22:31      12/30/2022 Pathology Results   A. ENDOMETRIAL BIOPSY:  Focal complex atypical hyperplasia (EIN)  Background marked progestational effect    04/14/2023 Pathology Results   A.  ENDOMETRIAL BIOPSY:  -  Disordered proliferative endometrium and pseudodecidualized stroma  with chronic inflammatory cells and hemosiderin-laden macrophages.    07/21/2023 Pathology Results   A. ENDOMETRIAL BIOPSY:  Benign inactive endometrium and pseudodecidualized stroma, chronic  inflammatory cells and hemosiderin-laden macrophages.       Negative for hyperplasia, atypia or malignancy.      Interval History: Patient presents with her cousin today.  Denies bleeding.  No bladder or bowel issues.  Still using Trulicity .  Currently needing her albuterol  inhaler every other day or so.  Smoking about 6 cigarettes a day.  She was able to stop for about 9 days but then resumed.  Working with her doctor on this.  Does report feeling of like a muscle spasm on the bottom of her stomach for couple months.  Rest sometimes makes it better and ibuprofen sometimes makes it better.    Past Medical/Surgical History: Past Medical History:  Diagnosis Date   Arthritis    Asthma    Cancer (HCC)    ovarian cancer   Depression    Diabetes mellitus without complication (HCC)    oral meds only   Gout    HTN (hypertension)    borderline med was stopped by pt- MD aware.   Neuromuscular disorder (HCC)    neuropathy -hands/ feet   Neuropathy    Obesity    BMI >76.6   Pneumonia     Past Surgical History:  Procedure Laterality Date   FOOT SURGERY     due to needle break in foot   HYSTEROSCOPY WITH D & C N/A 06/04/2022   Procedure: DILATATION AND CURETTAGE;  Surgeon: Eldonna Mays, MD;  Location: [facility];  Service: Gynecology;  Laterality: N/A;   INTRAUTERINE DEVICE (IUD) INSERTION N/A 06/04/2022   Procedure: MIRENA  INTRAUTERINE DEVICE (IUD) INSERTION;  Surgeon: Eldonna Mays, MD;  Location: [facility];  Service: Gynecology;  Laterality: N/A;   MULTIPLE TOOTH EXTRACTIONS     TONSILLECTOMY      Family History  Problem Relation Age of Onset   Heart attack Mother    Stroke Mother    Gout  Mother    Hypertension Mother    Lung cancer Father 18 - 57       smoked   Glaucoma Father    Heart disease Sister        fluid    Other Sister        spinal disease   Throat cancer Sister    Diabetes Brother    Stroke Brother    Ovarian cancer Maternal Grandmother    Dementia Maternal Grandfather    Prostate cancer Maternal Uncle    Glaucoma Other        nephew   Seizures Other        niece   Colon cancer Neg Hx    Breast cancer Neg Hx    Endometrial cancer Neg Hx    Pancreatic cancer Neg Hx     Social History   Socioeconomic History   Marital status: Single    Spouse name: Not on file   Number of children: 0   Years of education: Not on file   Highest education level: GED or equivalent  Occupational History   Occupation: disabled  Tobacco Use   Smoking status: Every Day    Current packs/day: 0.40    Average packs/day: 0.4 packs/day for 45.6 years (18.3 ttl pk-yrs)    Types: Cigarettes    Start date: 06/10/1978   Smokeless tobacco: Never   Tobacco comments:    down to 5 per day - Previous 7-10  Vaping Use   Vaping status: Never Used  Substance and Sexual Activity  Alcohol use: No    Alcohol/week: 0.0 standard drinks of alcohol   Drug use: No   Sexual activity: Not Currently  Other Topics Concern   Not on file  Social History Narrative         Past Medical History:      Irregular menstrual periods      Asthma         Social History:      Disabled?,  Weight and OA; Moved to [place] 6 yrs ago.  Single         Social Drivers of Health   Financial Resource Strain: Low Risk  (02/23/2023)   Overall Financial Resource Strain (CARDIA)    Difficulty of Paying Living Expenses: Not very hard  Food Insecurity: Food Insecurity Present (02/23/2023)   Hunger Vital Sign    Worried About Running Out of Food in the Last Year: Sometimes true    Ran Out of Food in the Last Year: Patient declined  Transportation Needs: No Transportation Needs (02/23/2023)   PRAPARE -  Administrator, Civil Service (Medical): No    Lack of Transportation (Non-Medical): No  Physical Activity: Unknown (02/23/2023)   Exercise Vital Sign    Days of Exercise per Week: Patient declined    Minutes of Exercise per Session: Not on file  Stress: No Stress Concern Present (02/23/2023)   [employer] of Occupational Health - Occupational Stress Questionnaire    Feeling of Stress : Only a little  Social Connections: Unknown (02/23/2023)   Social Connection and Isolation Panel    Frequency of Communication with Friends and Family: More than three times a week    Frequency of Social Gatherings with Friends and Family: More than three times a week    Attends Religious Services: More than 4 times per year    Active Member of [employer] or Organizations: Yes    Attends Engineer, structural: More than 4 times per year    Marital Status: Patient declined    Current Medications:  Current Outpatient Medications:    albuterol  (PROVENTIL ) (2.5 MG/3ML) 0.083% nebulizer solution, Take 3 mLs (2.5 mg total) by nebulization every 6 (six) hours as needed for wheezing or shortness of breath., Disp: 150 mL, Rfl: 1   cholecalciferol (VITAMIN D3) 25 MCG (1000 UNIT) tablet, Take 1,000 Units by mouth daily., Disp: , Rfl:    Dulaglutide  (TRULICITY ) 4.5 MG/0.5ML SOAJ, Inject 4.5 mg as directed once a week., Disp: 8 mL, Rfl: 3   letrozole  (FEMARA ) 2.5 MG tablet, Take 2.5 mg by mouth daily., Disp: , Rfl:    nystatin  (MYCOSTATIN /NYSTOP ) powder, Apply 1 Application topically 3 (three) times daily as needed., Disp: 60 g, Rfl: 5  Review of Symptoms: Complete 10-system review is positive for: Urinary frequency, headache, wheezing, numbness, abdominal pain, cough, hot flashes, itching  Physical Exam: BP (!) 144/76 Comment: Manual recheck, no S&S, will f/u with PCP  Pulse 67   Temp 97.9 F (36.6 C) (Oral)   Resp 20   Wt (!) 389 lb 3.2 oz (176.5 kg)   SpO2 97%   BMI 66.81 kg/m  General:  Alert, oriented, no acute distress. HEENT: Normocephalic, atraumatic. Sclera anicteric.  Chest: Normal work of breathing. CTAB Cardiovascular: Regular rate and rhythm, no murmurs. Abdomen: Soft, nontender.  Normoactive bowel sounds.  No masses appreciated.   Extremities: Grossly normal range of motion.  Warm, well perfused.  No edema bilaterally. Skin: No rashes or lesions noted. Lymphatics: No cervical, supraclavicular, or  inguinal adenopathy. GU: Normal appearing external genitalia without erythema, excoriation, or lesions.  Speculum exam reveals normal cervix with IUD strings visualized. Bimanual exam reveals normal cervix. Uterus and adnexa unable to be palpated due to body habitus. Exam chaperoned by Kimberly [place], CMA   EMB Procedure: After appropriate verbal informed consent was obtained, a timeout was performed. A sterile speculum was placed in the vagina, and the area was cleaned with betadine x3. A single-tooth tenaculum was placed on the anterior lip of the cervix.  A cervical block was placed with 10 mL of 2% lidocaine  at 4 and 8:00.  An endometrial biopsy pipelle was advanced carefully to the uterine fundus which sounded to 9cm. An adequate sample was obtained over 1 pass. The tenaculum was removed, and tenaculum sites were noted to be hemostatic with silver nitrate. The speculum was removed from the vagina. The patient tolerated the procedure well.   UPT: not indicated   Laboratory & Radiologic Studies: Surgical pathology (07/21/23): A. ENDOMETRIAL BIOPSY:       Benign inactive endometrium and pseudodecidualized stroma, chronic  inflammatory cells and hemosiderin-laden macrophages.       Negative for hyperplasia, atypia or malignancy.

## 2024-02-02 ENCOUNTER — Encounter: Payer: Self-pay | Admitting: Psychiatry

## 2024-02-02 ENCOUNTER — Inpatient Hospital Stay: Attending: Psychiatry | Admitting: Psychiatry

## 2024-02-02 ENCOUNTER — Telehealth: Payer: Self-pay | Admitting: Family Medicine

## 2024-02-02 ENCOUNTER — Telehealth: Payer: Self-pay | Admitting: Pharmacist

## 2024-02-02 VITALS — BP 144/76 | HR 67 | Temp 97.9°F | Resp 20 | Wt 389.2 lb

## 2024-02-02 DIAGNOSIS — J45909 Unspecified asthma, uncomplicated: Secondary | ICD-10-CM | POA: Insufficient documentation

## 2024-02-02 DIAGNOSIS — E042 Nontoxic multinodular goiter: Secondary | ICD-10-CM | POA: Diagnosis not present

## 2024-02-02 DIAGNOSIS — C541 Malignant neoplasm of endometrium: Secondary | ICD-10-CM | POA: Diagnosis present

## 2024-02-02 DIAGNOSIS — F17210 Nicotine dependence, cigarettes, uncomplicated: Secondary | ICD-10-CM | POA: Insufficient documentation

## 2024-02-02 DIAGNOSIS — J4541 Moderate persistent asthma with (acute) exacerbation: Secondary | ICD-10-CM

## 2024-02-02 DIAGNOSIS — Z6841 Body Mass Index (BMI) 40.0 and over, adult: Secondary | ICD-10-CM

## 2024-02-02 DIAGNOSIS — Z79811 Long term (current) use of aromatase inhibitors: Secondary | ICD-10-CM | POA: Insufficient documentation

## 2024-02-02 DIAGNOSIS — Z975 Presence of (intrauterine) contraceptive device: Secondary | ICD-10-CM | POA: Insufficient documentation

## 2024-02-02 DIAGNOSIS — E041 Nontoxic single thyroid nodule: Secondary | ICD-10-CM

## 2024-02-02 NOTE — Telephone Encounter (Signed)
 Patient contacted for follow-up of tobacco cessation.   Since last contact patient reports in May she was smoking 7-8 cigarettes and successfully quit cold [place] for 9 days. Since then reports smoking 5 cigarettes/day.   Previously tried Chantix  (varenicline ) with little success. Patient uninterested in taking medication.  Reports smoking with morning coffee, phone calls, car rides, last 2 are puff and put out but has been trying to leave cigarettes at home so she doesn't smoke while out. Reports last quit attempt in May with fiance who has been successful, endorses feeling guilty and disappointed in her current smoking. High motivation to quit smoking.   Reports she will be going cold [place] on 8/30 or 8/31. Plans to visit fiance who has a smoke-free household.  Goal quit date: 02/11/24   Total time with patient call and documentation of interaction: 11 minutes.  F/U Phone call planned: 03/09/24 (30 days smoke-free)

## 2024-02-02 NOTE — Patient Instructions (Addendum)
 It was a pleasure to see you in clinic today. - Biopsy today. We will follow-up the results. - Please call pulmonology for follow-up [phone] - New referral placed for endocrinology. - Return visit planned for 3 months   Thank you very much for allowing me to provide care for you today.  I appreciate your confidence in choosing our Gynecologic Oncology team at [facility].  If you have any questions about your visit today please call our office or send us  a MyChart message and we will get back to you as soon as possible.

## 2024-02-02 NOTE — Telephone Encounter (Signed)
 Hi Risk manager,  Please schedule this patient for follow up with me for BP check in the next 1-2 months.  Thanks, Suzann Daring, MD  [facility] Medicine Teaching Service

## 2024-02-03 NOTE — Telephone Encounter (Signed)
 Reviewed and agree with Dr Rennis plan.

## 2024-02-05 LAB — SURGICAL PATHOLOGY

## 2024-02-10 ENCOUNTER — Ambulatory Visit: Payer: Self-pay | Admitting: Psychiatry

## 2024-03-09 ENCOUNTER — Telehealth: Payer: Self-pay | Admitting: Pharmacist

## 2024-03-09 NOTE — Telephone Encounter (Signed)
 Reviewed and agree with Dr Rennis plan.

## 2024-03-09 NOTE — Telephone Encounter (Signed)
 Patient contacted for follow/up of tobacco intake cessation attempt. Reports that she is smoking 3-4 cigarettes/day. Goal of <2 cigarettes/day by the end of October.   Total time with patient call and documentation of interaction: 3 minutes. Follow-up phone call planned: End of October

## 2024-04-08 ENCOUNTER — Telehealth: Payer: Self-pay | Admitting: Pharmacist

## 2024-04-08 NOTE — Telephone Encounter (Signed)
 Patient contacted for follow-up of tobacco cessation.   Since last contact patient reports SHE HAS QUIT for 8 DAYS!  Current Medications include: None for tobacco cessation. We discussed the trigger of early AM coffee as a trigger.  She reports AM craving are very challenging for her.   Medication QUESTION - could her Trulicity  (dulaglutide ) dose be increased.  She is currently on 4.5mg  weekly (top dose).  She agreed to schedule PCP follow-up 11/21 - scheduled.  At time, alternate GLP agents may be discussed.  She has not used an insulin  pen needle and Mounjaro (tirzepatide) is non-preferred status on Medicaid PDL.  She was OK with staying on Trulicity  (dulaglutide ) until her visit 11/21  Total time with patient call and documentation of interaction: 12 minutes.

## 2024-04-27 NOTE — Assessment & Plan Note (Signed)
 Discussed cessation

## 2024-04-27 NOTE — Progress Notes (Addendum)
 SUBJECTIVE:   CHIEF COMPLAINT: A1C HPI:   Deborah Benson is a 63 y.o.  with history notable for endometrial cancer, type 2 DM, and HTN  presenting for follow up .   Discussed the use of AI scribe software for clinical note transcription with the patient, who gave verbal consent to proceed.  History of Present Illness  Abdominal pain and gastrointestinal symptoms - Abdominal pain present for 1.5 weeks, described as intermittent muscle spasm-like sensation, this has now resolved, last 4 days, last vomiting >10 days ago  - Currently able to tolerate Jell-O and most foods - Normal bowel movements with some diarrhea initially - Improvement in symptoms over the past three to five days - Associated symptoms include gas and burping since discontinuing Trulicity  three weeks ago - No melena, hematochezia, hematemesis   Hypertension - Blood pressure currently elevated - History of hypertension - Previously discontinued antihypertensive medication due to hypotension - No HA, chest pain or dyspnea   Knee Pain  - The patient has right greater than left knee pain.  This has been ongoing for many years.  - No falls no locking popping or catching.  No redness or fevers.  Ear fullness Feels her ears are 'swimming'  No headaches, fevers, or pain      PERTINENT  PMH / PSH/Family/Social History : Thyroid  nodules, referred to Endocrinology for this, had normal TSH in spring, last imaging March 2024   OBJECTIVE:   BP (!) 128/103 (BP Location: Right Wrist, Patient Position: Standing, Cuff Size: Normal)   Pulse 72   Ht 5' 3 (1.6 m)   Wt (!) 388 lb 6.4 oz (176.2 kg)   SpO2 99%   BMI 68.80 kg/m   Today's weight:  Last Weight  Most recent update: 04/30/2024 10:44 AM    Weight  176.2 kg (388 lb 6.4 oz)              Review of prior weights: Filed Weights   04/30/24 1043  Weight: (!) 388 lb 6.4 oz (176.2 kg)    HEENT Mild cerumen bilaterally TM slightly retracted on L   Cardiac: Regular rate and rhythm. Normal S1/S2. No murmurs, rubs, or gallops appreciated. Lungs: Clear bilaterally to ascultation.  Abdomen: Normoactive bowel sounds. No tenderness to deep or light palpation. No rebound or guarding. Non tender throughout  Psych: Pleasant and appropriate    ASSESSMENT/PLAN:   Assessment & Plan Primary hypertension Diet controlled--had dizziness with olmesartan  Discussed goal BP <130/80    Recommend 24 hour monitor if elevated at follow up in 1 month  Left thyroid  nodule Discussed she will consider ultrasound in spring  Type 2 diabetes mellitus without complication, without long-term current use of insulin  (HCC) A1C and lipids today   A1C above goal Discussed dietary changes Given intolerance with Trulicity , will switch to Mounjaro  2.5 mg weekly  TOBACCO USE Discussed cessation  Encounter for screening mammogram for malignant neoplasm of breast Ordered mammogram  Screening for colon cancer Referred for colonoscopy  Candidiasis Refilled nystatin   Chronic pain of right knee Suspect OA Will obtain Knee xrays Referred to [facility]  Abdominal pain, unspecified abdominal location ? If due to acute viral illness, food related, or pancreatitis CMP, CBC, and lipase today Low threshold for imaging given underlying cancer but symptoms have improved If recur, will obtain CT  Dysfunction of both eustachian tubes Discussed No AOM on exam Suspect above eustachian tube issue Rx flonase    At next visit--discuss smoking and statin again  Suzann Daring, MD  Family Medicine Teaching Service  [facility]

## 2024-04-27 NOTE — Assessment & Plan Note (Addendum)
 A1C and lipids today   A1C above goal Discussed dietary changes Given intolerance with Trulicity , will switch to Mounjaro  2.5 mg weekly

## 2024-04-27 NOTE — Assessment & Plan Note (Addendum)
 Diet controlled--had dizziness with olmesartan  Discussed goal BP <130/80    Recommend 24 hour monitor if elevated at follow up in 1 month

## 2024-04-27 NOTE — Patient Instructions (Addendum)
 It was wonderful to see you today.  Please bring ALL of your medications with you to every visit.   Today we talked about:  Your diabetes number is up a bit to 8.2--likely due to being off of Trulicity     I sent in Mounjaro --this is a weekly injection   You will be called about [employer]   Knee pain- I ordered an xray--information below You can use Voltaren gel over the counter  Stomach issues   I am sorry your stomach was upset  We will check some blood work today  An x-ray was ordered for you---you do not need an appointment to have this completed.  I recommend going to [facility] [address] [place]  If the results are normal,I will send you a letter  I will call you with results if anything is abnormal    I recommend you call Gastroenterology for a colonoscopy--you can do this in the new year  I recommend you undergo a mammogram.   You can call to schedule an appointment by calling [phone].   Directions [address] [place], [place] [address]  Please let me know if you have questions. I will send you a letter or call you with results.     Please follow up in 2-3 weeks to recheck blood pressure---I recommend a 24 hour monitor with Dr. Koval if it remains persistently elevated   Thank you for choosing [facility].   Please call [phone] with any questions about today's appointment.  Please be sure to schedule follow up at the front  desk before you leave today.   Suzann Daring, MD  Family Medicine

## 2024-04-30 ENCOUNTER — Ambulatory Visit (INDEPENDENT_AMBULATORY_CARE_PROVIDER_SITE_OTHER): Admitting: Family Medicine

## 2024-04-30 ENCOUNTER — Encounter: Payer: Self-pay | Admitting: Family Medicine

## 2024-04-30 VITALS — BP 128/103 | HR 72 | Ht 63.0 in | Wt 388.4 lb

## 2024-04-30 DIAGNOSIS — I10 Essential (primary) hypertension: Secondary | ICD-10-CM

## 2024-04-30 DIAGNOSIS — F17200 Nicotine dependence, unspecified, uncomplicated: Secondary | ICD-10-CM | POA: Diagnosis not present

## 2024-04-30 DIAGNOSIS — R109 Unspecified abdominal pain: Secondary | ICD-10-CM

## 2024-04-30 DIAGNOSIS — G8929 Other chronic pain: Secondary | ICD-10-CM | POA: Diagnosis not present

## 2024-04-30 DIAGNOSIS — E119 Type 2 diabetes mellitus without complications: Secondary | ICD-10-CM

## 2024-04-30 DIAGNOSIS — Z1211 Encounter for screening for malignant neoplasm of colon: Secondary | ICD-10-CM | POA: Diagnosis not present

## 2024-04-30 DIAGNOSIS — B379 Candidiasis, unspecified: Secondary | ICD-10-CM | POA: Diagnosis not present

## 2024-04-30 DIAGNOSIS — M25561 Pain in right knee: Secondary | ICD-10-CM

## 2024-04-30 DIAGNOSIS — H6993 Unspecified Eustachian tube disorder, bilateral: Secondary | ICD-10-CM

## 2024-04-30 DIAGNOSIS — Z1231 Encounter for screening mammogram for malignant neoplasm of breast: Secondary | ICD-10-CM

## 2024-04-30 DIAGNOSIS — E041 Nontoxic single thyroid nodule: Secondary | ICD-10-CM

## 2024-04-30 LAB — POCT GLYCOSYLATED HEMOGLOBIN (HGB A1C): HbA1c, POC (controlled diabetic range): 8.2 % — AB (ref 0.0–7.0)

## 2024-04-30 MED ORDER — FLUTICASONE PROPIONATE 50 MCG/ACT NA SUSP: 2.0000 | Freq: Every day | NASAL | 6 refills | Status: DC

## 2024-04-30 MED ORDER — TIRZEPATIDE 2.5 MG/0.5ML ~~LOC~~ SOAJ: 2.5000 mg | SUBCUTANEOUS | 1 refills | Status: DC

## 2024-04-30 MED ORDER — NYSTATIN 100000 UNIT/GM EX POWD: 1.0000 | Freq: Three times a day (TID) | CUTANEOUS | 5 refills | Status: DC | PRN

## 2024-04-30 MED ORDER — ONDANSETRON 4 MG PO TBDP: 4.0000 mg | ORAL_TABLET | Freq: Three times a day (TID) | ORAL | 0 refills | Status: DC | PRN

## 2024-04-30 NOTE — Assessment & Plan Note (Addendum)
 Discussed she will consider ultrasound in spring

## 2024-05-01 LAB — LIPID PANEL
Chol/HDL Ratio: 4 ratio (ref 0.0–4.4)
Cholesterol, Total: 161 mg/dL (ref 100–199)
HDL: 40 mg/dL (ref >39)
LDL Chol Calc (NIH): 103 mg/dL — ABNORMAL HIGH (ref 0–99)
Triglycerides: 99 mg/dL (ref 0–149)
VLDL Cholesterol Cal: 18 mg/dL (ref 5–40)

## 2024-05-01 LAB — COMPREHENSIVE METABOLIC PANEL WITH GFR
ALT: 9 IU/L (ref 0–32)
AST: 12 IU/L (ref 0–40)
Albumin: 3.6 g/dL — ABNORMAL LOW (ref 3.9–4.9)
Alkaline Phosphatase: 118 IU/L (ref 49–135)
BUN/Creatinine Ratio: 12 (ref 12–28)
BUN: 11 mg/dL (ref 8–27)
Bilirubin Total: 0.2 mg/dL (ref 0.0–1.2)
CO2: 25 mmol/L (ref 20–29)
Calcium: 9.4 mg/dL (ref 8.7–10.3)
Chloride: 99 mmol/L (ref 96–106)
Creatinine, Ser: 0.91 mg/dL (ref 0.57–1.00)
Globulin, Total: 4.1 g/dL (ref 1.5–4.5)
Glucose: 190 mg/dL — ABNORMAL HIGH (ref 70–99)
Potassium: 4.1 mmol/L (ref 3.5–5.2)
Sodium: 137 mmol/L (ref 134–144)
Total Protein: 7.7 g/dL (ref 6.0–8.5)
eGFR: 71 mL/min/1.73 (ref >59)

## 2024-05-01 LAB — CBC
Hematocrit: 39.4 % (ref 34.0–46.6)
Hemoglobin: 12.5 g/dL (ref 11.1–15.9)
MCH: 28.2 pg (ref 26.6–33.0)
MCHC: 31.7 g/dL (ref 31.5–35.7)
MCV: 89 fL (ref 79–97)
Platelets: 265 x10E3/uL (ref 150–450)
RBC: 4.43 x10E6/uL (ref 3.77–5.28)
RDW: 15.9 % — ABNORMAL HIGH (ref 11.7–15.4)
WBC: 5.5 x10E3/uL (ref 3.4–10.8)

## 2024-05-01 LAB — LIPASE: Lipase: 188 U/L — ABNORMAL HIGH (ref 14–72)

## 2024-05-03 ENCOUNTER — Encounter: Payer: Self-pay | Admitting: Psychiatry

## 2024-05-03 ENCOUNTER — Inpatient Hospital Stay: Attending: Psychiatry | Admitting: Psychiatry

## 2024-05-03 VITALS — BP 150/88 | HR 73 | Temp 99.1°F | Resp 20 | Wt 388.2 lb

## 2024-05-03 DIAGNOSIS — C541 Malignant neoplasm of endometrium: Secondary | ICD-10-CM

## 2024-05-03 DIAGNOSIS — Z6841 Body Mass Index (BMI) 40.0 and over, adult: Secondary | ICD-10-CM

## 2024-05-03 DIAGNOSIS — J441 Chronic obstructive pulmonary disease with (acute) exacerbation: Secondary | ICD-10-CM

## 2024-05-03 DIAGNOSIS — E041 Nontoxic single thyroid nodule: Secondary | ICD-10-CM

## 2024-05-03 NOTE — Patient Instructions (Addendum)
 It was a pleasure to see you in clinic today. - Normal exam today - CT scan in January - New referral placed to endocrine. If they call, please schedule appt. That is for the thyroid . - Return visit planned for 3 months with biopsy at that visit. Pulmonology phone number [phone]   Thank you very much for allowing me to provide care for you today.  I appreciate your confidence in choosing our Gynecologic Oncology team at [facility].  If you have any questions about your visit today please call our office or send us  a MyChart message and we will get back to you as soon as possible.

## 2024-05-03 NOTE — Progress Notes (Signed)
 Gynecologic Oncology Return Clinic Visit  Date of Service: 05/03/2024 Referring Provider: Ronal Pinal, MD   Assessment & Plan: Deborah Benson is a 63 y.o. 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  Endometrial cancer: - Pt not previously a surgical candidate due to obesity and pulmonary status. Pt was admitted to the hospital for 5 days after her D&C due to hypoxia. Was previously unable to lay flat which limited her option for radiation treatment, however, now able to lay flat and overall improving respiratory status. - Continue at this time with IUD and letrozole . - Last CT 12/16/23, stable overall NED. Results reviewed. - Repeat one more CT at 22mo then space out to yearly. Ordered today (for ~06/2024) - If inadequate treatment with current regimen, given improvement in respiratory status and weight loss, could reconsider definitive radiation treatment versus attempt at robotic hysterectomy. - Germline genetic testing negative. - Last EMB on 02/02/24 benign. 3 negative biopsies in a row.  Will now space out EMBx to q33mo. Next biopsy as next visit. - Plan return visit in 3 months.   Morbid obesity: - Patient has been working on lifestyle changes to help with weight loss and has been on Trulicity .  lost over 80 pounds initially, weight now stable.  -Plan was to transition to Mounjaro  but currently is being worked up for upper abdominal pain following coming off of Trulicity . -Continue to monitor.  Reviewed goal with patient of getting closer to 300 pounds prior to reconsidering hysterectomy as a definitive intervention for her endometrial cancer. Wt Readings from Last 5 Encounters:  05/03/24 (!) 388 lb 3.2 oz (176.1 kg)  04/30/24 (!) 388 lb 6.4 oz (176.2 kg)  02/02/24 (!) 389 lb 3.2 oz (176.5 kg)  10/20/23 (!) 383 lb 9.6 oz  (174 kg)  09/01/23 (!) 379 lb 12.8 oz (172.3 kg)    Asthma, improving: - Last seen by pulmonology on 04/02/2023. - Planned follow-up in 6 months, due. Pt encouraged to call and schedule follow-up.  Patient provided phone number to call and schedule follow-up. - Reviewed that asthma control will also impact safe ability to consider hysterectomy in the future. - Cigarette cessation encouraged.  Thyroid  nodules, mediastinal nodule: - Unchanged mediastinal nodule at thoracic inlet on CT scan. - Per CT report, differential includes thyroid  nodule, ectopic thyroid  tissue, or thymoma. - Previously referred to endocrinology. Did not present.  Has appointment scheduled for 05/20/2024.  Patient encouraged to keep this appointment.   RTC 3 months.  Hoy Masters, MD Gynecologic Oncology   Medical Decision Making I personally spent  TOTAL 20 minutes face-to-face and non-face-to-face in the care of this patient, which includes all pre, intra, and post visit time on the date of service.  ----------------------- Reason for Visit: Surveillance  Treatment History: Oncology History Overview Note  MSI stable, ER 95%, PR 100% Genetics neg   Uterine cancer (HCC)  04/17/2022 Imaging   US  pelvis 1. Prominent abnormal endometrial thickening (41 mm). In the setting of post-menopausal bleeding, endometrial sampling is indicated to exclude carcinoma. If results are benign, sonohysterogram should be considered for focal lesion work-up.  2. Nonvisualization of the ovaries. No adnexal masses.   06/04/2022 Pathology Results   A. 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  See comment.    07/17/2022 Imaging  MRI pelvis 1. Maximal thickness of the endometrial canal approaches 2.5 cm, compared to gross thickening up to 4 cm on the previous exam. Presence is  suspicious for residual tumor in the endometrium but shows improvement since previous imaging. Exam quality is compromised by patient's body habitus. Depth of myometrial invasion is difficult to assess on the current study but there is certainly no gross invasion beyond 50%. If additional MRI follow-up is desired direct involvement from a radiologist for protocol prescription may be helpful to mitigate artifacts and maximize image quality. 2. IUD in place 3. 11 mm RIGHT pelvic sidewall lymph node other smaller lymph nodes in the pelvis. This finding is nonspecific but suspicious in light of gynecologic neoplasm. PET imaging could be considered for further evaluation.   07/22/2022 Initial Diagnosis   Uterine cancer (HCC)   08/21/2022 Imaging   Lack of intravenous contrast administration and photon attenuation related to patient habitus limit evaluation of the thoracic inlet and pelvis. Within this context:   1. IUD in place in the uterus. Pelvic structures not well evaluated on this examination. 2. Pelvic sidewall lymph node seen on prior MRI is not identified on this examination, likely secondary to artifact from photon attenuation. 3. Enlarged periportal lymph nodes measure up to 16 mm in short axis, nonspecific and commonly reactive. However metastatic nodal disease while not favored is not excluded. Consider attention on follow-up imaging. 4. Lobular enlargement of the thyroid  gland measuring up to 2.7 cm, suggest further evaluation with dedicated thyroid  ultrasound. 5. Soft tissue nodule anterior to the great vessels and subjacent to the thyroid  gland measuring 2.7 x 1.8 cm, is nonspecific possibly reflecting an enlarged lymph node or parathyroid adenoma. Consider further evaluation with contrast enhanced CT of the neck. 6. Stranding in the anterior abdominal wall, nonspecific but possibly reflecting cellulitis. 7.  Aortic Atherosclerosis (ICD10-I70.0).   08/21/2022 Cancer Staging   Staging form:  Corpus Uteri - Carcinoma and Carcinosarcoma, AJCC 8th Edition - Clinical stage from 08/21/2022: Stage IIIC2 (cT1, cN2a, cM0) - Signed by Lonn Hicks, MD on 08/21/2022 Stage prefix: Initial diagnosis    Genetic Testing   [facility] CancerNext-Expanded Panel+RNA was Negative. Report date is 08/23/2022.  The CancerNext-Expanded gene panel offered by [facility] and includes sequencing, rearrangement, and RNA analysis for the following 71 genes: AIP, ALK, APC, ATM, AXIN2, BAP1, BARD1, BMPR1A, BRCA1, BRCA2, BRIP1, CDC73, CDH1, CDK4, CDKN1B, CDKN2A, CHEK2, CTNNA1, DICER1, FH, FLCN, KIF1B, LZTR1, MAX, MEN1, MET, MLH1, MSH2, MSH3, MSH6, MUTYH, NF1, NF2, NTHL1, PALB2, PHOX2B, PMS2, POT1, PRKAR1A, PTCH1, PTEN, RAD51C, RAD51D, RB1, RET, SDHA, SDHAF2, SDHB, SDHC, SDHD, SMAD4, SMARCA4, SMARCB1, SMARCE1, STK11, SUFU, TMEM127, TP53, TSC1, TSC2, and VHL (sequencing and deletion/duplication); EGFR, EGLN1, HOXB13, KIT, MITF, PDGFRA, POLD1, and POLE (sequencing only); EPCAM and GREM1 (deletion/duplication only).    08/23/2022 -  Anti-estrogen oral therapy   She is started on Femara    08/29/2022 Imaging   1. Thyromegaly with findings suggestive of multinodular goiter. 2. Nodules labeled #1 and #4 both meet imaging criteria to recommend percutaneous sampling as indicated. 3. Nodule #3 meets imaging criteria to recommend a 1 year follow-up.  4. Note, the substernal nodule demonstrated on recent chest CT was not visualized sonographically due to substernal location. Further evaluation could be performed with nuclear medicine thyroid  uptake and scan (to evaluate if this substernal tissue represents exophytic or ectopic thyroid  parenchyma) versus contrast-enhanced neck CT as indicated.   11/25/2022 Tumor Marker   Patient's tumor was tested for the following markers: CA-125.  Results of the tumor marker test revealed 9.2.   11/25/2022 Imaging   CT CHEST ABDOMEN PELVIS W CONTRAST  Result Date: 11/22/2022 CLINICAL DATA:   Cervical cancer, assess treatment response, ongoing oral chemotherapy * Tracking Code: BO * EXAM: CT CHEST, ABDOMEN, AND PELVIS WITH CONTRAST TECHNIQUE: Multidetector CT imaging of the chest, abdomen and pelvis was performed following the standard protocol during bolus administration of intravenous contrast. RADIATION DOSE REDUCTION: This exam was performed according to the departmental dose-optimization program which includes automated exposure control, adjustment of the mA and/or kV according to patient size and/or use of iterative reconstruction technique. CONTRAST:  OMNIPAQUE  IOHEXOL  300 MG/ML SOLN additional oral enteric contrast COMPARISON:  CT chest abdomen pelvis, 08/19/2022, thyroid  ultrasound, 08/29/2022 FINDINGS: Examination is significantly limited by patient body habitus and related photopenia. CT CHEST FINDINGS Cardiovascular: No significant vascular findings. Mild cardiomegaly. No pericardial effusion. Mediastinum/Nodes: Unchanged anterior mediastinal nodule just below the thoracic inlet measuring 2.9 x 1.5 cm (series 2, image 16). No enlarged mediastinal, hilar, or axillary lymph nodes. Heterogeneously enlarged thyroid  gland, previously assessed by dedicated thyroid  ultrasound. This has been evaluated on previous imaging. (ref: J Am Coll Radiol. 2015 Feb;12(2): 143-50). Trachea, and esophagus demonstrate no significant findings. Lungs/Pleura: Unchanged bandlike scarring or atelectasis throughout the lungs. No pleural effusion or pneumothorax. Musculoskeletal: No chest wall abnormality. No acute osseous findings. CT ABDOMEN PELVIS FINDINGS Hepatobiliary: Hepatomegaly, maximum coronal span 20.3 cm. No solid liver abnormality is seen. No gallstones, gallbladder wall thickening, or biliary dilatation. Pancreas: Unremarkable. No pancreatic ductal dilatation or surrounding inflammatory changes. Spleen: Normal in size without significant abnormality. Adrenals/Urinary Tract: Adrenal glands are  unremarkable. Kidneys are normal, without renal calculi, solid lesion, or hydronephrosis. Bladder is unremarkable. Stomach/Bowel: Stomach is within normal limits. Appendix appears normal. No evidence of bowel wall thickening, distention, or inflammatory changes. Vascular/Lymphatic: Aortic atherosclerosis. Unchanged enlarged portacaval lymph node measuring up to 2.8 x 1.6 cm (series 2, image 67). Unchanged prominent inguinal lymph nodes (series 2, image 115). Reproductive: IUD present in the fundal endometrial cavity (series 2, image 106). Evaluation of the pelvic structures very limited by body habitus and photopenia; no obvious cervical mass appreciated. Other: No abdominal wall hernia or abnormality. No ascites. Musculoskeletal: No acute osseous findings. IMPRESSION: 1. Examination is significantly limited by patient body habitus and related photopenia. 2. Evaluation of the pelvic structures particular is very limited; no obvious cervical mass appreciated. 3. Unchanged enlarged portacaval lymph node measuring up to 2.8 x 1.6 cm, as previously reported, nonspecific and most likely reactive. Unchanged prominent inguinal lymph nodes. No specific evidence of nodal metastatic disease. Continued attention on follow-up. 4. Unchanged anterior mediastinal nodule just below the thoracic inlet measuring 2.9 x 1.5 cm. This was not visualized on recent prior thyroid  ultrasound, but nonetheless may reflect a substernal thyroid  nodule or ectopic thyroid  tissue. Alternately this could reflect an anterior mediastinal mass such as a thymoma. At either rate, this does not likely reflect metastatic disease cervical cancer. Contrast enhanced MR of the chest or nuclear thyroid  uptake scan may be helpful to further assess. 5. Hepatomegaly. Aortic Atherosclerosis (ICD10-I70.0). Electronically Signed   By: Marolyn JONETTA Jaksch M.D.   On: 11/22/2022 22:31      12/30/2022 Pathology Results   A. ENDOMETRIAL BIOPSY:  Focal complex atypical  hyperplasia (EIN)  Background marked progestational effect    04/14/2023 Pathology Results   A. ENDOMETRIAL BIOPSY:  -  Disordered proliferative endometrium and pseudodecidualized stroma  with chronic inflammatory cells and hemosiderin-laden macrophages.  07/21/2023 Pathology Results   A. ENDOMETRIAL BIOPSY:       Benign inactive endometrium and pseudodecidualized stroma, chronic  inflammatory cells and hemosiderin-laden macrophages.       Negative for hyperplasia, atypia or malignancy.      Interval History: Patient reports overall doing okay.  Still taking the letrozole  without issue.  No vaginal bleeding.  Notes occasional cramping.  Takes Aleve for this as needed with good effect.  Does report a couple of days of constipation in the last few days but otherwise no issues with her bowels or bladder.  Was able to quit smoking cigarettes for 1 whole week but is now back to about 4 cigarettes a day.  Has used her albuterol  inhaler a little bit more frequently last few days, 2 times a day for wheezing.  Prior to that she was using still about 1 time a day.  She reports that she was taken off Trulicity  because her weight plateaued.  Had noted some stomach pain since coming off the Trulicity  so her doctors are working this up.  Was going to transition to Mounjaro  but has not done that yet.  Reports that her car is down and her mom is in the hospital with a stroke so she is under a little bit of extra stress at the moment.  She did not see pulmonology or endocrinology.    Past Medical/Surgical History: Past Medical History:  Diagnosis Date   Arthritis    Asthma    Cancer (HCC)    ovarian cancer   Depression    Diabetes mellitus without complication (HCC)    oral meds only   Gout    HTN (hypertension)    borderline med was stopped by pt- MD aware.   Neuromuscular disorder (HCC)    neuropathy -hands/ feet   Neuropathy    Obesity    BMI >76.6   Pneumonia     Past Surgical  History:  Procedure Laterality Date   FOOT SURGERY     due to needle break in foot   HYSTEROSCOPY WITH D & C N/A 06/04/2022   Procedure: DILATATION AND CURETTAGE;  Surgeon: Eldonna Mays, MD;  Location: [facility];  Service: Gynecology;  Laterality: N/A;   INTRAUTERINE DEVICE (IUD) INSERTION N/A 06/04/2022   Procedure: MIRENA  INTRAUTERINE DEVICE (IUD) INSERTION;  Surgeon: Eldonna Mays, MD;  Location: [facility];  Service: Gynecology;  Laterality: N/A;   MULTIPLE TOOTH EXTRACTIONS     TONSILLECTOMY      Family History  Problem Relation Age of Onset   Heart attack Mother    Stroke Mother    Gout Mother    Hypertension Mother    Lung cancer Father 28 - 57       smoked   Glaucoma Father    Heart disease Sister        fluid    Other Sister        spinal disease   Throat cancer Sister    Diabetes Brother    Stroke Brother    Ovarian cancer Maternal Grandmother    Dementia Maternal Grandfather    Prostate cancer Maternal Uncle    Glaucoma Other        nephew   Seizures Other        niece   Colon cancer Neg Hx    Breast cancer Neg Hx    Endometrial cancer Neg Hx    Pancreatic cancer Neg Hx     Social History   Socioeconomic  History   Marital status: Single    Spouse name: Not on file   Number of children: 0   Years of education: Not on file   Highest education level: GED or equivalent  Occupational History   Occupation: disabled  Tobacco Use   Smoking status: Every Day    Current packs/day: 0.40    Average packs/day: 0.4 packs/day for 45.9 years (18.4 ttl pk-yrs)    Types: Cigarettes    Start date: 06/10/1978   Smokeless tobacco: Never   Tobacco comments:    down to 5 per day - Previous 7-10  Vaping Use   Vaping status: Never Used  Substance and Sexual Activity   Alcohol use: No    Alcohol/week: 0.0 standard drinks of alcohol   Drug use: No   Sexual activity: Not Currently  Other Topics Concern   Not on file  Social History Narrative         Past Medical  History:      Irregular menstrual periods      Asthma         Social History:      Disabled?,  Weight and OA; Moved to [place] 6 yrs ago.  Single         Social Drivers of Health   Financial Resource Strain: Medium Risk (04/23/2024)   Overall Financial Resource Strain (CARDIA)    Difficulty of Paying Living Expenses: Somewhat hard  Food Insecurity: Food Insecurity Present (04/23/2024)   Hunger Vital Sign    Worried About Running Out of Food in the Last Year: Sometimes true    Ran Out of Food in the Last Year: Often true  Transportation Needs: Unmet Transportation Needs (04/23/2024)   PRAPARE - Administrator, Civil Service (Medical): Yes    Lack of Transportation (Non-Medical): Yes  Physical Activity: Insufficiently Active (04/23/2024)   Exercise Vital Sign    Days of Exercise per Week: 2 days    Minutes of Exercise per Session: 20 min  Stress: Stress Concern Present (04/23/2024)   [employer] of Occupational Health - Occupational Stress Questionnaire    Feeling of Stress: Very much  Social Connections: Unknown (04/23/2024)   Social Connection and Isolation Panel    Frequency of Communication with Friends and Family: More than three times a week    Frequency of Social Gatherings with Friends and Family: More than three times a week    Attends Religious Services: More than 4 times per year    Active Member of [employer] or Organizations: Yes    Attends Engineer, Structural: More than 4 times per year    Marital Status: Patient declined    Current Medications:  Current Outpatient Medications:    albuterol  (PROVENTIL ) (2.5 MG/3ML) 0.083% nebulizer solution, Take 3 mLs (2.5 mg total) by nebulization every 6 (six) hours as needed for wheezing or shortness of breath., Disp: 150 mL, Rfl: 1   cholecalciferol (VITAMIN D3) 25 MCG (1000 UNIT) tablet, Take 1,000 Units by mouth daily., Disp: , Rfl:    letrozole  (FEMARA ) 2.5 MG tablet, Take 2.5 mg by mouth daily.,  Disp: , Rfl:    fluticasone  (FLONASE ) 50 MCG/ACT nasal spray, Place 2 sprays into both nostrils daily., Disp: 16 g, Rfl: 6   nystatin  (MYCOSTATIN /NYSTOP ) powder, Apply 1 Application topically 3 (three) times daily as needed., Disp: 60 g, Rfl: 5   ondansetron  (ZOFRAN -ODT) 4 MG disintegrating tablet, Take 1 tablet (4 mg total) by mouth every 8 (eight) hours as  needed for nausea., Disp: 10 tablet, Rfl: 0   tirzepatide  (MOUNJARO ) 2.5 MG/0.5ML Pen, Inject 2.5 mg into the skin once a week., Disp: 2 mL, Rfl: 1  Review of Symptoms: Complete 10-system review is positive for: Urinary frequency, joint pain, wheezing, chest pain, abdominal pain, depression, cough, hot flashes  Physical Exam: BP (!) 167/74 (BP Location: Right Arm, Patient Position: Sitting)   Pulse 73   Temp 99.1 F (37.3 C) (Oral)   Resp 20   Wt (!) 388 lb 3.2 oz (176.1 kg)   SpO2 100%   BMI 68.77 kg/m  General: Alert, oriented, no acute distress. HEENT: Normocephalic, atraumatic. Sclera anicteric.  Chest: Normal work of breathing.  Occasional wheeze and rhonchi in bilateral lung bases. Cardiovascular: Regular rate and rhythm, no murmurs. Abdomen: Soft, nontender.  Normoactive bowel sounds.  No masses appreciated.   Extremities: Grossly normal range of motion.  Warm, well perfused.  No edema bilaterally. Skin: No rashes or lesions noted. Lymphatics: No cervical, supraclavicular, or inguinal adenopathy. GU: Normal appearing external genitalia without erythema, excoriation, or lesions.  Speculum exam reveals normal cervix with IUD strings visualized. Bimanual exam reveals normal cervix. Uterus and adnexa unable to be palpated due to body habitus. Exam chaperoned by Kimberly Jordan, CMA    Laboratory & Radiologic Studies: Surgical pathology (02/02/24): A. ENDOMETRIAL, BIOPSY:  - Benign inactive endometrium and pseudodecidualized stroma  - Negative for hyperplasia, atypia or malignancy

## 2024-05-04 ENCOUNTER — Ambulatory Visit: Payer: Self-pay | Admitting: Family Medicine

## 2024-05-04 NOTE — Telephone Encounter (Signed)
 Attempted to call patient. Reached voicemail, left generic voicemail to call back.  If she call back  - blood work looks good - Her lipase (marker of pancreas injury) is very slightly elevated--likely due to recent GI illness - Ask if she is tolerating Mounjaro     Suzann Daring, MD  [facility] Medicine Teaching Service

## 2024-05-05 ENCOUNTER — Other Ambulatory Visit (HOSPITAL_COMMUNITY): Payer: Self-pay

## 2024-05-05 ENCOUNTER — Telehealth: Payer: Self-pay

## 2024-05-05 DIAGNOSIS — R109 Unspecified abdominal pain: Secondary | ICD-10-CM

## 2024-05-05 DIAGNOSIS — E1165 Type 2 diabetes mellitus with hyperglycemia: Secondary | ICD-10-CM

## 2024-05-05 NOTE — Telephone Encounter (Signed)
 Prior authorization submitted for MOUNJARO  2.5MG  to [employer] via Latent.   Key: [id number]

## 2024-05-07 NOTE — Telephone Encounter (Signed)
 Pharmacy Patient Advocate Encounter  Received notification from [employer] that Prior Authorization for MOUNJARO  2.5MG  has been DENIED.  Full denial letter will be uploaded to the media tab. See denial reason below.  we did not see what we need to approve the drug you asked for, (Mounjaro ). We may be able to approve this drug in certain situations (when you have tried two certain other drugs first and they did not work location manager preferred products, such as brand Byetta pen, Trulicity  pen, brand Victoza pen, Ozempic  injection]; or when you cannot use certain other drugs [a clinical reason that preferred products cannot be tried]). We do not see that this applies to you.  PA #/Case ID/Reference #: [id number]

## 2024-05-10 MED ORDER — SEMAGLUTIDE(0.25 OR 0.5MG/DOS) 2 MG/1.5ML ~~LOC~~ SOPN: 0.2500 mg | PEN_INJECTOR | SUBCUTANEOUS | 1 refills | Status: DC

## 2024-05-10 MED ORDER — BD PEN NEEDLE NANO ULTRAFINE 32G X 4 MM MISC: 1 refills | Status: DC

## 2024-05-10 NOTE — Telephone Encounter (Signed)
 Called patient. Confirmed DOB  Called patient to discuss results current symptoms and denial of Mounjaro .  She reports she would very much like to restart Ozempic .  Her lipase was mildly elevated.  We discussed the complications from GLP-1's including cholecystitis and pancreatitis.  She reports that she felt better on the Trulicity .  I sent in Ozempic  for her after lengthy discussion.  Before she starts she will get a right upper quadrant ultrasound and repeat labs.  Right upper quadrant ultrasound has been ordered.  She will call to schedule labs which have been ordered as future.  Follow-up in 1 month for dose increase.

## 2024-05-10 NOTE — Addendum Note (Signed)
 Addended by: DELORES, Brannen Koppen on: 05/10/2024 09:25 AM   Modules accepted: Orders

## 2024-05-17 ENCOUNTER — Telehealth: Payer: Self-pay | Admitting: Pharmacist

## 2024-05-17 NOTE — Telephone Encounter (Signed)
-----   Message from Maude Lagos sent at 04/08/2024  1:57 PM EDT ----- Regarding: FW: Tobacco Cessation - quit date early August Quit for 8 days   10/30   (quit date 10/21)....   Not on tx for  tobacco cessation. ----- Message ----- From: Haydyn Girvan G, RPH-CPP Sent: 04/08/2024  12:00 AM EDT To: Maude KANDICE Lagos, RPH-CPP Subject: FW: Tobacco Cessation - quit date early Augu#  Down to 2 or less per day?   Goal for 03/2024 ----- Message ----- From: Larissa Pegg G, RPH-CPP Sent: 03/09/2024  12:00 AM EDT To: Maude KANDICE Lagos, RPH-CPP Subject: FW: Tobacco Cessation - quit date early Augu#  30 days quit?  Plan to quit on 9/1 ----- Message ----- From: Laneta Guerin G, RPH-CPP Sent: 01/22/2024  12:00 AM EDT To: Maude KANDICE Lagos, RPH-CPP Subject: Tobacco Cessation - quit date early August     Reduced to 4 or less per day by 01/05/2024

## 2024-05-17 NOTE — Telephone Encounter (Signed)
 Patient contacted for follow/up of tobacco cessation attempt.   Since last contact patient reports multiple stressors including mother (aged 62) and brother have had recent strokes.  She is now assisting with continuous care for her mother while she lives in her brother's home with them.  She has relapsed back to smoking ~ 7-8 cigarettes per day due to the stress.  She anticipates quitting over the Christmas/New Years holiday when she will be in [place], living with her fiance.  She knows that she will need to quit while being at his place.  Continues to rates IMPORTANCE of quitting tobacco as high.  Continues to rate CONFIDENCE of quitting tobacco as high.   We agreed that focused effort to quit in a few weeks while away should be our goal.  I also agreed to contact her in early January to discuss progress/ status.   Total time with patient call and documentation of interaction: 9 minutes. Follow-up phone call planned: early January.

## 2024-05-18 NOTE — Telephone Encounter (Signed)
 Reviewed and agree with Dr Rennis plan.

## 2024-05-19 NOTE — Telephone Encounter (Signed)
 Received f/u call from [facility] regarding US  order. They are asking for what provider is wanting assessed during ultrasound.   Advised of reason for exam per note from Dr. Delores.   They will reach out to patient for scheduling.   Chiquita JAYSON English, RN

## 2024-05-20 ENCOUNTER — Other Ambulatory Visit

## 2024-05-20 ENCOUNTER — Encounter: Payer: Self-pay | Admitting: "Endocrinology

## 2024-05-20 ENCOUNTER — Ambulatory Visit: Admitting: "Endocrinology

## 2024-05-20 VITALS — BP 114/80 | HR 82 | Ht 63.0 in | Wt 395.0 lb

## 2024-05-20 DIAGNOSIS — E042 Nontoxic multinodular goiter: Secondary | ICD-10-CM

## 2024-05-20 DIAGNOSIS — F17200 Nicotine dependence, unspecified, uncomplicated: Secondary | ICD-10-CM

## 2024-05-20 NOTE — Progress Notes (Signed)
 Outpatient Endocrinology Note Obadiah Birmingham, MD  05/20/2024   Deborah Benson 1960-09-12 [id number]  Referring Provider: Eldonna Mays, MD Primary Care Provider: Delores Suzann HERO, MD Subjective  No chief complaint on file.   Assessment & Plan  Diagnoses and all orders for this visit:  Multinodular goiter -     Cancel: T4, free -     TSH -     T4, free -     US  THYROID ; Future -     US  THYROID   Smoker    Deborah Benson has never been on thyroid  medication. Patient is currently biochemically euthyroid.  Educated on thyroid  axis.  Recommend the following: Labs today. Repeat lab before next visit or sooner if symptoms of hyperthyroidism or hypothyroidism develop.  Notify us  immediately in case of significant weight gain or loss. Counseled on compliance and follow up needs.  08/29/2022 thyroid  ultrasound ordered by another provider reported multinodular goiter with 2 nodules that were identified for FNA but FNA was seemingly not ordered or performed Patient reports she can no longer feel the nodule Ordered follow-up thyroid  ultrasound, patient open to FNA if needed; explained the procedure for FNA in detail along with handouts for education purposes in case needed to proceed after the thyroid  ultrasound  The patient was counseled on the dangers of tobacco use, and was advised to quit. Reviewed strategies to maximize success, including removing cigarettes and smoking materials from environment, stress management, support of family/friends, written materials and local smoking cessation programs. Encouraged to contact PCP for any help needed such as patches or pills to curb nicotine dependence . Patient understands and agreed    I have reviewed current medications, nurse's notes, allergies, vital signs, past medical and surgical history, family medical history, and social history for this encounter. Counseled patient on symptoms, examination findings, lab findings, imaging  results, treatment decisions and monitoring and prognosis. The patient understood the recommendations and agrees with the treatment plan. All questions regarding treatment plan were fully answered.   Return in about 1 month (around 06/23/2024) for visit, labs today.   Obadiah Birmingham, MD  05/20/2024   I have reviewed current medications, nurse's notes, allergies, vital signs, past medical and surgical history, family medical history, and social history for this encounter. Counseled patient on symptoms, examination findings, lab findings, imaging results, treatment decisions and monitoring and prognosis. The patient understood the recommendations and agrees with the treatment plan. All questions regarding treatment plan were fully answered.   History of Present Illness Deborah Benson is a 63 y.o. year old female who presents to our clinic with multinodular goiter diagnosed in 2024.    Symptoms suggestive of HYPOTHYROIDISM:  fatigue Yes weight gain Yes cold intolerance  No constipation  Yes  Symptoms suggestive of HYPERTHYROIDISM:  weight loss  No heat intolerance No hyperdefecation  No palpitations  No  Compressive symptoms:  dysphagia  No dysphonia  No positional dyspnea (especially with simultaneous arms elevation)  No  Smokes  Yes On biotin  No Personal history of head/neck surgery/irradiation  No  Physical Exam  BP 114/80   Pulse 82   Ht 5' 3 (1.6 m)   Wt (!) 395 lb (179.2 kg)   SpO2 95%   BMI 69.97 kg/m  Constitutional: well developed, well nourished Head: normocephalic, atraumatic, no exophthalmos Eyes: sclera anicteric, no redness Neck: no thyromegaly, no thyroid  tenderness; no nodules palpated Lungs: normal respiratory effort Neurology: alert and oriented, no fine hand tremor Skin: dry, no appreciable  rashes Musculoskeletal: no appreciable defects Psychiatric: normal mood and affect  Allergies Allergies[1]  Current Medications Patient's Medications   New Prescriptions   No medications on file  Previous Medications   ALBUTEROL  (PROVENTIL ) (2.5 MG/3ML) 0.083% NEBULIZER SOLUTION    Take 3 mLs (2.5 mg total) by nebulization every 6 (six) hours as needed for wheezing or shortness of breath.   CHOLECALCIFEROL (VITAMIN D3) 25 MCG (1000 UNIT) TABLET    Take 1,000 Units by mouth daily.   FLUTICASONE  (FLONASE ) 50 MCG/ACT NASAL SPRAY    Place 2 sprays into both nostrils daily.   INSULIN  PEN NEEDLE (BD PEN NEEDLE NANO ULTRAFINE) 32G X 4 MM MISC    Use weekly with ozempic    LETROZOLE  (FEMARA ) 2.5 MG TABLET    Take 2.5 mg by mouth daily.   NYSTATIN  (MYCOSTATIN /NYSTOP ) POWDER    Apply 1 Application topically 3 (three) times daily as needed.   ONDANSETRON  (ZOFRAN -ODT) 4 MG DISINTEGRATING TABLET    Take 1 tablet (4 mg total) by mouth every 8 (eight) hours as needed for nausea.   SEMAGLUTIDE ,0.25 OR 0.5MG /DOS, 2 MG/1.5ML SOPN    Inject 0.25 mg into the skin once a week. 0.25 mg once weekly for 4 weeks then increase to 0.5 mg weekly for at least 4 weeks,max 1 mg  Modified Medications   No medications on file  Discontinued Medications   No medications on file    Past Medical History Past Medical History:  Diagnosis Date   Arthritis    Asthma    Cancer (HCC)    ovarian cancer   Depression    Diabetes mellitus without complication (HCC)    oral meds only   Gout    HTN (hypertension)    borderline med was stopped by pt- MD aware.   Neuromuscular disorder (HCC)    neuropathy -hands/ feet   Neuropathy    Obesity    BMI >76.6   Pneumonia     Past Surgical History Past Surgical History:  Procedure Laterality Date   FOOT SURGERY     due to needle break in foot   HYSTEROSCOPY WITH D & C N/A 06/04/2022   Procedure: DILATATION AND CURETTAGE;  Surgeon: Eldonna Mays, MD;  Location: [facility];  Service: Gynecology;  Laterality: N/A;   INTRAUTERINE DEVICE (IUD) INSERTION N/A 06/04/2022   Procedure: MIRENA  INTRAUTERINE DEVICE (IUD) INSERTION;   Surgeon: Eldonna Mays, MD;  Location: [facility];  Service: Gynecology;  Laterality: N/A;   MULTIPLE TOOTH EXTRACTIONS     TONSILLECTOMY      Family History family history includes Dementia in her maternal grandfather; Diabetes in her brother; Glaucoma in her father and another family member; Gout in her mother; Heart attack in her mother; Heart disease in her sister; Hypertension in her mother; Lung cancer (age of onset: 11 - 22) in her father; Other in her sister; Ovarian cancer in her maternal grandmother; Prostate cancer in her maternal uncle; Seizures in an other family member; Stroke in her brother and mother; Throat cancer in her sister.  Social History Social History   Socioeconomic History   Marital status: Single    Spouse name: Not on file   Number of children: 0   Years of education: Not on file   Highest education level: GED or equivalent  Occupational History   Occupation: disabled  Tobacco Use   Smoking status: Every Day    Current packs/day: 0.40    Average packs/day: 0.4 packs/day for 45.9 years (18.4 ttl pk-yrs)  Types: Cigarettes    Start date: 06/10/1978   Smokeless tobacco: Never   Tobacco comments:    down to 5 per day - Previous 7-10  Vaping Use   Vaping status: Never Used  Substance and Sexual Activity   Alcohol use: No    Alcohol/week: 0.0 standard drinks of alcohol   Drug use: No   Sexual activity: Not Currently  Other Topics Concern   Not on file  Social History Narrative         Past Medical History:      Irregular menstrual periods      Asthma         Social History:      Disabled?,  Weight and OA; Moved to [place] 6 yrs ago.  Single         Social Drivers of Health   Tobacco Use: High Risk (05/20/2024)   Patient History    Smoking Tobacco Use: Every Day    Smokeless Tobacco Use: Never    Passive Exposure: Not on file  Financial Resource Strain: Medium Risk (04/23/2024)   Overall Financial Resource Strain (CARDIA)    Difficulty of  Paying Living Expenses: Somewhat hard  Food Insecurity: Food Insecurity Present (04/23/2024)   Epic    Worried About Programme Researcher, Broadcasting/film/video in the Last Year: Sometimes true    Barista in the Last Year: Often true  Transportation Needs: Unmet Transportation Needs (04/23/2024)   Epic    Lack of Transportation (Medical): Yes    Lack of Transportation (Non-Medical): Yes  Physical Activity: Insufficiently Active (04/23/2024)   Exercise Vital Sign    Days of Exercise per Week: 2 days    Minutes of Exercise per Session: 20 min  Stress: Stress Concern Present (04/23/2024)   [employer] of Occupational Health - Occupational Stress Questionnaire    Feeling of Stress: Very much  Social Connections: Unknown (04/23/2024)   Social Connection and Isolation Panel    Frequency of Communication with Friends and Family: More than three times a week    Frequency of Social Gatherings with Friends and Family: More than three times a week    Attends Religious Services: More than 4 times per year    Active Member of [employer] or Organizations: Yes    Attends Banker Meetings: More than 4 times per year    Marital Status: Patient declined  Intimate Partner Violence: Unknown (11/03/2022)   Received from [facility]   HITS    Physically Hurt: Not on file    Insult or Talk Down To: Not on file    Threaten Physical Harm: Not on file    Scream or Curse: Not on file  Depression (PHQ2-9): Medium Risk (04/30/2024)   Depression (PHQ2-9)    PHQ-2 Score: 8  Alcohol Screen: Low Risk (04/23/2024)   Alcohol Screen    Last Alcohol Screening Score (AUDIT): 0  Housing: Low Risk (04/23/2024)   Epic    Unable to Pay for Housing in the Last Year: No    Number of Times Moved in the Last Year: 0    Homeless in the Last Year: No  Utilities: Not At Risk (08/28/2022)   AHC Utilities    Threatened with loss of utilities: No  Health Literacy: Not on file    Laboratory Investigations Lab Results   Component Value Date   TSH 0.984 09/01/2023   TSH 1.348 06/04/2022   TSH 1.923 08/11/2013     No results found  for: TSI   No components found for: TRAB   Lab Results  Component Value Date   CHOL 161 04/30/2024   Lab Results  Component Value Date   HDL 40 04/30/2024   Lab Results  Component Value Date   LDLCALC 103 (H) 04/30/2024   Lab Results  Component Value Date   TRIG 99 04/30/2024   Lab Results  Component Value Date   CHOLHDL 4.0 04/30/2024   Lab Results  Component Value Date   CREATININE 0.91 04/30/2024   No results found for: GFR    Component Value Date/Time   NA 137 04/30/2024 1108   K 4.1 04/30/2024 1108   CL 99 04/30/2024 1108   CO2 25 04/30/2024 1108   GLUCOSE 190 (H) 04/30/2024 1108   GLUCOSE 253 (H) 11/22/2022 0942   BUN 11 04/30/2024 1108   CREATININE 0.91 04/30/2024 1108   CREATININE 0.83 11/22/2022 0942   CREATININE 0.87 10/25/2015 1223   CALCIUM 9.4 04/30/2024 1108   PROT 7.7 04/30/2024 1108   ALBUMIN 3.6 (L) 04/30/2024 1108   AST 12 04/30/2024 1108   AST 12 (L) 11/22/2022 0942   ALT 9 04/30/2024 1108   ALT 8 11/22/2022 0942   ALKPHOS 118 04/30/2024 1108   BILITOT 0.2 04/30/2024 1108   BILITOT 0.3 11/22/2022 0942   GFRNONAA >60 11/22/2022 0942   GFRAA 104 12/07/2019 1553      Latest Ref Rng & Units 04/30/2024   11:08 AM 12/16/2023    3:20 PM 09/01/2023   11:08 AM  BMP  Glucose 70 - 99 mg/dL 809   881   BUN 8 - 27 mg/dL 11   10   Creatinine 9.42 - 1.00 mg/dL 9.08  9.09  9.10   BUN/Creat Ratio 12 - 28 12   11    Sodium 134 - 144 mmol/L 137   142   Potassium 3.5 - 5.2 mmol/L 4.1   4.9   Chloride 96 - 106 mmol/L 99   100   CO2 20 - 29 mmol/L 25   23   Calcium 8.7 - 10.3 mg/dL 9.4   9.4        Component Value Date/Time   WBC 5.5 04/30/2024 1108   WBC 7.3 11/22/2022 0942   WBC 11.3 (H) 06/08/2022 0535   RBC 4.43 04/30/2024 1108   RBC 5.00 11/22/2022 0942   HGB 12.5 04/30/2024 1108   HCT 39.4 04/30/2024 1108   PLT 265  04/30/2024 1108   MCV 89 04/30/2024 1108   MCH 28.2 04/30/2024 1108   MCH 24.2 (L) 11/22/2022 0942   MCHC 31.7 04/30/2024 1108   MCHC 29.5 (L) 11/22/2022 0942   RDW 15.9 (H) 04/30/2024 1108   LYMPHSABS 2.2 11/22/2022 0942   LYMPHSABS 2.1 10/09/2022 1439   MONOABS 0.4 11/22/2022 0942   EOSABS 0.1 11/22/2022 0942   EOSABS 0.1 10/09/2022 1439   BASOSABS 0.0 11/22/2022 0942   BASOSABS 0.0 10/09/2022 1439      Parts of this note may have been dictated using voice recognition software. There may be variances in spelling and vocabulary which are unintentional. Not all errors are proofread. Please notify the dino if any discrepancies are noted or if the meaning of any statement is not clear.       [1]  Allergies Allergen Reactions   Penicillins Hives    Denies airway involvement   Metformin  And Related Other (See Comments)    Visual disturbances - foggy vision

## 2024-05-21 LAB — T4, FREE: Free T4: 1.1 ng/dL (ref 0.8–1.8)

## 2024-05-21 LAB — TSH: TSH: 0.97 m[IU]/L (ref 0.40–4.50)

## 2024-05-25 ENCOUNTER — Inpatient Hospital Stay: Admission: RE | Admit: 2024-05-25 | Discharge: 2024-05-25 | Attending: Family Medicine

## 2024-05-25 DIAGNOSIS — R109 Unspecified abdominal pain: Secondary | ICD-10-CM

## 2024-05-27 ENCOUNTER — Ambulatory Visit: Payer: Self-pay | Admitting: Family Medicine

## 2024-06-07 ENCOUNTER — Telehealth: Payer: Self-pay

## 2024-06-07 DIAGNOSIS — E1165 Type 2 diabetes mellitus with hyperglycemia: Secondary | ICD-10-CM

## 2024-06-07 DIAGNOSIS — B379 Candidiasis, unspecified: Secondary | ICD-10-CM

## 2024-06-07 MED ORDER — SEMAGLUTIDE(0.25 OR 0.5MG/DOS) 2 MG/1.5ML ~~LOC~~ SOPN: 0.2500 mg | PEN_INJECTOR | SUBCUTANEOUS | 1 refills | Status: DC

## 2024-06-07 MED ORDER — NYSTATIN 100000 UNIT/GM EX POWD: 1.0000 | Freq: Three times a day (TID) | CUTANEOUS | 5 refills | Status: AC | PRN

## 2024-06-07 MED ORDER — FLUTICASONE PROPIONATE 50 MCG/ACT NA SUSP: 2.0000 | Freq: Every day | NASAL | 6 refills | Status: AC

## 2024-06-07 MED ORDER — ONDANSETRON 4 MG PO TBDP: 4.0000 mg | ORAL_TABLET | Freq: Three times a day (TID) | ORAL | 0 refills | Status: AC | PRN

## 2024-06-07 MED ORDER — BD PEN NEEDLE NANO ULTRAFINE 32G X 4 MM MISC: 1 refills | Status: AC

## 2024-06-07 NOTE — Telephone Encounter (Signed)
 Rx sent to pharmacy

## 2024-06-07 NOTE — Telephone Encounter (Signed)
 Patient calls nurse line requesting pharmacy change.   She reports going forward she will no longer be using [facility] and reports [facility] now.   She reports she needs all prescription sent to [facility]. She reports she has not picked up prescriptions in months due to health concern.   I called [facility] and cancelled prescriptions.   Please send to [facility].

## 2024-06-09 ENCOUNTER — Telehealth: Payer: Self-pay

## 2024-06-09 NOTE — Telephone Encounter (Signed)
 Pharmacy Patient Advocate Encounter   Received notification from CoverMyMeds that prior authorization for OZEMPIC  0.25/0.5MG  is required/requested.   Insurance verification completed.   The patient is insured through [employer].   PA required; PA submitted to above mentioned insurance via Latent Key/confirmation #/EOC [id number]. Status is pending

## 2024-06-11 NOTE — Telephone Encounter (Signed)
 Pharmacy Patient Advocate Encounter  Received notification from [employer] that Prior Authorization for OZEMPIC  0.25/0.5MG  has been APPROVED from 06/09/24 to 06/09/25   PA #/Case ID/Reference #: [id number]

## 2024-06-14 ENCOUNTER — Other Ambulatory Visit (HOSPITAL_COMMUNITY): Payer: Self-pay

## 2024-06-14 ENCOUNTER — Telehealth: Payer: Self-pay

## 2024-06-14 ENCOUNTER — Telehealth: Payer: Self-pay | Admitting: Pharmacist

## 2024-06-14 NOTE — Telephone Encounter (Signed)
 Returned call to patient.   She reports that she has been having flu like symptoms for the last week.   Has been doing breathing treatments and inhalers every 4-6 hours with improvement in her breathing.   Due to continued issues, patient requests to schedule appointment. Scheduled appt for tomorrow morning in [facility] at 1050.  Strict ED precautions discussed.   Chiquita JAYSON English, RN

## 2024-06-14 NOTE — Telephone Encounter (Signed)
 Patient contacted for follow/up of tobacco cessation attempt.    Since last contact patient reports multiple stressors including mother (aged 63) needing to be re-hospitalized for dry kidney's.  She is now assisting with continuous care for her mother as she has returned home recently.  She had relapsed back to smoking ~ 7-8 cigarettes per day due to the stress BUT has reduced to 2-3 per day during her most recent illness - FLU for the last several days.   Did not get to leave town for the holidays as planned (previous goal to quit by this trip). Continues to rates IMPORTANCE of quitting tobacco as high.  Continues to rate CONFIDENCE of quitting tobacco as high.    We agreed that focused effort to quit iby Valentine's day as the new goal.  I also agreed to contact her in mid-February to discuss progress/ status.

## 2024-06-14 NOTE — Telephone Encounter (Signed)
 Pt need PA done for Semaglutide ,0.25 or 0.5MG /DOS, 2 MG/1.5ML SOPN

## 2024-06-14 NOTE — Telephone Encounter (Signed)
 Please clarify duration of influenza symptoms. She would be candidate for Tamiflu if last 48 hours.  Suzann Daring, MD  Family Medicine Teaching Service

## 2024-06-14 NOTE — Telephone Encounter (Signed)
-----   Message from Maude Lagos sent at 05/17/2024 12:27 PM EST ----- Regarding: FW: Tobacco Cessation - quit date early August Contact to determine smoking status after spending holiday with fiance (non-smoker) in [place].  Had relaped back to 7 per day. ----- Message ----- From: Vibhav Waddill G, RPH-CPP Sent: 05/03/2024  12:00 AM EST To: Maude KANDICE Lagos, RPH-CPP Subject: FW: Tobacco Cessation - quit date early Augu#  Quit for 8 days   10/30   (quit date 10/21)....   Not on tx for  tobacco cessation. ----- Message ----- From: Wilna Pennie G, RPH-CPP Sent: 04/08/2024  12:00 AM EDT To: Maude KANDICE Lagos, RPH-CPP Subject: FW: Tobacco Cessation - quit date early Augu#  Down to 2 or less per day?   Goal for 03/2024 ----- Message ----- From: Osceola Holian G, RPH-CPP Sent: 03/09/2024  12:00 AM EDT To: Maude KANDICE Lagos, RPH-CPP Subject: FW: Tobacco Cessation - quit date early Augu#  30 days quit?  Plan to quit on 9/1 ----- Message ----- From: Erinn Huskins G, RPH-CPP Sent: 01/22/2024  12:00 AM EDT To: Maude KANDICE Lagos, RPH-CPP Subject: Tobacco Cessation - quit date early August     Reduced to 4 or less per day by 01/05/2024

## 2024-06-14 NOTE — Telephone Encounter (Signed)
 Pharmacy Patient Advocate Encounter   Received notification from Pt Calls Messages that prior authorization for Ozempic  (0.25 or 0.5 MG/DOSE) 2MG /3ML pen-injectors  is required/requested.   Insurance verification completed.   The patient is insured through [employer].   Per test claim: PA required; PA submitted to above mentioned insurance via CoverMyMeds Key/confirmation #/EOC [id number] Status is pending

## 2024-06-14 NOTE — Telephone Encounter (Signed)
 Reviewed and agree with Dr Rennis plan.

## 2024-06-15 ENCOUNTER — Ambulatory Visit: Admitting: Student

## 2024-06-15 VITALS — BP 152/86 | HR 66 | Ht 63.0 in | Wt 396.4 lb

## 2024-06-15 DIAGNOSIS — E1165 Type 2 diabetes mellitus with hyperglycemia: Secondary | ICD-10-CM | POA: Diagnosis not present

## 2024-06-15 DIAGNOSIS — R051 Acute cough: Secondary | ICD-10-CM | POA: Diagnosis present

## 2024-06-15 DIAGNOSIS — I10 Essential (primary) hypertension: Secondary | ICD-10-CM

## 2024-06-15 LAB — POC SOFIA 2 FLU + SARS ANTIGEN FIA
Influenza A, POC: NEGATIVE
Influenza B, POC: NEGATIVE
SARS Coronavirus 2 Ag: NEGATIVE

## 2024-06-15 MED ORDER — SEMAGLUTIDE(0.25 OR 0.5MG/DOS) 2 MG/1.5ML ~~LOC~~ SOPN: 0.2500 mg | PEN_INJECTOR | SUBCUTANEOUS | 1 refills | Status: AC

## 2024-06-15 MED ORDER — PREDNISONE 20 MG PO TABS: 40.0000 mg | ORAL_TABLET | Freq: Every day | ORAL | 0 refills | Status: AC

## 2024-06-15 MED ORDER — AZITHROMYCIN 500 MG PO TABS: 500.0000 mg | ORAL_TABLET | Freq: Every day | ORAL | 0 refills | Status: AC

## 2024-06-15 MED ORDER — ALBUTEROL SULFATE (2.5 MG/3ML) 0.083% IN NEBU
2.5000 mg | INHALATION_SOLUTION | Freq: Once | RESPIRATORY_TRACT | Status: AC
  Administered 2024-06-15: 2.5 mg via RESPIRATORY_TRACT

## 2024-06-15 MED ORDER — IPRATROPIUM BROMIDE 0.02 % IN SOLN
0.5000 mg | Freq: Once | RESPIRATORY_TRACT | Status: AC
  Administered 2024-06-15: 0.5 mg via RESPIRATORY_TRACT

## 2024-06-15 MED ORDER — ALBUTEROL SULFATE HFA 108 (90 BASE) MCG/ACT IN AERS: 2.0000 | INHALATION_SPRAY | RESPIRATORY_TRACT | 1 refills | Status: AC | PRN

## 2024-06-15 NOTE — Patient Instructions (Addendum)
 It was great to see you! Thank you for allowing me to participate in your care!   I recommend that you always bring your medications to each appointment as this makes it easy to ensure we are on the correct medications and helps us  not miss when refills are needed.  Our plans for today:  - Take 40 mg (2 tablets) prednisone  once daily with breakfast for 5 days - Take 500 mg (1 tablet) of azithromycin  once daily for 3 days - Take 2 puffs of your albuterol  inhaler every 4 hours as needed for shortness of breath and cough - Please make appointment to follow-up with your PCP to discuss your chronic medical conditions and annual exam -I recommend follow-up with PCP within the next 2 weeks to discuss elevated blood pressure - Additionally recommend following up with your pulmonologist [phone]   Take care and seek immediate care sooner if you develop any concerns. Please remember to show up 15 minutes before your scheduled appointment time!  [name], DO [facility]

## 2024-06-15 NOTE — Progress Notes (Signed)
" ° ° °  SUBJECTIVE:   CHIEF COMPLAINT / HPI:   Discussed the use of AI scribe software for clinical note transcription with the patient, who gave verbal consent to proceed.  History of Present Illness Deborah Benson is a 64 year old female with asthma and possible COPD who presents with flu-like symptoms and wheezing.  Acute on chronic cough - Flu-like symptoms for 1 week - Body aches, cough, sore throat present - Initial nausea and vomiting after flu exposure, now resolved - Wheezing present - Asthma diagnosed approximately 15 years ago - Albuterol  inhaler provides partial relief of symptoms - Smokes 6 to 7 cigarettes per day - Attempting to quit smoking  Diabetes - Patient transitioning from Trulicity  to Ozempic .  Has been out of Trulicity  for 6 weeks.  Unable to pick up Ozempic , due to prior authorization and then no prescription at her pharmacy.  OBJECTIVE:   BP (!) 152/86   Pulse 66   Ht 5' 3 (1.6 m)   Wt (!) 396 lb 6.4 oz (179.8 kg)   SpO2 98%   BMI 70.22 kg/m    General: NAD, pleasant Cardio: RRR, no MRG. Respiratory: Bilateral expiratory wheezing, prolonged expiratory phase, speaking in full sentences Skin: Warm and dry  ASSESSMENT/PLAN:   Assessment & Plan Acute cough - COVID/flu negative - DuoNebs in office today, with improvement - Given history, and wheezing on exam will treat as COPD exacerbation - Azithromycin  500 mg 2-day course, prednisone  40 mg daily 5-day course, albuterol  inhaler - Recommend follow-up with PCP once acute symptoms resolve and follow-up with pulmonology given chronic coughing and shortness of breath Type 2 diabetes mellitus with hyperglycemia, without long-term current use of insulin  (HCC) - Ozempic  0.25 mg for 4 weeks, then titrate to 0.5 mg for 4 weeks - Follow-up with PCP Primary hypertension - Poorly controlled today, appears prior dizziness with olmesartan  - Recommend follow-up with PCP in the next 2 weeks or sooner pending  availability    [name], DO [facility]  "

## 2024-06-15 NOTE — Assessment & Plan Note (Signed)
-   Poorly controlled today, appears prior dizziness with olmesartan  - Recommend follow-up with PCP in the next 2 weeks or sooner pending availability

## 2024-06-15 NOTE — Assessment & Plan Note (Signed)
-   Ozempic  0.25 mg for 4 weeks, then titrate to 0.5 mg for 4 weeks - Follow-up with PCP

## 2024-06-23 ENCOUNTER — Ambulatory Visit: Admitting: "Endocrinology

## 2024-07-05 ENCOUNTER — Other Ambulatory Visit (HOSPITAL_COMMUNITY): Payer: Self-pay

## 2024-07-05 NOTE — Telephone Encounter (Signed)
 Pharmacy Patient Advocate Encounter  Received notification from [employer] that Prior Authorization for Ozempic  (0.25 or 0.5 MG/DOSE) 2MG /3ML pen-injectors  has been CANCELLED due to No PA needed. Previous PA expires 06/09/25. See Encounter from 06/09/24.

## 2024-07-08 ENCOUNTER — Encounter: Payer: Self-pay | Admitting: "Endocrinology

## 2024-07-08 ENCOUNTER — Telehealth: Admitting: "Endocrinology

## 2024-07-08 ENCOUNTER — Ambulatory Visit: Admitting: "Endocrinology

## 2024-07-08 VITALS — Ht 63.0 in | Wt 388.0 lb

## 2024-07-08 DIAGNOSIS — F17200 Nicotine dependence, unspecified, uncomplicated: Secondary | ICD-10-CM

## 2024-07-08 DIAGNOSIS — E042 Nontoxic multinodular goiter: Secondary | ICD-10-CM

## 2024-07-08 NOTE — Progress Notes (Addendum)
 "  The patient reports they are currently: Deborah Benson. This was a video visit with the patient on the date of service.  The patient was physically located in [place]  or a state in which I am permitted to provide care. The patient and/or parent/guardian understood that s/he may incur co-pays and cost sharing, and agreed to the telemedicine visit. The visit was reasonable and appropriate under the circumstances given the patient's presentation at the time.  The patient and/or parent/guardian understands the potential risks and limitations of this mode of treatment (including, but not limited to, the absence of in-person examination) and has agreed to be treated using telemedicine. The patient's/patient's family's questions regarding telemedicine have been answered.   The patient and/or parent/guardian will contact their provider's office for worsening conditions, and seek emergency medical treatment and/or call 911 if the patient deems either necessary.     Outpatient Endocrinology Note Deborah Birmingham, MD  07/08/24   Deborah Benson 1961/05/01 [id number]  Referring Provider: Delores Suzann HERO, MD Primary Care Provider: Delores Suzann HERO, MD Subjective  Chief Complaint  Patient presents with   Diabetes   Thyroid  Problem    Assessment & Plan  Deborah Benson was seen today for diabetes and thyroid  problem.  Diagnoses and all orders for this visit:  Multinodular goiter -     TSH + free T4  Smoker    Deborah Benson has never been on thyroid  medication. Patient is currently biochemically euthyroid.  Educated on thyroid  axis.  Recommend the following: 05/20/24:  Labs WNL Repeat lab before next visit or sooner if symptoms of hyperthyroidism or hypothyroidism develop.  Notify us  immediately in case of significant weight gain or loss. Counseled on compliance and follow up needs.  08/29/2022 thyroid  ultrasound ordered by another provider reported multinodular goiter with 2 nodules that were  identified for FNA but FNA was seemingly not ordered or performed Patient reports she can no longer feel the nodule Ordered follow-up thyroid  ultrasound, patient open to FNA if needed; explained the procedure for FNA in detail along with handouts for education purposes in case needed to proceed after the thyroid  ultrasound US  thyroid  is scheduled for 07/17/24   The patient was counseled on the dangers of tobacco use, and was advised to quit. Reviewed strategies to maximize success, including removing cigarettes and smoking materials from environment, stress management, support of family/friends, written materials and local smoking cessation programs. Encouraged to contact PCP for any help needed such as patches or pills to curb nicotine dependence. Patient understands and agreed    I have reviewed current medications, nurse's notes, allergies, vital signs, past medical and surgical history, family medical history, and social history for this encounter. Counseled patient on symptoms, examination findings, lab findings, imaging results, treatment decisions and monitoring and prognosis. The patient understood the recommendations and agrees with the treatment plan. All questions regarding treatment plan were fully answered.   Return in about 1 year (around 07/08/2025) for visit + labs before next visit.   Deborah Birmingham, MD  07/08/24   I have reviewed current medications, nurse's notes, allergies, vital signs, past medical and surgical history, family medical history, and social history for this encounter. Counseled patient on symptoms, examination findings, lab findings, imaging results, treatment decisions and monitoring and prognosis. The patient understood the recommendations and agrees with the treatment plan. All questions regarding treatment plan were fully answered.   History of Present Illness Deborah Benson is a 64 y.o. year old female who presents to  our clinic with multinodular goiter  diagnosed in 2024.    Just recovering from flu Patient is actively working on quitting smoking   Symptoms suggestive of HYPOTHYROIDISM:  fatigue Yes weight gain No, just started ozempic  by another provider cold intolerance  No constipation  Yes  Symptoms suggestive of HYPERTHYROIDISM:  weight loss  No heat intolerance No hyperdefecation  No palpitations  No  Compressive symptoms:  dysphagia  No dysphonia  No positional dyspnea (especially with simultaneous arms elevation)  No  Smokes  Yes On biotin  No Personal history of head/neck surgery/irradiation  No  Physical Exam  Ht 5' 3 (1.6 m)   Wt (!) 388 lb (176 kg)   BMI 68.73 kg/m  Constitutional: well developed, well nourished Head: normocephalic, atraumatic, no exophthalmos Eyes: sclera anicteric, no redness Neck: thyroid  exam limited due to poor video quality  Lungs: normal respiratory effort Neurology: alert and oriented, no fine hand tremor Skin: dry, no appreciable rashes Musculoskeletal: no appreciable defects Psychiatric: normal mood and affect  Allergies Allergies[1]  Current Medications Patient's Medications  New Prescriptions   No medications on file  Previous Medications   ALBUTEROL  (PROVENTIL ) (2.5 MG/3ML) 0.083% NEBULIZER SOLUTION    Take 3 mLs (2.5 mg total) by nebulization every 6 (six) hours as needed for wheezing or shortness of breath.   ALBUTEROL  (VENTOLIN  HFA) 108 (90 BASE) MCG/ACT INHALER    Inhale 2 puffs into the lungs every 4 (four) hours as needed for wheezing or shortness of breath.   AZITHROMYCIN  (ZITHROMAX ) 500 MG TABLET    Take 1 tablet (500 mg total) by mouth daily.   CHOLECALCIFEROL (VITAMIN D3) 25 MCG (1000 UNIT) TABLET    Take 1,000 Units by mouth daily.   FLUTICASONE  (FLONASE ) 50 MCG/ACT NASAL SPRAY    Place 2 sprays into both nostrils daily.   INSULIN  PEN NEEDLE (BD PEN NEEDLE NANO ULTRAFINE) 32G X 4 MM MISC    Use weekly with ozempic    LETROZOLE  (FEMARA ) 2.5 MG TABLET    Take  2.5 mg by mouth daily.   NYSTATIN  (MYCOSTATIN /NYSTOP ) POWDER    Apply 1 Application topically 3 (three) times daily as needed.   ONDANSETRON  (ZOFRAN -ODT) 4 MG DISINTEGRATING TABLET    Take 1 tablet (4 mg total) by mouth every 8 (eight) hours as needed for nausea.   SEMAGLUTIDE ,0.25 OR 0.5MG /DOS, 2 MG/1.5ML SOPN    Inject 0.25 mg into the skin once a week. 0.25 mg once weekly for 4 weeks then increase to 0.5 mg weekly for at least 4 weeks,max 1 mg  Modified Medications   No medications on file  Discontinued Medications   No medications on file    Past Medical History Past Medical History:  Diagnosis Date   Arthritis    Asthma    Cancer (HCC)    ovarian cancer   Depression    Diabetes mellitus without complication (HCC)    oral meds only   Gout    HTN (hypertension)    borderline med was stopped by pt- MD aware.   Neuromuscular disorder (HCC)    neuropathy -hands/ feet   Neuropathy    Obesity    BMI >76.6   Pneumonia     Past Surgical History Past Surgical History:  Procedure Laterality Date   FOOT SURGERY     due to needle break in foot   HYSTEROSCOPY WITH D & C N/A 06/04/2022   Procedure: DILATATION AND CURETTAGE;  Surgeon: Eldonna Mays, MD;  Location: [facility];  Service: Gynecology;  Laterality: N/A;   INTRAUTERINE DEVICE (IUD) INSERTION N/A 06/04/2022   Procedure: MIRENA  INTRAUTERINE DEVICE (IUD) INSERTION;  Surgeon: Eldonna Mays, MD;  Location: [facility];  Service: Gynecology;  Laterality: N/A;   MULTIPLE TOOTH EXTRACTIONS     TONSILLECTOMY      Family History family history includes Dementia in her maternal grandfather; Diabetes in her brother; Glaucoma in her father and another family member; Gout in her mother; Heart attack in her mother; Heart disease in her sister; Hypertension in her mother; Lung cancer (age of onset: 64 - 42) in her father; Other in her sister; Ovarian cancer in her maternal grandmother; Prostate cancer in her maternal uncle; Seizures in an  other family member; Stroke in her brother and mother; Throat cancer in her sister.  Social History Social History   Socioeconomic History   Marital status: Single    Spouse name: Not on file   Number of children: 0   Years of education: Not on file   Highest education level: GED or equivalent  Occupational History   Occupation: disabled  Tobacco Use   Smoking status: Every Day    Current packs/day: 0.40    Average packs/day: 0.4 packs/day for 46.1 years (18.4 ttl pk-yrs)    Types: Cigarettes    Start date: 06/10/1978   Smokeless tobacco: Never   Tobacco comments:    down to 5 per day - Previous 7-10  Vaping Use   Vaping status: Never Used  Substance and Sexual Activity   Alcohol use: No    Alcohol/week: 0.0 standard drinks of alcohol   Drug use: No   Sexual activity: Not Currently  Other Topics Concern   Not on file  Social History Narrative         Past Medical History:      Irregular menstrual periods      Asthma         Social History:      Disabled?,  Weight and OA; Moved to [place] 6 yrs ago.  Single         Social Drivers of Health   Tobacco Use: High Risk (07/08/2024)   Patient History    Smoking Tobacco Use: Every Day    Smokeless Tobacco Use: Never    Passive Exposure: Not on file  Financial Resource Strain: Medium Risk (04/23/2024)   Overall Financial Resource Strain (CARDIA)    Difficulty of Paying Living Expenses: Somewhat hard  Food Insecurity: Food Insecurity Present (04/23/2024)   Epic    Worried About Programme Researcher, Broadcasting/film/video in the Last Year: Sometimes true    Ran Out of Food in the Last Year: Often true  Transportation Needs: Unmet Transportation Needs (04/23/2024)   Epic    Lack of Transportation (Medical): Yes    Lack of Transportation (Non-Medical): Yes  Physical Activity: Insufficiently Active (04/23/2024)   Exercise Vital Sign    Days of Exercise per Week: 2 days    Minutes of Exercise per Session: 20 min  Stress: Stress Concern Present  (04/23/2024)   [employer] of Occupational Health - Occupational Stress Questionnaire    Feeling of Stress: Very much  Social Connections: Unknown (04/23/2024)   Social Connection and Isolation Panel    Frequency of Communication with Friends and Family: More than three times a week    Frequency of Social Gatherings with Friends and Family: More than three times a week    Attends Religious Services: More than 4 times per year  Active Member of Clubs or Organizations: Yes    Attends Banker Meetings: More than 4 times per year    Marital Status: Patient declined  Intimate Partner Violence: Unknown (11/03/2022)   Received from [facility]   HITS    Physically Hurt: Not on file    Insult or Talk Down To: Not on file    Threaten Physical Harm: Not on file    Scream or Curse: Not on file  Depression (PHQ2-9): Low Risk (06/15/2024)   Depression (PHQ2-9)    PHQ-2 Score: 4  Recent Concern: Depression (PHQ2-9) - Medium Risk (04/30/2024)   Depression (PHQ2-9)    PHQ-2 Score: 8  Alcohol Screen: Low Risk (04/23/2024)   Alcohol Screen    Last Alcohol Screening Score (AUDIT): 0  Housing: Low Risk (04/23/2024)   Epic    Unable to Pay for Housing in the Last Year: No    Number of Times Moved in the Last Year: 0    Homeless in the Last Year: No  Utilities: Not At Risk (08/28/2022)   AHC Utilities    Threatened with loss of utilities: No  Health Literacy: Not on file    Laboratory Investigations Lab Results  Component Value Date   TSH 0.97 05/20/2024   TSH 0.984 09/01/2023   TSH 1.348 06/04/2022   FREET4 1.1 05/20/2024     No results found for: TSI   No components found for: TRAB   Lab Results  Component Value Date   CHOL 161 04/30/2024   Lab Results  Component Value Date   HDL 40 04/30/2024   Lab Results  Component Value Date   LDLCALC 103 (H) 04/30/2024   Lab Results  Component Value Date   TRIG 99 04/30/2024   Lab Results  Component Value Date    CHOLHDL 4.0 04/30/2024   Lab Results  Component Value Date   CREATININE 0.91 04/30/2024   No results found for: GFR    Component Value Date/Time   NA 137 04/30/2024 1108   K 4.1 04/30/2024 1108   CL 99 04/30/2024 1108   CO2 25 04/30/2024 1108   GLUCOSE 190 (H) 04/30/2024 1108   GLUCOSE 253 (H) 11/22/2022 0942   BUN 11 04/30/2024 1108   CREATININE 0.91 04/30/2024 1108   CREATININE 0.83 11/22/2022 0942   CREATININE 0.87 10/25/2015 1223   CALCIUM 9.4 04/30/2024 1108   PROT 7.7 04/30/2024 1108   ALBUMIN 3.6 (L) 04/30/2024 1108   AST 12 04/30/2024 1108   AST 12 (L) 11/22/2022 0942   ALT 9 04/30/2024 1108   ALT 8 11/22/2022 0942   ALKPHOS 118 04/30/2024 1108   BILITOT 0.2 04/30/2024 1108   BILITOT 0.3 11/22/2022 0942   GFRNONAA >60 11/22/2022 0942   GFRAA 104 12/07/2019 1553      Latest Ref Rng & Units 04/30/2024   11:08 AM 12/16/2023    3:20 PM 09/01/2023   11:08 AM  BMP  Glucose 70 - 99 mg/dL 809   881   BUN 8 - 27 mg/dL 11   10   Creatinine 9.42 - 1.00 mg/dL 9.08  9.09  9.10   BUN/Creat Ratio 12 - 28 12   11    Sodium 134 - 144 mmol/L 137   142   Potassium 3.5 - 5.2 mmol/L 4.1   4.9   Chloride 96 - 106 mmol/L 99   100   CO2 20 - 29 mmol/L 25   23   Calcium 8.7 - 10.3 mg/dL  9.4   9.4        Component Value Date/Time   WBC 5.5 04/30/2024 1108   WBC 7.3 11/22/2022 0942   WBC 11.3 (H) 06/08/2022 0535   RBC 4.43 04/30/2024 1108   RBC 5.00 11/22/2022 0942   HGB 12.5 04/30/2024 1108   HCT 39.4 04/30/2024 1108   PLT 265 04/30/2024 1108   MCV 89 04/30/2024 1108   MCH 28.2 04/30/2024 1108   MCH 24.2 (L) 11/22/2022 0942   MCHC 31.7 04/30/2024 1108   MCHC 29.5 (L) 11/22/2022 0942   RDW 15.9 (H) 04/30/2024 1108   LYMPHSABS 2.2 11/22/2022 0942   LYMPHSABS 2.1 10/09/2022 1439   MONOABS 0.4 11/22/2022 0942   EOSABS 0.1 11/22/2022 0942   EOSABS 0.1 10/09/2022 1439   BASOSABS 0.0 11/22/2022 0942   BASOSABS 0.0 10/09/2022 1439      Parts of this note may have been  dictated using voice recognition software. There may be variances in spelling and vocabulary which are unintentional. Not all errors are proofread. Please notify the dino if any discrepancies are noted or if the meaning of any statement is not clear.       [1]  Allergies Allergen Reactions   Penicillins Hives    Denies airway involvement   Metformin  And Related Other (See Comments)    Visual disturbances - foggy vision   "

## 2024-07-27 ENCOUNTER — Ambulatory Visit: Admitting: Pulmonary Disease

## 2024-08-02 ENCOUNTER — Inpatient Hospital Stay: Attending: Psychiatry | Admitting: Psychiatry
